# Patient Record
Sex: Female | Born: 1975 | Race: White | Hispanic: No | Marital: Married | Smoking: Former smoker
Health system: Southern US, Community
[De-identification: ages and names within clinical notes are randomized; demographics above are authoritative.]

## PROBLEM LIST (undated history)

## (undated) DIAGNOSIS — G43109 Migraine with aura, not intractable, without status migrainosus: Secondary | ICD-10-CM

## (undated) DIAGNOSIS — E669 Obesity, unspecified: Secondary | ICD-10-CM

## (undated) DIAGNOSIS — M1990 Unspecified osteoarthritis, unspecified site: Secondary | ICD-10-CM

## (undated) DIAGNOSIS — K219 Gastro-esophageal reflux disease without esophagitis: Secondary | ICD-10-CM

## (undated) DIAGNOSIS — D229 Melanocytic nevi, unspecified: Secondary | ICD-10-CM

## (undated) DIAGNOSIS — F329 Major depressive disorder, single episode, unspecified: Secondary | ICD-10-CM

## (undated) DIAGNOSIS — M170 Bilateral primary osteoarthritis of knee: Secondary | ICD-10-CM

## (undated) DIAGNOSIS — F32A Depression, unspecified: Secondary | ICD-10-CM

## (undated) DIAGNOSIS — R87619 Unspecified abnormal cytological findings in specimens from cervix uteri: Secondary | ICD-10-CM

## (undated) DIAGNOSIS — IMO0002 Reserved for concepts with insufficient information to code with codable children: Secondary | ICD-10-CM

## (undated) DIAGNOSIS — L988 Other specified disorders of the skin and subcutaneous tissue: Secondary | ICD-10-CM

## (undated) DIAGNOSIS — Z1589 Genetic susceptibility to other disease: Secondary | ICD-10-CM

## (undated) DIAGNOSIS — D369 Benign neoplasm, unspecified site: Secondary | ICD-10-CM

## (undated) HISTORY — DX: Bilateral primary osteoarthritis of knee: M17.0

## (undated) HISTORY — DX: Major depressive disorder, single episode, unspecified: F32.9

## (undated) HISTORY — DX: Reserved for concepts with insufficient information to code with codable children: IMO0002

## (undated) HISTORY — DX: Benign neoplasm, unspecified site: D36.9

## (undated) HISTORY — DX: Obesity, unspecified: E66.9

## (undated) HISTORY — DX: Depression, unspecified: F32.A

## (undated) HISTORY — DX: Other specified disorders of the skin and subcutaneous tissue: L98.8

## (undated) HISTORY — DX: Migraine with aura, not intractable, without status migrainosus: G43.109

## (undated) HISTORY — PX: OTHER SURGICAL HISTORY: SHX169

## (undated) HISTORY — PX: ORIF FINGER FRACTURE: SHX2122

## (undated) HISTORY — DX: Genetic susceptibility to other disease: Z15.89

## (undated) HISTORY — DX: Unspecified osteoarthritis, unspecified site: M19.90

## (undated) HISTORY — PX: TUBAL LIGATION: SHX77

## (undated) HISTORY — PX: COLONOSCOPY: SHX174

## (undated) HISTORY — DX: Unspecified abnormal cytological findings in specimens from cervix uteri: R87.619

---
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
## 2006-08-22 HISTORY — PX: CHOLECYSTECTOMY: SHX55

## 2009-05-12 DIAGNOSIS — D369 Benign neoplasm, unspecified site: Secondary | ICD-10-CM

## 2009-05-12 DIAGNOSIS — F53 Postpartum depression: Secondary | ICD-10-CM | POA: Insufficient documentation

## 2009-05-12 HISTORY — DX: Benign neoplasm, unspecified site: D36.9

## 2009-10-05 DIAGNOSIS — G5600 Carpal tunnel syndrome, unspecified upper limb: Secondary | ICD-10-CM | POA: Insufficient documentation

## 2009-10-05 DIAGNOSIS — D229 Melanocytic nevi, unspecified: Secondary | ICD-10-CM | POA: Insufficient documentation

## 2009-10-05 DIAGNOSIS — F329 Major depressive disorder, single episode, unspecified: Secondary | ICD-10-CM | POA: Insufficient documentation

## 2009-10-05 DIAGNOSIS — Z833 Family history of diabetes mellitus: Secondary | ICD-10-CM | POA: Insufficient documentation

## 2009-10-05 HISTORY — DX: Carpal tunnel syndrome, unspecified upper limb: G56.00

## 2010-08-22 DIAGNOSIS — G43109 Migraine with aura, not intractable, without status migrainosus: Secondary | ICD-10-CM

## 2010-08-22 HISTORY — DX: Migraine with aura, not intractable, without status migrainosus: G43.109

## 2010-11-11 DIAGNOSIS — G43909 Migraine, unspecified, not intractable, without status migrainosus: Secondary | ICD-10-CM | POA: Insufficient documentation

## 2010-12-23 DIAGNOSIS — R3915 Urgency of urination: Secondary | ICD-10-CM | POA: Insufficient documentation

## 2010-12-23 DIAGNOSIS — N8112 Cystocele, lateral: Secondary | ICD-10-CM | POA: Insufficient documentation

## 2010-12-23 DIAGNOSIS — R350 Frequency of micturition: Secondary | ICD-10-CM | POA: Insufficient documentation

## 2011-09-23 HISTORY — PX: OTHER SURGICAL HISTORY: SHX169

## 2013-12-26 ENCOUNTER — Ambulatory Visit (INDEPENDENT_AMBULATORY_CARE_PROVIDER_SITE_OTHER): Payer: PRIVATE HEALTH INSURANCE | Admitting: Family Medicine

## 2013-12-26 ENCOUNTER — Encounter: Payer: Self-pay | Admitting: Family Medicine

## 2013-12-26 VITALS — BP 102/70 | HR 80 | Temp 98.8°F | Ht 65.25 in | Wt 239.0 lb

## 2013-12-26 DIAGNOSIS — IMO0002 Reserved for concepts with insufficient information to code with codable children: Secondary | ICD-10-CM

## 2013-12-26 DIAGNOSIS — M25561 Pain in right knee: Secondary | ICD-10-CM

## 2013-12-26 DIAGNOSIS — M179 Osteoarthritis of knee, unspecified: Secondary | ICD-10-CM

## 2013-12-26 DIAGNOSIS — M25569 Pain in unspecified knee: Secondary | ICD-10-CM

## 2013-12-26 DIAGNOSIS — M1710 Unilateral primary osteoarthritis, unspecified knee: Secondary | ICD-10-CM

## 2013-12-26 NOTE — Progress Notes (Signed)
No chief complaint on file.   HPI:  Diane Scott is here to establish care.  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 OA (osteoarthritis) of knee 12/26/2013    Health Maintenance: -UTD on all vaccines, will schedule with gyn for pap ROS: See pertinent positives and negatives per HPI.  Past Medical History  Diagnosis Date  . Dysplasia of skin   . Depression   . Frequent headaches     Family History  Problem Relation Age of Onset  . Arthritis Mother   . Arthritis Father   . Hyperlipidemia Father   . Hypertension Father   . Diabetes Father   . Cancer Maternal Aunt     melanoma  . Cancer Paternal Uncle     melanoma  . Arthritis Maternal Grandmother   . Alcohol abuse Maternal Grandfather   . Arthritis Paternal Grandmother   . Cancer Paternal Grandmother     mealnoma  . Alcohol abuse Paternal Grandfather     History   Social History  . Marital Status: Married    Spouse Name: N/A    Number of Children: N/A  . Years of Education: N/A   Social History Main Topics  . Smoking status: Never Smoker   . Smokeless tobacco: None  . Alcohol Use: None  . Drug Use: None  . Sexual Activity: None   Other Topics Concern  . None   Social History Narrative   Work or School: NP heaptology      Home Situation: lives with husband and 2 children      Spiritual Beliefs: none      Lifestyle: elliptical; working on diet             Current outpatient prescriptions:Ibuprofen (MOTRIN PO), Take by mouth., Disp: , Rfl:    EXAM:  Filed Vitals:   12/26/13 1633  BP: 102/70  Pulse: 80  Temp: 98.8 F (37.1 C)    Body mass index is 39.48 kg/(m^2).  GENERAL: vitals reviewed and listed above, alert, oriented, appears well hydrated and in no acute distress  HEENT: atraumatic, conjunttiva clear, no obvious abnormalities on inspection of external nose and ears  NECK: no obvious masses on inspection  LUNGS: clear to auscultation bilaterally, no wheezes, rales or rhonchi, good air movement  CV: HRRR, no peripheral edema  MS: moves all extremities without noticeable abnormality Normal gait, normal knee exam except patellar crepitus and medial and lat joint line tenderness.  PSYCH: pleasant and cooperative, no obvious depression or anxiety  ASSESSMENT AND PLAN:  Discussed the following assessment and plan:  Right knee pain - Plan: Ambulatory referral to Orthopedic Surgery  OA (osteoarthritis) of knee  -We reviewed the PMH, PSH, FH, SH, Meds and Allergies. -We provided refills for any medications we will prescribe as needed. -We addressed current concerns per orders and patient instructions. -We have asked for records for pertinent exams, studies, vaccines and notes from previous providers. -We have advised patient to follow up per instructions below.   -Patient advised to  return or notify a doctor immediately if symptoms worsen or persist or new concerns arise.  Patient Instructions  -yearly skin check with dermatologist  -We placed a referral for you as discussed to the orthopedic doctor. It usually takes about 1-2 weeks to process and schedule this referral. If you have not heard from [place] regarding this appointment in 2 weeks please contact our office.  We recommend the following healthy lifestyle measures: - eat a healthy diet consisting of lots of vegetables, fruits, beans, nuts, seeds, healthy meats such as white chicken and fish and whole grains.  - avoid fried foods, fast food, processed  foods, sodas, red meet and other fattening foods.  - get a least 150 minutes of aerobic exercise per week.    Follow up in: as needed and yearly      Lucretia Kern

## 2013-12-26 NOTE — Progress Notes (Signed)
Pre visit review using our clinic review tool, if applicable. No additional management support is needed unless otherwise documented below in the visit note. 

## 2013-12-26 NOTE — Patient Instructions (Addendum)
-  yearly skin check with dermatologist  -We placed a referral for you as discussed to the orthopedic doctor. It usually takes about 1-2 weeks to process and schedule this referral. If you have not heard from [place] regarding this appointment in 2 weeks please contact our office.  We recommend the following healthy lifestyle measures: - eat a healthy diet consisting of lots of vegetables, fruits, beans, nuts, seeds, healthy meats such as white chicken and fish and whole grains.  - avoid fried foods, fast food, processed foods, sodas, red meet and other fattening foods.  - get a least 150 minutes of aerobic exercise per week.    Follow up in: as needed and yearly

## 2014-10-23 ENCOUNTER — Encounter: Payer: Self-pay | Admitting: Certified Nurse Midwife

## 2014-10-23 ENCOUNTER — Ambulatory Visit (INDEPENDENT_AMBULATORY_CARE_PROVIDER_SITE_OTHER): Payer: No Typology Code available for payment source | Admitting: Certified Nurse Midwife

## 2014-10-23 VITALS — BP 100/64 | HR 72 | Resp 16 | Ht 65.0 in | Wt 244.0 lb

## 2014-10-23 DIAGNOSIS — B3731 Acute candidiasis of vulva and vagina: Secondary | ICD-10-CM

## 2014-10-23 DIAGNOSIS — N938 Other specified abnormal uterine and vaginal bleeding: Secondary | ICD-10-CM | POA: Diagnosis not present

## 2014-10-23 DIAGNOSIS — Z124 Encounter for screening for malignant neoplasm of cervix: Secondary | ICD-10-CM | POA: Diagnosis not present

## 2014-10-23 DIAGNOSIS — B373 Candidiasis of vulva and vagina: Secondary | ICD-10-CM | POA: Diagnosis not present

## 2014-10-23 DIAGNOSIS — Z87898 Personal history of other specified conditions: Secondary | ICD-10-CM | POA: Diagnosis not present

## 2014-10-23 DIAGNOSIS — N762 Acute vulvitis: Secondary | ICD-10-CM | POA: Diagnosis not present

## 2014-10-23 DIAGNOSIS — Z01419 Encounter for gynecological examination (general) (routine) without abnormal findings: Secondary | ICD-10-CM | POA: Diagnosis not present

## 2014-10-23 MED ORDER — FLUCONAZOLE 150 MG PO TABS: ORAL_TABLET | ORAL | Status: DC

## 2014-10-23 MED ORDER — NYSTATIN-TRIAMCINOLONE 100000-0.1 UNIT/GM-% EX CREA: 1.0000 "application " | TOPICAL_CREAM | Freq: Two times a day (BID) | CUTANEOUS | Status: DC

## 2014-10-23 NOTE — Progress Notes (Signed)
39 y.o. Married  Caucasian Fe g2 p2002 here to establish gyn and  for annual exam. Contraception BTL 2010. Has chronic history of DUB with menorrhagia, amenorrhea episodes and occasional regular periods. HTA done in 2013 with amenorrhea x 2 months then return to same pattern of cycles, with prolonged bleeding. 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 No aex since ablation.  Mother recent history of stroke (40). Sees Dr.Kim for aex/labs. No other health issues today other than DUB which has been chronic. Patient brought previous PUS done 07/12/11 which was normal which has increased endometrium, this was prior to ablation. Last period lasted 18 days, patient has used OCP, Nuvaring and POP with out success, declines IUD or Nexplanon information. Just would like to consider Hysterectomy option. Also complaining of vaginal itching external and internal for 2-3 days. No new personal products or other concerns. Please check.  Patient's last menstrual period was 09/26/2014.          Sexually active: Yes.    The current method of family planning is tubal ligation.    Exercising: Yes.    walking/21 day fix Smoker:  no  Health Maintenance: Pap:  11/12 neg MMG:  none Colonoscopy:  2008 polyps, per patient due now BMD:   none TDaP:  2013 Labs: Hgb-13.9 Self breast exam: done monthly   reports that she has quit smoking. She does not have any smokeless tobacco history on file. She reports that she does not drink alcohol or use illicit drugs.  Past Medical History  Diagnosis Date  . Dysplasia of skin   . Depression   . Frequent headaches   . Migraines     numbness on side of face with them  . Cystocele   . Arthritis     Past Surgical History  Procedure Laterality Date  . Cholecystectomy  2008  . Tubal ligation    . Uterine ablation    . Orif finger fracture      Current Outpatient Prescriptions  Medication Sig Dispense Refill  . Ibuprofen (MOTRIN PO) Take by mouth.     No current  facility-administered medications for this visit.    Family History  Problem Relation Age of Onset  . Arthritis Mother   . Hypertension Mother   . Hyperlipidemia Mother   . Stroke Mother   . Arthritis Father   . Hyperlipidemia Father   . Hypertension Father   . Diabetes Father   . Cancer Maternal Aunt     melanoma  . Cancer Paternal Uncle     melanoma  . Arthritis Maternal Grandmother   . Alcohol abuse Maternal Grandfather   . Squamous cell carcinoma Maternal Grandfather   . Arthritis Paternal Grandmother   . Cancer Paternal Grandmother     melanoma  . Alcohol abuse Paternal Grandfather   . Squamous cell carcinoma Maternal Aunt   . Colon cancer Paternal Uncle   . Liver cancer Paternal Uncle     ROS:  Pertinent items are noted in HPI.  Otherwise, a comprehensive ROS was negative.  Exam:   BP 100/64 mmHg  Pulse 72  Resp 16  Ht 5' 4.25" (1.632 m)  Wt 244 lb (110.678 kg)  BMI 41.55 kg/m2  LMP 09/26/2014 Height: 5' 4.25" (163.2 cm) Ht Readings from Last 3 Encounters:  10/23/14 5' 4.25" (1.632 m)  12/26/13 5' 5.25" (1.657 m)    General appearance: alert, cooperative and appears stated age Head: Normocephalic, without obvious abnormality, atraumatic Neck: no adenopathy, supple, symmetrical,  trachea midline and thyroid normal to inspection and palpation Lungs: clear to auscultation bilaterally Breasts: normal appearance, no masses or tenderness, No nipple retraction or dimpling, No nipple discharge or bleeding, No axillary or supraclavicular adenopathy Heart: regular rate and rhythm Abdomen: soft, non-tender; no masses,  no organomegaly Extremities: extremities normal, atraumatic, no cyanosis or edema Skin: Skin color, texture, turgor normal. No rashes or lesions Lymph nodes: Cervical, supraclavicular, and axillary nodes normal. No abnormal inguinal nodes palpated Neurologic: Grossly normal   Pelvic: External genitalia:  no lesions, normal female, increased pink with  slight scaling and exudate wet prep taken              Urethra:  normal appearing urethra with no masses, tenderness or lesions              Bartholin's and Skene's: normal                 Vagina: normal appearing vagina with normal color and white thick non odorous discharge, no lesions, grade 1-2 cystocele noted ph 4.0 wet prep taken              Cervix: normal, non tender, no blood noted, no lesions              Pap taken: Yes.   Bimanual Exam:  Uterus:  normal size, contour, position, consistency, mobility, non-tender and retroverted              Adnexa: normal adnexa and no mass, fullness, tenderness               Rectovaginal: Confirms               Anus:  normal sphincter tone, no lesions  Wet prep positive for yeast  Chaperone present: Yes  A:  Well Woman with normal exam  Contraception BTL  History of DUB chronic with menorrhagia, previous HTA 2012 with minimal change, prior PUS negative for abnormality, menses cycle every 16-18 days with small to heavy bleeding, Hgb. 13.9, Hormonal control not well tolerated, not comfortable with IUD, interested in hysterectomy option. Discussed fibroid or polyp and increase in weight occurrence which can cause bleeding profile. Recommend PUS possible SHG and endo biopsy to evaluate. Patient agreeable for evaluation. Discussed with patient she will be called with insurance information and then scheduled.  Cystocele not symptomatic  Yeast vaginitis/vulvitis  History of nipple discharge,none on exam  History of Migraine with aura  History of skin dysplasia with mole removal on left ankle  Strong family history Paternal/maternal of melanoma    P:   Reviewed health and wellness pertinent to exam  Discussed fibroid or polyp and increase in weight occurrence which can cause bleeding profile. Recommend PUS possible SHG and endo biopsy to evaluate. Patient agreeable for evaluation. Discussed with patient she will be called with insurance information and  then scheduled.   Aware of cystocele and has had previous pelvic floor therapy with good response, and continues with exercises to control. No incontinence.  Reviewed findings of yeast and etiology. Discussed avoiding moist clothing for long periods of time.   Rx Diflucan see order  Rx Mycolog see order with instructions  Patient needs to notify if sees discharge again, she relates she usually has to milk nipple to see. Encouraged not to milk nipple. Declines mammogram at present.   Lab Prolactin   Patient aware of warning signs with Migraine aura.  Stressed sun protection and sunscreen and yearly skin checks with dermatology.  Pap smear taken today with HPVHR   counseled on breast self exam, mammography screening, adequate intake of calcium and vitamin D, diet and exercise, Kegel's exercises  return annually or prn  An After Visit Summary was printed and given to the patient.

## 2014-10-23 NOTE — Patient Instructions (Signed)
EXERCISE AND DIET:  We recommended that you start or continue a regular exercise program for good health. Regular exercise means any activity that makes your heart beat faster and makes you sweat.  We recommend exercising at least 30 minutes per day at least 3 days a week, preferably 4 or 5.  We also recommend a diet low in fat and sugar.  Inactivity, poor dietary choices and obesity can cause diabetes, heart attack, stroke, and kidney damage, among others.    ALCOHOL AND SMOKING:  Women should limit their alcohol intake to no more than 7 drinks/beers/glasses of wine (combined, not each!) per week. Moderation of alcohol intake to this level decreases your risk of breast cancer and liver damage. And of course, no recreational drugs are part of a healthy lifestyle.  And absolutely no smoking or even second hand smoke. Most people know smoking can cause heart and lung diseases, but did you know it also contributes to weakening of your bones? Aging of your skin?  Yellowing of your teeth and nails?  CALCIUM AND VITAMIN D:  Adequate intake of calcium and Vitamin D are recommended.  The recommendations for exact amounts of these supplements seem to change often, but generally speaking 600 mg of calcium (either carbonate or citrate) and 800 units of Vitamin D per day seems prudent. Certain women may benefit from higher intake of Vitamin D.  If you are among these women, your doctor will have told you during your visit.    PAP SMEARS:  Pap smears, to check for cervical cancer or precancers,  have traditionally been done yearly, although recent scientific advances have shown that most women can have pap smears less often.  However, every woman still should have a physical exam from her gynecologist every year. It will include a breast check, inspection of the vulva and vagina to check for abnormal growths or skin changes, a visual exam of the cervix, and then an exam to evaluate the size and shape of the uterus and  ovaries.  And after 40 years of age, a rectal exam is indicated to check for rectal cancers. We will also provide age appropriate advice regarding health maintenance, like when you should have certain vaccines, screening for sexually transmitted diseases, bone density testing, colonoscopy, mammograms, etc.   MAMMOGRAMS:  All women over 40 years old should have a yearly mammogram. Many facilities now offer a "3D" mammogram, which may cost around $50 extra out of pocket. If possible,  we recommend you accept the option to have the 3D mammogram performed.  It both reduces the number of women who will be called back for extra views which then turn out to be normal, and it is better than the routine mammogram at detecting truly abnormal areas.    COLONOSCOPY:  Colonoscopy to screen for colon cancer is recommended for all women at age 50.  We know, you hate the idea of the prep.  We agree, BUT, having colon cancer and not knowing it is worse!!  Colon cancer so often starts as a polyp that can be seen and removed at colonscopy, which can quite literally save your life!  And if your first colonoscopy is normal and you have no family history of colon cancer, most women don't have to have it again for 10 years.  Once every ten years, you can do something that may end up saving your life, right?  We will be happy to help you get it scheduled when you are ready.    Be sure to check your insurance coverage so you understand how much it will cost.  It may be covered as a preventative service at no cost, but you should check your particular policy.     It was great to meet you today! DebbiMonilial Vaginitis Vaginitis in a soreness, swelling and redness (inflammation) of the vagina and vulva. Monilial vaginitis is not a sexually transmitted infection. CAUSES  Yeast vaginitis is caused by yeast (candida) that is normally found in your vagina. With a yeast infection, the candida has overgrown in number to a point that upsets  the chemical balance. SYMPTOMS   White, thick vaginal discharge.  Swelling, itching, redness and irritation of the vagina and possibly the lips of the vagina (vulva).  Burning or painful urination.  Painful intercourse. DIAGNOSIS  Things that may contribute to monilial vaginitis are:  Postmenopausal and virginal states.  Pregnancy.  Infections.  Being tired, sick or stressed, especially if you had monilial vaginitis in the past.  Diabetes. Good control will help lower the chance.  Birth control pills.  Tight fitting garments.  Using bubble bath, feminine sprays, douches or deodorant tampons.  Taking certain medications that kill germs (antibiotics).  Sporadic recurrence can occur if you become ill. TREATMENT  Your caregiver will give you medication.  There are several kinds of anti monilial vaginal creams and suppositories specific for monilial vaginitis. For recurrent yeast infections, use a suppository or cream in the vagina 2 times a week, or as directed.  Anti-monilial or steroid cream for the itching or irritation of the vulva may also be used. Get your caregiver's permission.  Painting the vagina with methylene blue solution may help if the monilial cream does not work.  Eating yogurt may help prevent monilial vaginitis. HOME CARE INSTRUCTIONS   Finish all medication as prescribed.  Do not have sex until treatment is completed or after your caregiver tells you it is okay.  Take warm sitz baths.  Do not douche.  Do not use tampons, especially scented ones.  Wear cotton underwear.  Avoid tight pants and panty hose.  Tell your sexual partner that you have a yeast infection. They should go to their caregiver if they have symptoms such as mild rash or itching.  Your sexual partner should be treated as well if your infection is difficult to eliminate.  Practice safer sex. Use condoms.  Some vaginal medications cause latex condoms to fail. Vaginal  medications that harm condoms are:  Cleocin cream.  Butoconazole (Femstat).  Terconazole (Terazol) vaginal suppository.  Miconazole (Monistat) (may be purchased over the counter). SEEK MEDICAL CARE IF:   You have a temperature by mouth above 102 F (38.9 C).  The infection is getting worse after 2 days of treatment.  The infection is not getting better after 3 days of treatment.  You develop blisters in or around your vagina.  You develop vaginal bleeding, and it is not your menstrual period.  You have pain when you urinate.  You develop intestinal problems.  You have pain with sexual intercourse. Document Released: 05/18/2005 Document Revised: 10/31/2011 Document Reviewed: 01/30/2009 [facility] Patient Information 2015 [place], [place]. This information is not intended to replace advice given to you by your health care provider. Make sure you discuss any questions you have with your health care provider.

## 2014-10-24 LAB — PROLACTIN: PROLACTIN: 9.4 ng/mL

## 2014-10-27 LAB — IPS PAP TEST WITH HPV

## 2014-10-28 NOTE — Progress Notes (Signed)
Agree with [facility] and possible endometrial biopsy.  Orders placed.  Reviewed personally.  Felipa Emory, MD.

## 2014-10-30 ENCOUNTER — Telehealth: Payer: Self-pay | Admitting: Obstetrics & Gynecology

## 2014-10-30 NOTE — Telephone Encounter (Signed)
Call to patient. Advised of benefit quote received for SHGM/EMB.  Patient agreeable. Scheduled procedures. Advised patient of 72 hour cancellation policy and $921 cancellation fee. Patient agreeable.

## 2014-11-13 ENCOUNTER — Ambulatory Visit (INDEPENDENT_AMBULATORY_CARE_PROVIDER_SITE_OTHER): Payer: No Typology Code available for payment source | Admitting: Obstetrics & Gynecology

## 2014-11-13 ENCOUNTER — Other Ambulatory Visit: Payer: Self-pay | Admitting: Obstetrics & Gynecology

## 2014-11-13 ENCOUNTER — Ambulatory Visit (INDEPENDENT_AMBULATORY_CARE_PROVIDER_SITE_OTHER): Payer: No Typology Code available for payment source

## 2014-11-13 VITALS — BP 118/82 | Wt 247.0 lb

## 2014-11-13 DIAGNOSIS — N938 Other specified abnormal uterine and vaginal bleeding: Secondary | ICD-10-CM | POA: Diagnosis not present

## 2014-11-13 DIAGNOSIS — N921 Excessive and frequent menstruation with irregular cycle: Secondary | ICD-10-CM | POA: Diagnosis not present

## 2014-11-13 DIAGNOSIS — N393 Stress incontinence (female) (male): Secondary | ICD-10-CM | POA: Diagnosis not present

## 2014-11-13 NOTE — Progress Notes (Signed)
39 y.o. Diane Scott here for a pelvic ultrasound due to long hx of DUB, failed HTA performed 2014 in [place], increased dysmenorrhea since endometrial ablation was performed.  Pt is just sick and tired of bleeding and really wants to discuss definitive treatment.  Has been on OCPs in the past including combination OCPs, nuvaring, and POP without success.  She isn't really interested in Mirena IUD or Nexplanon either.  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  Has supervising physicians who are in her office once every other week and review charts.  Pt does have some urinary incontinence as well as prolapse from deliveries.  She would like this repaired at some time in the future but does not want anything else right now except to fix the bleeding.  Also, as she is in the process of growing practice, she does not feel she can be out for extended recovery.  D/W pt the importance of being able to plan for whatever might occur with surgery.  She is aware there are risks but considering "typical surgery and typical recovery", she is interested in whatever can get her back to work the fastest.   Patient's last menstrual period was 09/26/2014.  Sexually active:  yes  Contraception: bilateral tubal ligation   Technique:  Both transabdominal and transvaginal ultrasound examinations of the pelvis were performed. Transabdominal technique was performed for global imaging of the pelvis including uterus, ovaries, adnexal regions, and pelvic cul-de-sac.  It was necessary to proceed with endovaginal exam following the abdominal ultrasound transabdominal exam to visualize the endometrium and adnexa.  Color and duplex Doppler ultrasound was utilized to evaluate blood flow to the ovaries.  FINDINGS: UTERUS: 7.8 x 6.3 x 4.5cm EMS: 7.33mm ADNEXA:   Left ovary 3.1 x 2.2 x 2.1cm   Right ovary 2.5 x 1.7 x 2.0cm CUL DE SAC: no free fluid  SHSG:  After obtaining appropriate verbal consent from patient, the  cervix was visualized using a speculum, and prepped with betadine.  A tenaculum  was applied to the cervix.  Dilation of the cervix was not necessary. The catheter was passed into the uterus and sterile saline introduced, with the following findings: no intracavity lesions.  Cavity opened easily.  Endometrial biopsy recommended. Verbal and written consent obtained.  Speculum placed.  Cervix visualized and cleansed with betadine prep.  A single toothed tenaculum was applied to the anterior lip of the cervix.  Endometrial pipelle was advanced through the cervix into the endometrial cavity without difficulty.  Pipelle passed to 8cm.  Suction applied and pipelle removed with good tissue sample obtained.  Tenculum removed.  No bleeding noted.  Patient tolerated procedure well.  All instruments removed.  Assessment:  DUB, long hx, with failed HTA, OCPs, Nuva ring, POPs Desirous of definitive treatment SUI Cystocele No hx of anemia  Plan:  Pt will speak to MDs at her office for planning purposes.  Aware my OR days are Mondays.  Pt wants to proceed with hysterectomy only.  Robotic approach will be considered.  Bilateral salpingectomy with keeping ovaries discussed.  Pt comfortable with all of this.  Finally, did encourage pt to consider other treatment for bladder issues.  Declines this at this time.  Biopsy pending.  Results will be called to pt.  ~30 minutes spent with patient >50% of time was in face to face discussion of above.

## 2014-11-20 ENCOUNTER — Encounter: Payer: Self-pay | Admitting: Obstetrics & Gynecology

## 2014-11-20 DIAGNOSIS — N393 Stress incontinence (female) (male): Secondary | ICD-10-CM | POA: Insufficient documentation

## 2014-11-20 DIAGNOSIS — N938 Other specified abnormal uterine and vaginal bleeding: Secondary | ICD-10-CM | POA: Insufficient documentation

## 2014-11-20 DIAGNOSIS — N921 Excessive and frequent menstruation with irregular cycle: Secondary | ICD-10-CM | POA: Insufficient documentation

## 2014-11-24 ENCOUNTER — Telehealth: Payer: Self-pay | Admitting: Emergency Medicine

## 2014-11-24 NOTE — Telephone Encounter (Signed)
Message left to return call to Sheika Coutts at [phone].    

## 2014-11-24 NOTE — Telephone Encounter (Signed)
-----   Message from Megan Salon, MD sent at 11/20/2014 12:39 PM EDT ----- Please inform endometrial biopsy was negative.  Has pt decided about possible surgical dates?

## 2014-11-24 NOTE — Telephone Encounter (Signed)
Call to patient and message from Dr. Sabra Heck given. Patient verbalized understanding.  Patient not sure about dates at this point, states she is meeting with her physicians on Thursday and will call back after to speak with Lamont Snowball, RN.   Routing to provider for final review. Patient agreeable to disposition. Will close encounter

## 2014-11-24 NOTE — Telephone Encounter (Signed)
Returning call.

## 2014-11-25 ENCOUNTER — Telehealth: Payer: Self-pay | Admitting: Obstetrics & Gynecology

## 2014-11-25 NOTE — Telephone Encounter (Signed)
Pt is calling to schedule surgery with Dr Sabra Heck.

## 2014-11-25 NOTE — Telephone Encounter (Signed)
Spoke with patient. Advised of benefit quote received for the surgeon portion of her surgery. Advised that payment is due in full at least 3 weeks prior to the scheduled surgery date. Patient agreeable. Advised patient that she will receive separate communication from the hospital regarding facility charges/fees/payment.  Advised that she will be hearing from [facility] regarding scheduling.  Patient considering being scheduled either late May or early June.  I provided patient with contact information for the [facility] and CPT codes so that she can receive an estimate of facility charges.

## 2014-11-26 NOTE — Telephone Encounter (Signed)
Spoke with patient. She is asking for late May or early June date. Specifically requests 01/12/15, if available due to coverage for work.  Advised patient Lamont Snowball, RN would contact pt to discuss date and schedule per her request. Patient agreeable.

## 2014-11-26 NOTE — Telephone Encounter (Signed)
Patient calling requesting to speak with Gay Filler to schedule surgery.

## 2014-11-27 NOTE — Telephone Encounter (Signed)
Return call to patient. Discussed available date options and scheduling policies. Patient prefers 01-12-15 and 2nd choice is 02-16-15. Advised  Will check scheduling options and let her know.

## 2014-11-28 NOTE — Telephone Encounter (Signed)
Call to patient. Advised surgery scheduled for 01-12-15 at 86 at [facility]. Surgical instruction sheet reviewed and printed copy mailed to patient. See scanned copy. Patient will fax FMLA forms to office. Would like to return to work on a part time basis 3 weeks post op and full time on 02-02-15.  Routing to provider for final review. Patient agreeable to disposition. Will close encounter

## 2014-12-02 ENCOUNTER — Telehealth: Payer: Self-pay | Admitting: *Deleted

## 2014-12-02 NOTE — Telephone Encounter (Signed)
Call to patient to schedule surgery consult and post op appointments. Patient agreeable to date and times.  Routing to provider for final review. Patient agreeable to disposition. Will close encounter.

## 2014-12-17 DIAGNOSIS — Z0289 Encounter for other administrative examinations: Secondary | ICD-10-CM

## 2015-01-02 ENCOUNTER — Ambulatory Visit (INDEPENDENT_AMBULATORY_CARE_PROVIDER_SITE_OTHER): Payer: No Typology Code available for payment source | Admitting: Obstetrics & Gynecology

## 2015-01-02 VITALS — BP 122/72 | HR 60 | Resp 25 | Wt 251.0 lb

## 2015-01-02 DIAGNOSIS — N946 Dysmenorrhea, unspecified: Secondary | ICD-10-CM

## 2015-01-02 DIAGNOSIS — N921 Excessive and frequent menstruation with irregular cycle: Secondary | ICD-10-CM

## 2015-01-02 DIAGNOSIS — N938 Other specified abnormal uterine and vaginal bleeding: Secondary | ICD-10-CM

## 2015-01-02 MED ORDER — OXYCODONE-ACETAMINOPHEN 5-325 MG PO TABS: 2.0000 | ORAL_TABLET | ORAL | Status: DC | PRN

## 2015-01-02 MED ORDER — IBUPROFEN 800 MG PO TABS: 800.0000 mg | ORAL_TABLET | Freq: Three times a day (TID) | ORAL | Status: DC | PRN

## 2015-01-02 NOTE — Progress Notes (Signed)
39 y.o. [id number] MarriedCaucasian female here for discussion of upcoming procedure.  Robotic Assisted Hystrectomy planned due to DUB and failed endometrial ablation done in [place].  Since the procedure, her pain has worsened and she is ready for definitive treatment.  She has been counseled about alternative therapies and declines.  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  She does know this could worsen and is ok with that.  He big issue is to stop the bleeding and pain.  Pre-op evaluation thus far has included PUS done 11/13/14 showing uterus 8 x 6.3 x 4.5cm.  No fibroids noted.  Normal ovaries noted.  Endometrial biopsy was negative for abnormal cells.  Procedure discussed with patient.  Hospital stay, recovery and pain management all discussed.  Risks discussed including but not limited to bleeding, 1% risk of receiving a  transfusion, infection, 3-4% risk of bowel/bladder/ureteral/vascular injury discussed as well as possible need for additional surgery if injury does occur discussed.  DVT/PE and rare risk of death discussed.  My actual complications with prior surgeries discussed.  Vaginal cuff dehiscence discussed.  Hernia formation discussed.  Positioning and incision locations discussed.  Patient aware if pathology abnormal she may need additional treatment.  All questions answered.    Ob Hx:   Patient's last menstrual period was 12/24/2014.          Sexually active: Yes.   Birth control: bilateral tubal ligation Last pap: 10/23/14 WNL/negative HR HPV Last MMG: never Tobacco: quit 14 years ago  Past Surgical History  Procedure Laterality Date  . Cholecystectomy  2008  . Tubal ligation    . Uterine ablation  2/13  . Orif finger fracture    . Endometrial biopsy      neg per patient    Past Medical History  Diagnosis Date  . Dysplasia of skin   . Depression   . Frequent headaches   . Migraines     numbness on side of  face with them  . Cystocele   . Arthritis   . Migraine headache with aura 2012    Allergies: Review of patient's allergies indicates no known allergies.  Current Outpatient Prescriptions  Medication Sig Dispense Refill  . Famotidine (PEPCID PO) Take by mouth as needed.    . Multiple Vitamins-Minerals (MULTIVITAMIN PO) Take 1 tablet by mouth daily.    [name] ibuprofen (ADVIL,MOTRIN) 200 MG tablet Take 400 mg by mouth every 6 (six) hours as needed for moderate pain.     No current facility-administered medications for this visit.    ROS: A comprehensive review of systems was negative.  Exam:    BP 122/72 mmHg  Pulse 60  Resp 25  Wt 251 lb (113.853 kg)  LMP 12/24/2014  General appearance: alert and cooperative Head: Normocephalic, without obvious abnormality, atraumatic Neck: no adenopathy, supple, symmetrical, trachea midline and thyroid not enlarged, symmetric, no tenderness/mass/nodules Lungs: clear to auscultation bilaterally Heart: regular rate and rhythm, S1, S2 normal, no murmur, click, rub or gallop Abdomen: soft, non-tender; bowel sounds normal; no masses,  no organomegaly Extremities: extremities normal, atraumatic, no cyanosis or edema Skin: Skin color, texture, turgor normal. No rashes or lesions Lymph nodes: Cervical, supraclavicular, and axillary nodes normal. no inguinal nodes palpated Neurologic: Grossly normal  Pelvic: External genitalia:  no lesions              Urethra: normal appearing urethra with no masses, tenderness or lesions  Bartholins and Skenes: normal                 Vagina: normal appearing vagina with normal color and discharge, no lesions, second degree cystocele noted              Cervix: normal appearance              Pap taken: No.        Bimanual Exam:  Uterus:  uterus is normal size, shape, consistency and nontender                                      Adnexa:    normal adnexa in size, nontender and no masses                                       Rectovaginal: Deferred                                      Anus:  Exam deffered  A: DUB due to failed endometrial ablation  Cystocele with mild SUI Obesity   H/O anemia  P:  Robotic TLH/bilateral salpingectomy/possible BSO/cystoscopy planned.  Despite additional prolapse present, pt has clearly declined any additional surgery at this time and is well aware she may have increasing incontinence and is willing to accept that at this time. Rx for Motrin and Percocet given. Medications/Vitamins reviewed.  Pt knows needs to stop ASA products. Hysterectomy brochure given for pre and post op instructions.  ~25 minutes spent with patient >50% of time was in face to face discussion of above.

## 2015-01-07 ENCOUNTER — Encounter: Payer: Self-pay | Admitting: Obstetrics & Gynecology

## 2015-01-07 DIAGNOSIS — N946 Dysmenorrhea, unspecified: Secondary | ICD-10-CM | POA: Insufficient documentation

## 2015-01-08 ENCOUNTER — Encounter (HOSPITAL_COMMUNITY): Payer: Self-pay

## 2015-01-08 ENCOUNTER — Encounter (HOSPITAL_COMMUNITY)
Admission: RE | Admit: 2015-01-08 | Discharge: 2015-01-08 | Disposition: A | Discharge status: Home / Self Care | Payer: PRIVATE HEALTH INSURANCE | Source: Ambulatory Visit | Attending: Obstetrics & Gynecology | Admitting: Obstetrics & Gynecology

## 2015-01-08 DIAGNOSIS — Z01812 Encounter for preprocedural laboratory examination: Secondary | ICD-10-CM | POA: Insufficient documentation

## 2015-01-08 HISTORY — DX: Gastro-esophageal reflux disease without esophagitis: K21.9

## 2015-01-08 LAB — CBC
HCT: 38.5 % (ref 36.0–46.0)
HEMOGLOBIN: 13 g/dL (ref 12.0–15.0)
MCH: 29.4 pg (ref 26.0–34.0)
MCHC: 33.8 g/dL (ref 30.0–36.0)
MCV: 87.1 fL (ref 78.0–100.0)
Platelets: 238 10*3/uL (ref 150–400)
RBC: 4.42 MIL/uL (ref 3.87–5.11)
RDW: 12.9 % (ref 11.5–15.5)
WBC: 9.1 10*3/uL (ref 4.0–10.5)

## 2015-01-08 NOTE — Patient Instructions (Signed)
Your procedure is scheduled on: Jan 12, 2015  Enter through the Main Entrance of [facility] at: 10:00am   Pick up the phone at the desk and dial [phone].  Call this number if you have problems the morning of surgery: [phone].  Remember: Do NOT eat food:  Midnight on Sunday  Do NOT drink clear liquids after:  Midnight on Sunday  Take these medicines the morning of surgery with a SIP OF WATER: pepcid  Do NOT wear jewelry (body piercing), metal hair clips/bobby pins, make-up, or nail polish. Do NOT wear lotions, powders, or perfumes.  You may wear deoderant. Do NOT shave for 48 hours prior to surgery. Do NOT bring valuables to the hospital. Contacts, dentures, or bridgework may not be worn into surgery. Leave suitcase in car.  After surgery it may be brought to your room.  For patients admitted to the hospital, checkout time is 11:00 AM the day of discharge.

## 2015-01-11 MED ORDER — DEXTROSE 5 % IV SOLN
2.0000 g | INTRAVENOUS | Status: AC
  Administered 2015-01-12: 2 g via INTRAVENOUS
  Filled 2015-01-11: qty 2

## 2015-01-11 NOTE — Anesthesia Preprocedure Evaluation (Signed)
Anesthesia Evaluation  Patient identified by MRN, date of birth, ID band Patient awake    Reviewed: Allergy & Precautions, NPO status , Patient's Chart, lab work & pertinent test results  History of Anesthesia Complications Negative for: history of anesthetic complications  Airway Mallampati: II  TM Distance: >3 FB Neck ROM: Full    Dental no notable dental hx. (+) Dental Advisory Given   Pulmonary former smoker,  breath sounds clear to auscultation  Pulmonary exam normal       Cardiovascular negative cardio ROS Normal cardiovascular examRhythm:Regular Rate:Normal     Neuro/Psych  Headaches (with facial numbness), PSYCHIATRIC DISORDERS Anxiety Depression    GI/Hepatic Neg liver ROS, GERD-  Medicated and Controlled,  Endo/Other  Morbid obesity  Renal/GU negative Renal ROS  negative genitourinary   Musculoskeletal  (+) Arthritis -, Osteoarthritis,    Abdominal   Peds negative pediatric ROS (+)  Hematology negative hematology ROS (+)   Anesthesia Other Findings   Reproductive/Obstetrics negative OB ROS                             Anesthesia Physical Anesthesia Plan  ASA: III  Anesthesia Plan: General   Post-op Pain Management:    Induction: Intravenous  Airway Management Planned: Oral ETT  Additional Equipment:   Intra-op Plan:   Post-operative Plan: Extubation in OR  Informed Consent: I have reviewed the patients History and Physical, chart, labs and discussed the procedure including the risks, benefits and alternatives for the proposed anesthesia with the patient or authorized representative who has indicated his/her understanding and acceptance.   Dental advisory given  Plan Discussed with: CRNA  Anesthesia Plan Comments:         Anesthesia Quick Evaluation

## 2015-01-12 ENCOUNTER — Ambulatory Visit (HOSPITAL_COMMUNITY): Payer: No Typology Code available for payment source | Admitting: Anesthesiology

## 2015-01-12 ENCOUNTER — Ambulatory Visit (HOSPITAL_COMMUNITY)
Admission: RE | Admit: 2015-01-12 | Discharge: 2015-01-13 | Disposition: A | Discharge status: Home / Self Care | Payer: No Typology Code available for payment source | Source: Ambulatory Visit | Attending: Obstetrics & Gynecology | Admitting: Obstetrics & Gynecology

## 2015-01-12 ENCOUNTER — Encounter (HOSPITAL_COMMUNITY): Payer: Self-pay | Admitting: Emergency Medicine

## 2015-01-12 ENCOUNTER — Encounter (HOSPITAL_COMMUNITY)
Admission: RE | Disposition: A | Discharge status: Home / Self Care | Payer: Self-pay | Source: Ambulatory Visit | Attending: Obstetrics & Gynecology

## 2015-01-12 DIAGNOSIS — F329 Major depressive disorder, single episode, unspecified: Secondary | ICD-10-CM | POA: Insufficient documentation

## 2015-01-12 DIAGNOSIS — G43909 Migraine, unspecified, not intractable, without status migrainosus: Secondary | ICD-10-CM | POA: Insufficient documentation

## 2015-01-12 DIAGNOSIS — K219 Gastro-esophageal reflux disease without esophagitis: Secondary | ICD-10-CM | POA: Diagnosis not present

## 2015-01-12 DIAGNOSIS — N800 Endometriosis of uterus: Secondary | ICD-10-CM | POA: Insufficient documentation

## 2015-01-12 DIAGNOSIS — Z87891 Personal history of nicotine dependence: Secondary | ICD-10-CM | POA: Diagnosis not present

## 2015-01-12 DIAGNOSIS — Z9049 Acquired absence of other specified parts of digestive tract: Secondary | ICD-10-CM | POA: Insufficient documentation

## 2015-01-12 DIAGNOSIS — N888 Other specified noninflammatory disorders of cervix uteri: Secondary | ICD-10-CM | POA: Insufficient documentation

## 2015-01-12 DIAGNOSIS — N938 Other specified abnormal uterine and vaginal bleeding: Secondary | ICD-10-CM | POA: Diagnosis not present

## 2015-01-12 DIAGNOSIS — N920 Excessive and frequent menstruation with regular cycle: Secondary | ICD-10-CM | POA: Diagnosis present

## 2015-01-12 DIAGNOSIS — Z9851 Tubal ligation status: Secondary | ICD-10-CM | POA: Diagnosis not present

## 2015-01-12 DIAGNOSIS — N946 Dysmenorrhea, unspecified: Secondary | ICD-10-CM | POA: Diagnosis not present

## 2015-01-12 HISTORY — PX: BILATERAL SALPINGECTOMY: SHX5743

## 2015-01-12 HISTORY — PX: CYSTOSCOPY: SHX5120

## 2015-01-12 HISTORY — PX: ROBOTIC ASSISTED TOTAL HYSTERECTOMY: SHX6085

## 2015-01-12 LAB — PREGNANCY, URINE: PREG TEST UR: NEGATIVE

## 2015-01-12 SURGERY — ROBOTIC ASSISTED TOTAL HYSTERECTOMY
Anesthesia: General | Site: Urethra

## 2015-01-12 MED ORDER — MENTHOL 3 MG MT LOZG: 1.0000 | LOZENGE | OROMUCOSAL | Status: DC | PRN

## 2015-01-12 MED ORDER — KETOROLAC TROMETHAMINE 30 MG/ML IJ SOLN
INTRAMUSCULAR | Status: DC | PRN
  Administered 2015-01-12: 30 mg via INTRAVENOUS

## 2015-01-12 MED ORDER — DEXAMETHASONE SODIUM PHOSPHATE 10 MG/ML IJ SOLN
INTRAMUSCULAR | Status: DC | PRN
  Administered 2015-01-12: 10 mg via INTRAVENOUS

## 2015-01-12 MED ORDER — STERILE WATER FOR IRRIGATION IR SOLN
Status: DC | PRN
  Administered 2015-01-12: 1000 mL via INTRAVESICAL

## 2015-01-12 MED ORDER — OXYCODONE-ACETAMINOPHEN 5-325 MG PO TABS
1.0000 | ORAL_TABLET | ORAL | Status: DC | PRN
  Administered 2015-01-13 (×2): 1 via ORAL
  Filled 2015-01-12 (×2): qty 1

## 2015-01-12 MED ORDER — ROPIVACAINE HCL 5 MG/ML IJ SOLN
INTRAMUSCULAR | Status: AC
  Filled 2015-01-12: qty 30

## 2015-01-12 MED ORDER — LIDOCAINE HCL (CARDIAC) 20 MG/ML IV SOLN
INTRAVENOUS | Status: AC
  Filled 2015-01-12: qty 5

## 2015-01-12 MED ORDER — SIMETHICONE 80 MG PO CHEW: 80.0000 mg | CHEWABLE_TABLET | Freq: Four times a day (QID) | ORAL | Status: DC | PRN

## 2015-01-12 MED ORDER — SCOPOLAMINE 1 MG/3DAYS TD PT72
1.0000 | MEDICATED_PATCH | Freq: Once | TRANSDERMAL | Status: DC
  Administered 2015-01-12: 1.5 mg via TRANSDERMAL

## 2015-01-12 MED ORDER — KETOROLAC TROMETHAMINE 0.5 % OP SOLN
1.0000 [drp] | Freq: Four times a day (QID) | OPHTHALMIC | Status: DC
  Administered 2015-01-12 – 2015-01-13 (×3): 1 [drp] via OPHTHALMIC
  Filled 2015-01-12: qty 3

## 2015-01-12 MED ORDER — MORPHINE SULFATE 4 MG/ML IJ SOLN: 2.0000 mg | INTRAMUSCULAR | Status: DC | PRN

## 2015-01-12 MED ORDER — ALUM & MAG HYDROXIDE-SIMETH 200-200-20 MG/5ML PO SUSP: 30.0000 mL | ORAL | Status: DC | PRN

## 2015-01-12 MED ORDER — MIDAZOLAM HCL 2 MG/2ML IJ SOLN
INTRAMUSCULAR | Status: AC
  Filled 2015-01-12: qty 2

## 2015-01-12 MED ORDER — FENTANYL CITRATE (PF) 100 MCG/2ML IJ SOLN
25.0000 ug | INTRAMUSCULAR | Status: DC | PRN
  Administered 2015-01-12 (×2): 50 ug via INTRAVENOUS

## 2015-01-12 MED ORDER — FENTANYL CITRATE (PF) 250 MCG/5ML IJ SOLN
INTRAMUSCULAR | Status: AC
  Filled 2015-01-12: qty 5

## 2015-01-12 MED ORDER — LACTATED RINGERS IR SOLN
Status: DC | PRN
  Administered 2015-01-12: 3000 mL

## 2015-01-12 MED ORDER — KETOROLAC TROMETHAMINE 30 MG/ML IJ SOLN
30.0000 mg | Freq: Four times a day (QID) | INTRAMUSCULAR | Status: DC
  Administered 2015-01-12 – 2015-01-13 (×3): 30 mg via INTRAVENOUS
  Filled 2015-01-12 (×3): qty 1

## 2015-01-12 MED ORDER — DEXAMETHASONE SODIUM PHOSPHATE 10 MG/ML IJ SOLN
INTRAMUSCULAR | Status: AC
Start: 2015-01-12 — End: 2015-01-12
  Filled 2015-01-12: qty 1

## 2015-01-12 MED ORDER — NEOSTIGMINE METHYLSULFATE 10 MG/10ML IV SOLN
INTRAVENOUS | Status: DC | PRN
  Administered 2015-01-12: 4 mg via INTRAVENOUS

## 2015-01-12 MED ORDER — LACTATED RINGERS IV SOLN
INTRAVENOUS | Status: DC
  Administered 2015-01-12 (×2): via INTRAVENOUS

## 2015-01-12 MED ORDER — ERYTHROMYCIN 5 MG/GM OP OINT
TOPICAL_OINTMENT | Freq: Four times a day (QID) | OPHTHALMIC | Status: DC
  Administered 2015-01-12 – 2015-01-13 (×3): via OPHTHALMIC
  Filled 2015-01-12: qty 3.5

## 2015-01-12 MED ORDER — SODIUM CHLORIDE 0.9 % IV SOLN
INTRAVENOUS | Status: DC | PRN
  Administered 2015-01-12: 60 mL

## 2015-01-12 MED ORDER — DEXTROSE-NACL 5-0.45 % IV SOLN
INTRAVENOUS | Status: DC
  Administered 2015-01-12: 20:00:00 via INTRAVENOUS

## 2015-01-12 MED ORDER — ONDANSETRON HCL 4 MG/2ML IJ SOLN: 4.0000 mg | Freq: Once | INTRAMUSCULAR | Status: DC | PRN

## 2015-01-12 MED ORDER — PROPOFOL 10 MG/ML IV BOLUS
INTRAVENOUS | Status: AC
  Filled 2015-01-12: qty 20

## 2015-01-12 MED ORDER — DEXAMETHASONE SODIUM PHOSPHATE 10 MG/ML IJ SOLN: INTRAMUSCULAR | Status: DC | PRN

## 2015-01-12 MED ORDER — LIDOCAINE HCL (CARDIAC) 20 MG/ML IV SOLN
INTRAVENOUS | Status: DC | PRN
  Administered 2015-01-12 (×2): 50 mg via INTRAVENOUS

## 2015-01-12 MED ORDER — FENTANYL CITRATE (PF) 100 MCG/2ML IJ SOLN
INTRAMUSCULAR | Status: DC | PRN
  Administered 2015-01-12 (×6): 50 ug via INTRAVENOUS

## 2015-01-12 MED ORDER — KETOROLAC TROMETHAMINE 30 MG/ML IJ SOLN: 30.0000 mg | Freq: Four times a day (QID) | INTRAMUSCULAR | Status: DC

## 2015-01-12 MED ORDER — GLYCOPYRROLATE 0.2 MG/ML IJ SOLN
INTRAMUSCULAR | Status: AC
  Filled 2015-01-12: qty 4

## 2015-01-12 MED ORDER — FENTANYL CITRATE (PF) 100 MCG/2ML IJ SOLN
INTRAMUSCULAR | Status: AC
  Filled 2015-01-12: qty 2

## 2015-01-12 MED ORDER — ROCURONIUM BROMIDE 100 MG/10ML IV SOLN
INTRAVENOUS | Status: AC
  Filled 2015-01-12: qty 1

## 2015-01-12 MED ORDER — GLYCOPYRROLATE 0.2 MG/ML IJ SOLN
INTRAMUSCULAR | Status: DC | PRN
  Administered 2015-01-12: .7 mg via INTRAVENOUS

## 2015-01-12 MED ORDER — PROPOFOL 10 MG/ML IV BOLUS
INTRAVENOUS | Status: DC | PRN
  Administered 2015-01-12: 200 mg via INTRAVENOUS

## 2015-01-12 MED ORDER — ONDANSETRON HCL 4 MG/2ML IJ SOLN
INTRAMUSCULAR | Status: DC | PRN
  Administered 2015-01-12: 4 mg via INTRAVENOUS

## 2015-01-12 MED ORDER — NEOSTIGMINE METHYLSULFATE 10 MG/10ML IV SOLN
INTRAVENOUS | Status: AC
  Filled 2015-01-12: qty 1

## 2015-01-12 MED ORDER — SODIUM CHLORIDE 0.9 % IJ SOLN
INTRAMUSCULAR | Status: AC
  Filled 2015-01-12: qty 50

## 2015-01-12 MED ORDER — ONDANSETRON HCL 4 MG/2ML IJ SOLN
INTRAMUSCULAR | Status: AC
  Filled 2015-01-12: qty 2

## 2015-01-12 MED ORDER — MIDAZOLAM HCL 2 MG/2ML IJ SOLN
INTRAMUSCULAR | Status: DC | PRN
  Administered 2015-01-12: 2 mg via INTRAVENOUS

## 2015-01-12 MED ORDER — ROCURONIUM BROMIDE 100 MG/10ML IV SOLN
INTRAVENOUS | Status: DC | PRN
  Administered 2015-01-12: 20 mg via INTRAVENOUS
  Administered 2015-01-12 (×2): 10 mg via INTRAVENOUS
  Administered 2015-01-12: 35 mg via INTRAVENOUS
  Administered 2015-01-12: 10 mg via INTRAVENOUS

## 2015-01-12 MED ORDER — ACETAMINOPHEN 325 MG PO TABS: 650.0000 mg | ORAL_TABLET | ORAL | Status: DC | PRN

## 2015-01-12 MED ORDER — SCOPOLAMINE 1 MG/3DAYS TD PT72
MEDICATED_PATCH | TRANSDERMAL | Status: AC
  Administered 2015-01-12: 1.5 mg via TRANSDERMAL
  Filled 2015-01-12: qty 1

## 2015-01-12 MED ORDER — PANTOPRAZOLE SODIUM 40 MG IV SOLR
40.0000 mg | Freq: Every day | INTRAVENOUS | Status: DC
  Administered 2015-01-12: 40 mg via INTRAVENOUS
  Filled 2015-01-12: qty 40

## 2015-01-12 SURGICAL SUPPLY — 51 items
BARRIER ADHS 3X4 INTERCEED (GAUZE/BANDAGES/DRESSINGS) ×5 IMPLANT
BENZOIN TINCTURE PRP APPL 2/3 (GAUZE/BANDAGES/DRESSINGS) IMPLANT
CLOSURE WOUND 1/4X4 (GAUZE/BANDAGES/DRESSINGS) ×1
CLOTH BEACON ORANGE TIMEOUT ST (SAFETY) ×5 IMPLANT
CONT PATH 16OZ SNAP LID 3702 (MISCELLANEOUS) ×5 IMPLANT
COVER BACK TABLE 60X90IN (DRAPES) ×10 IMPLANT
COVER TIP SHEARS 8 DVNC (MISCELLANEOUS) ×3 IMPLANT
COVER TIP SHEARS 8MM DA VINCI (MISCELLANEOUS) ×2
DECANTER SPIKE VIAL GLASS SM (MISCELLANEOUS) ×5 IMPLANT
DRAPE WARM FLUID 44X44 (DRAPE) ×5 IMPLANT
DURAPREP 26ML APPLICATOR (WOUND CARE) ×5 IMPLANT
ELECT REM PT RETURN 9FT ADLT (ELECTROSURGICAL) ×5
ELECTRODE REM PT RTRN 9FT ADLT (ELECTROSURGICAL) ×3 IMPLANT
GAUZE VASELINE 3X9 (GAUZE/BANDAGES/DRESSINGS) IMPLANT
GLOVE BIOGEL PI IND STRL 7.0 (GLOVE) ×12 IMPLANT
GLOVE BIOGEL PI INDICATOR 7.0 (GLOVE) ×8
GLOVE ECLIPSE 6.5 STRL STRAW (GLOVE) ×20 IMPLANT
KIT ACCESSORY DA VINCI DISP (KITS) ×2
KIT ACCESSORY DVNC DISP (KITS) ×3 IMPLANT
LEGGING LITHOTOMY PAIR STRL (DRAPES) ×5 IMPLANT
LIQUID BAND (GAUZE/BANDAGES/DRESSINGS) ×5 IMPLANT
NEEDLE INSUFFLATION 120MM (ENDOMECHANICALS) ×5 IMPLANT
OCCLUDER COLPOPNEUMO (BALLOONS) ×5 IMPLANT
PACK ROBOT WH (CUSTOM PROCEDURE TRAY) ×5 IMPLANT
PACK ROBOTIC GOWN (GOWN DISPOSABLE) ×5 IMPLANT
PAD POSITIONER PINK NONSTERILE (MISCELLANEOUS) ×5 IMPLANT
PAD PREP 24X48 CUFFED NSTRL (MISCELLANEOUS) ×10 IMPLANT
SET BI-LUMEN FLTR TB AIRSEAL (TUBING) ×5 IMPLANT
SET CYSTO W/LG BORE CLAMP LF (SET/KITS/TRAYS/PACK) ×5 IMPLANT
SET IRRIG TUBING LAPAROSCOPIC (IRRIGATION / IRRIGATOR) ×5 IMPLANT
STRIP CLOSURE SKIN 1/4X4 (GAUZE/BANDAGES/DRESSINGS) ×4 IMPLANT
SUT VIC AB 0 CT1 27 (SUTURE) ×10
SUT VIC AB 0 CT1 27XBRD ANBCTR (SUTURE) ×15 IMPLANT
SUT VICRYL 0 UR6 27IN ABS (SUTURE) ×5 IMPLANT
SUT VICRYL RAPIDE 4/0 PS 2 (SUTURE) ×10 IMPLANT
SUT VLOC 180 0 9IN  GS21 (SUTURE) ×2
SUT VLOC 180 0 9IN GS21 (SUTURE) ×3 IMPLANT
SYR 50ML LL SCALE MARK (SYRINGE) ×5 IMPLANT
TIP RUMI ORANGE 6.7MMX12CM (TIP) IMPLANT
TIP UTERINE 5.1X6CM LAV DISP (MISCELLANEOUS) ×5 IMPLANT
TIP UTERINE 6.7X10CM GRN DISP (MISCELLANEOUS) IMPLANT
TIP UTERINE 6.7X6CM WHT DISP (MISCELLANEOUS) ×5 IMPLANT
TIP UTERINE 6.7X8CM BLUE DISP (MISCELLANEOUS) ×5 IMPLANT
TOWEL OR 17X24 6PK STRL BLUE (TOWEL DISPOSABLE) ×10 IMPLANT
TRAY FOLEY BAG SILVER LF 16FR (SET/KITS/TRAYS/PACK) ×5 IMPLANT
TROCAR DILATING TIP 12MM 150MM (ENDOMECHANICALS) ×5 IMPLANT
TROCAR DISP BLADELESS 8 DVNC (TROCAR) ×3 IMPLANT
TROCAR DISP BLADELESS 8MM (TROCAR) ×2
TROCAR XCEL NON-BLD 5MMX100MML (ENDOMECHANICALS) ×5 IMPLANT
WARMER LAPAROSCOPE (MISCELLANEOUS) ×5 IMPLANT
WATER STERILE IRR 1000ML POUR (IV SOLUTION) ×15 IMPLANT

## 2015-01-12 NOTE — Addendum Note (Signed)
Addendum  created 01/12/15 1927 by Josephine Igo, MD   Modules edited: Notes Section   Notes Section:  File: [id number]

## 2015-01-12 NOTE — H&P (Signed)
Diane Scott is an 39 y.o. female G2P2 MWF here for robotic hysterectomy/bilateral salpingectomy/possible BSO/cystoscopy due to DUB and failed endometrial ablation.  Pt also has post-endometrial ablation type pain that continues to worsen.  She has decided to proceed with definitive management.  Alternative treatments have been discussed.  Risks and benefits have been discussed.  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  Pre op ultrasound showed 8 x 6.3 x 4.5cm uterus.  No fibroids noted.  Risks and benefits have been discussed.  Pt is here with spouse and ready to proceed.  Pertinent Gynecological History: Menses: flow is moderate Bleeding: dysfunctional uterine bleeding Contraception: tubal ligation DES exposure: denies Blood transfusions: none Sexually transmitted diseases: no past history Previous GYN Procedures: none  Last mammogram: never Date: n/a Last pap: normal Date: 10/23/14 OB History: G2, P2   Menstrual History: Patient's last menstrual period was 12/24/2014.    Past Medical History  Diagnosis Date  . Dysplasia of skin   . Depression   . Frequent headaches   . Migraines     numbness on side of face with them  . Cystocele   . Arthritis   . Migraine headache with aura 2012  . GERD (gastroesophageal reflux disease)     Past Surgical History  Procedure Laterality Date  . Cholecystectomy  2008  . Tubal ligation    . Uterine ablation  2/13  . Orif finger fracture    . Endometrial biopsy      neg per patient  . Colonoscopy      Family History  Problem Relation Age of Onset  . Arthritis Mother   . Hypertension Mother   . Hyperlipidemia Mother   . Stroke Mother   . Arthritis Father   . Hyperlipidemia Father   . Hypertension Father   . Diabetes Father   . Cancer Maternal Aunt     melanoma  . Cancer Paternal Uncle     melanoma  . Arthritis Maternal Grandmother   . Alcohol abuse Maternal  Grandfather   . Squamous cell carcinoma Maternal Grandfather   . Arthritis Paternal Grandmother   . Cancer Paternal Grandmother     melanoma  . Alcohol abuse Paternal Grandfather   . Squamous cell carcinoma Maternal Aunt   . Colon cancer Paternal Uncle   . Liver cancer Paternal Uncle     Social History:  reports that she quit smoking about 14 years ago. She has never used smokeless tobacco. She reports that she does not drink alcohol or use illicit drugs.  Allergies: No Known Allergies  Prescriptions prior to admission  Medication Sig Dispense Refill Last Dose  . Famotidine (PEPCID PO) Take by mouth as needed.   Past Week at Unknown time  . Multiple Vitamins-Minerals (MULTIVITAMIN PO) Take 1 tablet by mouth daily.   Taking  . ibuprofen (ADVIL,MOTRIN) 800 MG tablet Take 1 tablet (800 mg total) by mouth every 8 (eight) hours as needed. 30 tablet 0   . oxyCODONE-acetaminophen (PERCOCET) 5-325 MG per tablet Take 2 tablets by mouth every 4 (four) hours as needed. use only as much as needed to relieve pain 30 tablet 0     Review of Systems  All other systems reviewed and are negative.   Blood pressure 114/85, pulse 88, temperature 98.4 F (36.9 C), temperature source Oral, resp. rate 16, last menstrual period 12/24/2014, SpO2 98 %. Physical Exam  Constitutional: She is oriented to person,  place, and time. She appears well-developed and well-nourished.  Cardiovascular: Normal rate and regular rhythm.   Respiratory: Effort normal and breath sounds normal.  Neurological: She is alert and oriented to person, place, and time.  Skin: Skin is warm and dry.  Psychiatric: She has a normal mood and affect.    Results for orders placed or performed during the hospital encounter of 01/12/15 (from the past 24 hour(s))  Pregnancy, urine     Status: None   Collection Time: 01/12/15  8:50 AM  Result Value Ref Range   Preg Test, Ur NEGATIVE NEGATIVE    No results found.  Assessment/Plan: 15  G2P2 MWF with DUB, pelvic pain, failed endometrial ablation here for definitive treatment.  Robotic TLH/bilateral salpingectomy/possible BSO/cystoscopy planned.  All questions answered.  She is here and ready to proceed.  [name] 01/12/2015, 9:59 AM

## 2015-01-12 NOTE — Anesthesia Postprocedure Evaluation (Signed)
  Anesthesia Post-op Note  Patient: Diane Scott  Procedure(s) Performed: Procedure(s): ROBOTIC ASSISTED TOTAL HYSTERECTOMY (N/A) BILATERAL SALPINGECTOMY (Bilateral) CYSTOSCOPY (N/A)  Patient Location: PACU and Women's Unit  Anesthesia Type:General  Level of Consciousness: awake, alert  and oriented  Airway and Oxygen Therapy: Patient Spontanous Breathing  Post-op Pain: none  Post-op Assessment: Post-op Vital signs reviewed, Patient's Cardiovascular Status Stable, Respiratory Function Stable, Patent Airway, No signs of Nausea or vomiting and Pain level controlled  Post-op Vital Signs: Reviewed and stable  Last Vitals:  Filed Vitals:   01/12/15 1500  BP: 121/48  Pulse: 76  Temp: 36.9 C  Resp: 18    Complications: possible eye injury. Patient complaining of foreign body sensation in OS, suspect corneal abrasion. Unable to visualize any abrasion. Dr. Sabra Heck has already prescribed corneal abrasion meds. Will treat as corneal abrasion.

## 2015-01-12 NOTE — Op Note (Signed)
01/12/2015  1:21 PM  PATIENT:  Diane Scott  39 y.o. female  PRE-OPERATIVE DIAGNOSIS:  DUB, menorrhagia, Failed HTA.  POST-OPERATIVE DIAGNOSIS:  DUB, menorrhagia, failed HTA  PROCEDURE:  Procedure(s): ROBOTIC ASSISTED TOTAL HYSTERECTOMY BILATERAL SALPINGECTOMY CYSTOSCOPY  SURGEON:  Priyah Schmuck [name]  ASSISTANTS: Josefa Half, MD   ANESTHESIA:   general  ESTIMATED BLOOD LOSS: 50cc  BLOOD ADMINISTERED:none   FLUIDS: 1000cc LR  UOP: 250cc clear  SPECIMEN:  Uterus, cervix, bilateral fallopian tubes  DISPOSITION OF SPECIMEN:  PATHOLOGY  FINDINGS: normal pelvis, boggy uterus, normal ovaries, s/p BTL  DESCRIPTION OF OPERATION: DESCRIPTION OF OPERATION: Patient is taken to the operating room. She is placed in the supine position. She is a running IV in place. Informed consent was present on the chart. SCDs on her lower extremities and functioning properly. General endotracheal anesthesia was administered by the anesthesia staff without difficulty.  Once adequate anesthesia was confirmed the legs are placed in the low lithotomy position in [name].  Her arms were tucked by the side.   Chlor prep was then used to prep the abdomen and Betadine was used to prep the inner thighs, perineum and vagina. Once 3 minutes had past the patient was draped in a normal standard fashion. The legs were lifted to the high lithotomy position. The cervix was visualized by placing a heavy weighted speculum in the posterior aspect of the vagina and using a curved Deaver retractor to the retract anteriorly. The anterior lip of the cervix was grasped with single-tooth tenaculum. The cervix sounded to 8cm. Pratt dilators were used to dilate the cervix up to a #21. A RUMI uterine manipulator was obtained. A #8 disposable tip was placed on the RUMI manipulator as well as a medium KOH ring. This was passed through the cervix and the bulb of the disposable tip was inflated with 10 cc of normal saline. There  was a good fit of the KOH ring around the cervix. The tenaculum was removed. There is also good manipulation of the uterus. The speculum and retractor were removed as well. A Foley catheter was placed to straight drain. The concentrated urine was noted. Legs were lowered to the low lithotomy position and attention was turned the abdomen.  Ropivacaine mixture (0.5% mixed one-to-one with normal saline) was used anesthetize the skin beneath the umbilicus.  37mm skin incision was made.  A Veress needle was obtained.   The abdomen was elevated and the needle was passed directly into the abdomen. The peritoneum was felt as a pop as it was passed with the needle. A syringe of normal saline was attached the needle and aspiration was performed. No blood or fluid was noted. Fluid was injected without difficulty and a second aspiration was performed. No fluid or blood or saline was noted. Fluid dripped easily into the needle. Low flow CO2 gas was attached the needle and the pneumoperitoneum was achieved without difficulty. Once 3 liters of gas was in the abdomen the Veress needle was removed and a 12 millimeter port bladed trocar were passed directly to the abdomen. The laparoscope was then used to confirm intraperitoneal placement.  The upper abdomen could be surveyed without difficulty. Locations for the 1 and #2 arm ports could also be visualized. The skin was transilluminated and the skin was anesthetized with ropivacaine mixture. 25mm skin incisions were made about 10 cm lateral to the umbilicus on each side. Then 76mm nondisposable trocar ports were passed directly into the abdomen. Also on the right  lower quadrant a 5 mm skin incision was made after anesthetize the skin with the ropivacaine mixture. A 5 mm non-bladed trocar port were also passed directly into the abdomen. All trochars were removed.  The table was placed on the floor and the patient was placed in steep Trendelenburg.  The robot was docked in a normal  standard fashion to the left of the table. In the #1 arm was placed endoscopic scissors with monopolar cautery attached and then the #2 arm was placed PK [place] with bipolar cautery attached. The systems are right side of the table for the right lower quadrant incision was located.  Ureters were seen with normal peristalsis bilaterally.  Right was more difficulty to initially see than the left.  Attention was turned to the right side. With uterus on stretch the right tube was excised off the ovary and mesosalpinx was dissected to free the tube. Then the right utero-ovarian pedicle was serially clamped cauterized and incised. Right round ligament was serially clamped cauterized and incised. The anterior and posterior peritoneum of the inferior leaf of the broad ligament were opened. The scar from prior cesarean section was dissected sharply until the bladder flap could be created. The bladder was taken down below the level of the KOH ring. The right uterine artery skeletonized and then just superior to the KOH ring this vessel was serially clamped, cauterized, and incised.  Attention was turned the left side.  The tube was excised off the ovary using sharp dissection a bipolar cautery.  The mesosalpinx was incised freeing the tube. Then the left uterine ovarian pedicle was serially clamped cauterized and incised. Next the left round ligament was serially clamped cauterized and incised. The anterior posterior peritoneum of the inferiorly for the broad ligament were opened. The anterior peritoneum was carried across to the dissection on the right side. The remainder of the bladder flap was created using sharp dissection. The bladder was well below the level of the KOH ring. The left uterine artery skeletonized. Then the left uterine artery, above the level of the KOH ring, was serially clamped cauterized and incised. The uterus was devascularized at this point.  The colpotomy was performed a starting in the  midline and using monopolar cautery with an open edge of the scissors. This was carried around in a circumferential fashion until the vaginal mucosa was completely incised in the specimen was freed.  The specimen was then delivered to the vagina.  A vaginal occlusive device was used to maintain the pneumoperitoneum  Instruments were changed with a needle cut suture driver placed in arm 1 and a Cobra grasper placed #2. A V. lock suture was passed through the middle port. Starting in the right angle, the cuff was closed incorporating the anterior and posterior vaginal mucosa in each stitch. This was carried across all the way to the left corner and a running fashion. Two stitches were brought back towards the midline and the suture was cut flush with the vagina. The needle was brought out the pelvis. The pelvis was irrigated. All pedicles were inspected. No bleeding was noted. In Interceed was placed across vaginal cuff.  At this point the procedure was completed. The instruments were removed. The robot was undocked. The patient was taken out of Trendelenburg positioning. The ports were removed under direct vision shove laparoscope and the pneumoperitoneum was relieved. Several deep breaths were given to the patient's trying to remove any gas the abdomen and finally the midline port was removed.  The  midline port was closed at the fascial level with figure-of-eight suture of #0 Vicryl. The skin was then closed with subcuticular stitches of 3-0 Vicryl. The skin was cleansed Dermabond was applied. Attention was then turned the vagina and the cuff was inspected. No bleeding was noted. The anterior posterior vaginal mucosa was incorporated in each stitch. The Foley catheter was removed and cystoscopy was performed.  Bladder was without any evidence of sutures or injury.  Normal urine jets were seen from each ureteral orifice.  Fluid was drained and cystoscopy was ended.  Foley catheter was replaced.  Sponge, lap,  needle, instrument counts were correct x2. Patient tolerated the procedure very well. She was awakened from anesthesia, extubated and taken to recovery in stable condition.   COUNTS:  YES  PLAN OF CARE: Transfer to PACU

## 2015-01-12 NOTE — Anesthesia Postprocedure Evaluation (Signed)
  Anesthesia Post-op Note  Patient: Diane Scott  Procedure(s) Performed: Procedure(s): ROBOTIC ASSISTED TOTAL HYSTERECTOMY (N/A) BILATERAL SALPINGECTOMY (Bilateral) CYSTOSCOPY (N/A)  Patient Location: PACU  Anesthesia Type:General  Level of Consciousness: awake, alert  and oriented  Airway and Oxygen Therapy: Patient Spontanous Breathing  Post-op Pain: mild  Post-op Assessment: Post-op Vital signs reviewed, Patient's Cardiovascular Status Stable, Respiratory Function Stable, Patent Airway, No signs of Nausea or vomiting and Pain level controlled  Post-op Vital Signs: Reviewed and stable  Last Vitals:  Filed Vitals:   01/12/15 1345  BP: 120/61  Pulse: 78  Temp:   Resp: 21    Complications: No apparent anesthesia complications

## 2015-01-12 NOTE — Progress Notes (Signed)
Day of Surgery Procedure(s) (LRB): ROBOTIC ASSISTED TOTAL HYSTERECTOMY (N/A) BILATERAL SALPINGECTOMY (Bilateral) CYSTOSCOPY (N/A)  Subjective: Patient reports no complaints except left eye irritation and pain.  Has been present since recovery room.  RN on floor has given saline drops which haven't really helped.  Pt initially felt like she had an eyelash in her eye.    Objective: I have reviewed patient's vital signs, intake and output, medications and labs.  General: alert and cooperative Resp: clear to auscultation bilaterally Cardio: regular rate and rhythm, S1, S2 normal, no murmur, click, rub or gallop GI: soft, non-tender; bowel sounds normal; no masses,  no organomegaly and incision: clean, dry and intact Extremities: extremities normal, atraumatic, no cyanosis or edema and and SCDs on and functioning Vaginal Bleeding: none  Assessment: s/p Procedure(s): ROBOTIC ASSISTED TOTAL HYSTERECTOMY (N/A) BILATERAL SALPINGECTOMY (Bilateral) CYSTOSCOPY (N/A): stable and progressing well  Possible corneal abrasion  Plan: Advance diet Encourage ambulation consider D/C foley later this evening after pt ambulates  Start erythromycin ointment and ketorolac opthalmic solution for pain control     Diane Scott Diane Scott 01/12/2015, 6:11 PM

## 2015-01-12 NOTE — Transfer of Care (Signed)
Immediate Anesthesia Transfer of Care Note  Patient: Diane Scott  Procedure(s) Performed: Procedure(s): ROBOTIC ASSISTED TOTAL HYSTERECTOMY (N/A) BILATERAL SALPINGECTOMY (Bilateral) CYSTOSCOPY (N/A)  Patient Location: PACU  Anesthesia Type:General  Level of Consciousness: awake and oriented  Airway & Oxygen Therapy: Patient Spontanous Breathing and Patient connected to nasal cannula oxygen  Post-op Assessment: Report given to RN and Post -op Vital signs reviewed and stable  Post vital signs: Reviewed and stable  Last Vitals:  Filed Vitals:   01/12/15 0848  BP: 114/85  Pulse: 88  Temp: 36.9 C  Resp: 16    Complications: No apparent anesthesia complications

## 2015-01-13 ENCOUNTER — Encounter (HOSPITAL_COMMUNITY): Payer: Self-pay | Admitting: Obstetrics & Gynecology

## 2015-01-13 DIAGNOSIS — N800 Endometriosis of uterus: Secondary | ICD-10-CM | POA: Diagnosis not present

## 2015-01-13 LAB — BASIC METABOLIC PANEL
ANION GAP: 4 — AB (ref 5–15)
BUN: 9 mg/dL (ref 6–20)
CO2: 28 mmol/L (ref 22–32)
Calcium: 8.4 mg/dL — ABNORMAL LOW (ref 8.9–10.3)
Chloride: 107 mmol/L (ref 101–111)
Creatinine, Ser: 0.69 mg/dL (ref 0.44–1.00)
GFR calc non Af Amer: >60 mL/min (ref >60)
Glucose, Bld: 149 mg/dL — ABNORMAL HIGH (ref 65–99)
Potassium: 4.2 mmol/L (ref 3.5–5.1)
Sodium: 139 mmol/L (ref 135–145)

## 2015-01-13 LAB — CBC
HCT: 35.4 % — ABNORMAL LOW (ref 36.0–46.0)
Hemoglobin: 11.8 g/dL — ABNORMAL LOW (ref 12.0–15.0)
MCH: 29.2 pg (ref 26.0–34.0)
MCHC: 33.3 g/dL (ref 30.0–36.0)
MCV: 87.6 fL (ref 78.0–100.0)
Platelets: 242 10*3/uL (ref 150–400)
RBC: 4.04 MIL/uL (ref 3.87–5.11)
RDW: 13.2 % (ref 11.5–15.5)
WBC: 19 10*3/uL — ABNORMAL HIGH (ref 4.0–10.5)

## 2015-01-13 MED ORDER — PANTOPRAZOLE SODIUM 40 MG PO TBEC: 40.0000 mg | DELAYED_RELEASE_TABLET | Freq: Every day | ORAL | Status: DC

## 2015-01-13 MED ORDER — ERYTHROMYCIN 5 MG/GM OP OINT: TOPICAL_OINTMENT | Freq: Four times a day (QID) | OPHTHALMIC | Status: DC

## 2015-01-13 MED ORDER — KETOROLAC TROMETHAMINE 0.5 % OP SOLN: 1.0000 [drp] | Freq: Four times a day (QID) | OPHTHALMIC | Status: DC

## 2015-01-13 NOTE — Discharge Instructions (Signed)
Post Op Hysterectomy Instructions Please read the instructions below. Refer to these instructions for the next few weeks. These instructions provide you with general information on caring for yourself after surgery. Your caregiver may also give you specific instructions. While your treatment has been planned according to the most current medical practices available, unavoidable problems sometimes happen. If you have any problems or questions after you leave, please call your caregiver.  HOME CARE INSTRUCTIONS Healing will take time. You will have discomfort, tenderness, swelling and bruising at the operative site for a couple of weeks. This is normal and will get better as time goes on.  Only take over-the-counter or prescription medicines for pain, discomfort or fever as directed by your caregiver.  Do not take aspirin. It can cause bleeding.  Do not drive when taking pain medication.  Follow your caregiver's advice regarding diet, exercise, lifting, driving and general activities.  Resume your usual diet as directed and allowed.  Get plenty of rest and sleep.  Do not douche, use tampons, or have sexual intercourse until your caregiver gives you permission. .  Take your temperature if you feel hot or flushed.  You may shower today when you get home.  No tub bath for one week.   Do not drink alcohol until you are not taking any narcotic pain medications.  Try to have someone home with you for a week or two to help with the household activities.   Be careful over the next two to three weeks with any activities at home that involve lifting, pushing, or pulling.  Listen to your body--if something feels uncomfortable to do, then don't do it. Make sure you and your family understands everything about your operation and recovery.  Walking up stairs is fine. Do not sign any legal documents until you feel normal again.  Keep all your follow-up appointments as recommended by your caregiver.   PLEASE CALL  THE OFFICE IF: There is swelling, redness or increasing pain in the wound area.  Pus is coming from the wound.  You notice a bad smell from the wound or surgical dressing.  You have pain, redness and swelling from the intravenous site.  The wound is breaking open (the edges are not staying together).   You develop pain or bleeding when you urinate.  You develop abnormal vaginal discharge.  You have any type of abnormal reaction or develop an allergy to your medication.  You need stronger pain medication for your pain   SEEK IMMEDIATE MEDICAL CARE: You develop a temperature of 100.5 or higher.  You develop abdominal pain.  You develop chest pain.  You develop shortness of breath.  You pass out.  You develop pain, swelling or redness of your leg.  You develop heavy vaginal bleeding with or without blood clots.   MEDICATIONS: Restart your regular medications BUT wait one week before restarting all vitamins and mineral supplements Use Motrin 800mg every 8 hours for the next several days.  This will help you use less Percocet.  Use the Percocet 5/325 1-2 tabs every 4-6 hours as needed for pain. You may use an over the counter stool softener like Colace or Dulcolax to help with starting a bowel movement.  Start the day after you go home.  Warm liquids, fluids, and ambulation help too.  If you have not had a bowel movement in four days, you need to call the office.    too.  If you have not had a bowel movement in four days, you need to call the office.  Remove the scopolamine patch and dressing at umbilicus on Thursday.  Use the erythromycin and Ketoralac eye drops for five days.

## 2015-01-13 NOTE — Progress Notes (Signed)

## 2015-01-13 NOTE — Progress Notes (Signed)
1 Day Post-Op Procedure(s) (LRB): ROBOTIC ASSISTED TOTAL HYSTERECTOMY (N/A) BILATERAL SALPINGECTOMY (Bilateral) CYSTOSCOPY (N/A)  Subjective: Patient reports good pain control.  No nausea.  Ordered regular breakfast.  Foley catheter is out.  Walking without assistance.  Reports she feels really good.  Eye is much better since starting the ointment and drops last night.  Objective: I have reviewed patient's vital signs, intake and output and medications. Tmax:  99.3 yesterday, Tc: 98.2 P:  58-90 BP: 93-120/48-85 UOP: 3025cc BMP with glucose 149 Post op hb: 11.9  General: alert and cooperative Resp: clear to auscultation bilaterally Cardio: regular rate and rhythm, S1, S2 normal, no murmur, click, rub or gallop GI: soft, non-tender; bowel sounds normal; no masses,  no organomegaly and incision: clean, dry and intact Extremities: extremities normal, atraumatic, no cyanosis or edema Vaginal Bleeding: minimal  Assessment: s/p Procedure(s): ROBOTIC ASSISTED TOTAL HYSTERECTOMY (N/A) BILATERAL SALPINGECTOMY (Bilateral) CYSTOSCOPY (N/A): progressing well  Plan: Advance diet Encourage ambulation Advance to PO medication Discontinue IV fluids Voiding trials this am.  Hopeful discharge later.     Diane Scott 01/13/2015, 7:30 AM

## 2015-01-13 NOTE — Addendum Note (Signed)
Addendum  created 01/13/15 6226 by Ignacia Bayley, CRNA   Modules edited: Notes Section   Notes Section:  File: [id number]

## 2015-01-13 NOTE — Progress Notes (Signed)
Pt. Is  Discharged in the care of husband,with [employer]. States pain is better now. Discharge instructions with Rx were given to pt with good understanding. Questions were asked and answered. Abdominal  lapsites are clean and dry. Denies any excessive vaginal bleeding. No equipment needed for home use. Stable.

## 2015-01-13 NOTE — Anesthesia Postprocedure Evaluation (Signed)
  Anesthesia Post-op Note  Patient: Diane Scott  Procedure(s) Performed: Procedure(s): ROBOTIC ASSISTED TOTAL HYSTERECTOMY (N/A) BILATERAL SALPINGECTOMY (Bilateral) CYSTOSCOPY (N/A)  Patient Location: 309  Anesthesia Type:General  Level of Consciousness: awake  Airway and Oxygen Therapy: Patient Spontanous Breathing  Post-op Pain: mild  Post-op Assessment: Patient's Cardiovascular Status Stable and Respiratory Function Stable  Post-op Vital Signs: stable  Last Vitals:  Filed Vitals:   01/13/15 0535  BP: 93/53  Pulse: 58  Temp: 36.8 C  Resp: 18    Complications: possible eye injury. Patient reports that eye irritation very minimal and much improved.

## 2015-01-13 NOTE — Discharge Summary (Signed)
Physician Discharge Summary  Patient ID: Diane Scott MRN: [id number] DOB/AGE: 1976/08/06 39 y.o.  Admit date: 01/12/2015 Discharge date: 01/13/2015  Admission Diagnoses: DUB, dysmenorrhea, failed endometrial ablation, cystocele, SUI  Discharge Diagnoses:  Active Problems:   * No active hospital problems. *  Discharged Condition: good  Hospital Course: Patient admitted through same day surgery.  She was taken to OR where Robotic TLH/bilateral salpingectomy/cystoscopy were performed.  Surgical findings included enlarged uterus without any fibroids.  No adhesions were noted.  Surgery was uneventful.  EBL 50cc.  Foley catheter was left in place.  Patient transferred to PACU where she was stable and then to 3rd floor for the remainder of her hospitalization.  During her post-op recovery, her vitals and stable and she was AF.  In evening of POD#0, she complained of eye irritation and treatment for a corneal abrasion was initiated.  This helped significantly.  She was seen by anesthesia and no clear abrasion was noted but pt was continued on prescribed eye drops and ointment.  In evening of POD#1, she was transitioned to oral pain medications and regular diet.  She was able to ambulate and she had good pain control.  Patient seen both in the evening of POD#0 and AM of POD#1.  In the AM of POD#1, she was without complaint and her eye was much better.  Post op hb was 11.8, decreased from 13.0, pre-operatively.  At this point, patient was voiding, walking, having excellent pain control, had no nausea, and minimal vaginal bleeding.  She was ready for D/C.  Consults: None  Significant Diagnostic Studies: labs: post op hb 11.8  Treatments: surgery: Robotic TLH/bilateral salpingectomy/cystoscopy  Discharge Exam: Blood pressure 101/48, pulse 62, temperature 98.6 F (37 C), temperature source Oral, resp. rate 18, height 5\' 6"  (1.676 m), weight 250 lb (113.399 kg), last menstrual period 12/24/2014, SpO2 97  %. General appearance: alert and cooperative Resp: clear to auscultation bilaterally Cardio: regular rate and rhythm, S1, S2 normal, no murmur, click, rub or gallop GI: soft, non-tender; bowel sounds normal; no masses,  no organomegaly Incision/Wound: C/D/I  Disposition: Final discharge disposition not confirmed     Medication List    TAKE these medications        erythromycin ophthalmic ointment  Place into the left eye every 6 (six) hours. Stop after 5 days     ibuprofen 800 MG tablet  Commonly known as:  ADVIL,MOTRIN  Take 1 tablet (800 mg total) by mouth every 8 (eight) hours as needed.     ketorolac 0.5 % ophthalmic solution  Commonly known as:  ACULAR  Place 1 drop into the left eye every 6 (six) hours.     MULTIVITAMIN PO  Take 1 tablet by mouth daily.     oxyCODONE-acetaminophen 5-325 MG per tablet  Commonly known as:  PERCOCET  Take 2 tablets by mouth every 4 (four) hours as needed. use only as much as needed to relieve pain     PEPCID PO  Take by mouth as needed.           Follow-up Information    Follow up with Lyman Speller, MD On 01/20/2015.   Specialty:  Gynecology   Why:  aptp time 2:30pm   Contact information:   [address] [place] [id number] [phone]       Signed: Lyman Speller 01/13/2015, 11:00 AM

## 2015-01-14 ENCOUNTER — Observation Stay (HOSPITAL_COMMUNITY)
Admission: AD | Admit: 2015-01-14 | Discharge: 2015-01-16 | Disposition: A | Discharge status: Home / Self Care | Payer: PRIVATE HEALTH INSURANCE | Source: Ambulatory Visit | Attending: Obstetrics & Gynecology | Admitting: Obstetrics & Gynecology

## 2015-01-14 ENCOUNTER — Encounter (HOSPITAL_COMMUNITY): Payer: Self-pay | Admitting: *Deleted

## 2015-01-14 ENCOUNTER — Telehealth: Payer: Self-pay | Admitting: Obstetrics & Gynecology

## 2015-01-14 ENCOUNTER — Inpatient Hospital Stay (HOSPITAL_COMMUNITY): Payer: PRIVATE HEALTH INSURANCE

## 2015-01-14 DIAGNOSIS — Z90710 Acquired absence of both cervix and uterus: Secondary | ICD-10-CM | POA: Insufficient documentation

## 2015-01-14 DIAGNOSIS — G43909 Migraine, unspecified, not intractable, without status migrainosus: Secondary | ICD-10-CM | POA: Insufficient documentation

## 2015-01-14 DIAGNOSIS — M1990 Unspecified osteoarthritis, unspecified site: Secondary | ICD-10-CM | POA: Insufficient documentation

## 2015-01-14 DIAGNOSIS — Z872 Personal history of diseases of the skin and subcutaneous tissue: Secondary | ICD-10-CM | POA: Insufficient documentation

## 2015-01-14 DIAGNOSIS — Z79899 Other long term (current) drug therapy: Secondary | ICD-10-CM | POA: Diagnosis not present

## 2015-01-14 DIAGNOSIS — K219 Gastro-esophageal reflux disease without esophagitis: Secondary | ICD-10-CM | POA: Diagnosis not present

## 2015-01-14 DIAGNOSIS — R5082 Postprocedural fever: Principal | ICD-10-CM | POA: Insufficient documentation

## 2015-01-14 DIAGNOSIS — F329 Major depressive disorder, single episode, unspecified: Secondary | ICD-10-CM | POA: Diagnosis not present

## 2015-01-14 DIAGNOSIS — Z9851 Tubal ligation status: Secondary | ICD-10-CM | POA: Diagnosis not present

## 2015-01-14 LAB — CBC WITH DIFFERENTIAL/PLATELET
BASOS ABS: 0 10*3/uL (ref 0.0–0.1)
Basophils Relative: 0 % (ref 0–1)
EOS PCT: 2 % (ref 0–5)
Eosinophils Absolute: 0.2 10*3/uL (ref 0.0–0.7)
HCT: 34.4 % — ABNORMAL LOW (ref 36.0–46.0)
HEMOGLOBIN: 11.5 g/dL — AB (ref 12.0–15.0)
LYMPHS PCT: 15 % (ref 12–46)
Lymphs Abs: 2 10*3/uL (ref 0.7–4.0)
MCH: 29.2 pg (ref 26.0–34.0)
MCHC: 33.4 g/dL (ref 30.0–36.0)
MCV: 87.3 fL (ref 78.0–100.0)
Monocytes Absolute: 0.6 10*3/uL (ref 0.1–1.0)
Monocytes Relative: 4 % (ref 3–12)
NEUTROS ABS: 10.7 10*3/uL — AB (ref 1.7–7.7)
NEUTROS PCT: 79 % — AB (ref 43–77)
Platelets: 179 10*3/uL (ref 150–400)
RBC: 3.94 MIL/uL (ref 3.87–5.11)
RDW: 13.6 % (ref 11.5–15.5)
WBC: 13.5 10*3/uL — ABNORMAL HIGH (ref 4.0–10.5)

## 2015-01-14 LAB — URINALYSIS, ROUTINE W REFLEX MICROSCOPIC
BILIRUBIN URINE: NEGATIVE
Glucose, UA: NEGATIVE mg/dL
KETONES UR: NEGATIVE mg/dL
Nitrite: NEGATIVE
PH: 5.5 (ref 5.0–8.0)
Protein, ur: NEGATIVE mg/dL
Specific Gravity, Urine: 1.025 (ref 1.005–1.030)
UROBILINOGEN UA: 0.2 mg/dL (ref 0.0–1.0)

## 2015-01-14 LAB — URINE MICROSCOPIC-ADD ON

## 2015-01-14 NOTE — Telephone Encounter (Signed)
Pt called stating she just took her temp and it was 102.0.  She was feeling chills a little earlier and then felt much hotter and so took her temp.  Underwent robotic TLH a little over 48 hours ago.  Incisions look good to her.  Denies VB or vaginal discharge.  Pain appropriate.  I spoke to her earlier today regarding her pathology report and she told me then she was trying not to take any narcotics so she was having some pain earlier.  She did take a Percocet during the day and this helped.  She is emptying her bladder without pain and passing gas.  She report no bowel movement yet but does not feel distended.  Pt is a NP and very knowledgeable.  Due to fever so close to surgery, feel should evaluate in MAU.  Pt voices understanding.

## 2015-01-14 NOTE — MAU Note (Signed)
Pt reports s/p hysterectomy 2 days ago, fever 102.7 tonight and lower abd pain.

## 2015-01-14 NOTE — MAU Note (Signed)
Blood drawn by nurse.

## 2015-01-14 NOTE — MAU Provider Note (Signed)
History     CSN: [id number]  Arrival date and time: 01/14/15 2215   First Provider Initiated Contact with Patient 01/14/15 2320      No chief complaint on file.  HPI Comments: Diane Scott is a 39 y.o. [id number] who is S/P Robotic hysterectomy on 01/12/15. She presents today with a fever. She felt hot, and took her temp around 1700. She states that it was 102 at that time. She denies any vaginal bleeding, cough, nausea or vomiting. She states that her incisions all look well.   Fever  This is a new problem. The current episode started today (at 1700 ). The maximum temperature noted was 102 to 102.9 F. The temperature was taken using a tympanic thermometer. Pertinent negatives include no abdominal pain, coughing, diarrhea, nausea, urinary pain or vomiting. She has tried nothing (last took tylenol at 1900) for the symptoms.    Past Medical History  Diagnosis Date  . Dysplasia of skin   . Depression   . Frequent headaches   . Migraines     numbness on side of face with them  . Cystocele   . Arthritis   . Migraine headache with aura 2012  . GERD (gastroesophageal reflux disease)     Past Surgical History  Procedure Laterality Date  . Cholecystectomy  2008  . Tubal ligation    . Uterine ablation  2/13  . Orif finger fracture    . Endometrial biopsy      neg per patient  . Colonoscopy    . Robotic assisted total hysterectomy N/A 01/12/2015    Procedure: ROBOTIC ASSISTED TOTAL HYSTERECTOMY;  Surgeon: Megan Salon, MD;  Location: [facility];  Service: Gynecology;  Laterality: N/A;  . Bilateral salpingectomy Bilateral 01/12/2015    Procedure: BILATERAL SALPINGECTOMY;  Surgeon: Megan Salon, MD;  Location: [facility];  Service: Gynecology;  Laterality: Bilateral;  . Cystoscopy N/A 01/12/2015    Procedure: CYSTOSCOPY;  Surgeon: Megan Salon, MD;  Location: [facility];  Service: Gynecology;  Laterality: N/A;    Family History  Problem Relation Age of Onset  . Arthritis Mother   .  Hypertension Mother   . Hyperlipidemia Mother   . Stroke Mother   . Arthritis Father   . Hyperlipidemia Father   . Hypertension Father   . Diabetes Father   . Cancer Maternal Aunt     melanoma  . Cancer Paternal Uncle     melanoma  . Arthritis Maternal Grandmother   . Alcohol abuse Maternal Grandfather   . Squamous cell carcinoma Maternal Grandfather   . Arthritis Paternal Grandmother   . Cancer Paternal Grandmother     melanoma  . Alcohol abuse Paternal Grandfather   . Squamous cell carcinoma Maternal Aunt   . Colon cancer Paternal Uncle   . Liver cancer Paternal Uncle     History  Substance Use Topics  . Smoking status: Former Smoker    Quit date: 09/22/2000  . Smokeless tobacco: Never Used  . Alcohol Use: No    Allergies: No Known Allergies  Prescriptions prior to admission  Medication Sig Dispense Refill Last Dose  . acetaminophen (TYLENOL) 500 MG tablet Take 500 mg by mouth every 6 (six) hours as needed.   01/14/2015 at Unknown time  . erythromycin ophthalmic ointment Place into the left eye every 6 (six) hours. Stop after 5 days 3.5 g 0 01/14/2015 at Unknown time  . Famotidine (PEPCID PO) Take by mouth as needed.   Past Week  at Unknown time  . ibuprofen (ADVIL,MOTRIN) 800 MG tablet Take 1 tablet (800 mg total) by mouth every 8 (eight) hours as needed. 30 tablet 0 01/14/2015 at Unknown time  . ketorolac (ACULAR) 0.5 % ophthalmic solution Place 1 drop into the left eye every 6 (six) hours. 5 mL 0 01/14/2015 at Unknown time  . Multiple Vitamins-Minerals (MULTIVITAMIN PO) Take 1 tablet by mouth daily.   Past Week at Unknown time  . oxyCODONE-acetaminophen (PERCOCET) 5-325 MG per tablet Take 2 tablets by mouth every 4 (four) hours as needed. use only as much as needed to relieve pain 30 tablet 0 01/14/2015 at Unknown time    Review of Systems  Constitutional: Positive for fever.  Respiratory: Negative for cough.   Gastrointestinal: Negative for nausea, vomiting, abdominal  pain, diarrhea and constipation.  Genitourinary: Negative for dysuria, urgency and frequency.   Physical Exam   Blood pressure 124/79, pulse 109, temperature 101.5 F (38.6 C), temperature source Oral, resp. rate 18, height 5\' 6"  (1.676 m), weight 114.76 kg (253 lb), last menstrual period 12/24/2014, SpO2 97 %.  Physical Exam  Nursing note and vitals reviewed. Constitutional: She is oriented to person, place, and time. She appears well-developed and well-nourished. No distress.  Cardiovascular: Normal rate.   Respiratory: Effort normal.  GI: Soft. There is tenderness (mildly tenderness between umbilicus and symphysis ).  Neurological: She is alert and oriented to person, place, and time.  Skin: Skin is warm and dry.  Psychiatric: She has a normal mood and affect.    Results for orders placed or performed during the hospital encounter of 01/14/15 (from the past 24 hour(s))  Urinalysis, Routine w reflex microscopic (not at [facility])     Status: Abnormal   Collection Time: 01/14/15 10:25 PM  Result Value Ref Range   Color, Urine YELLOW YELLOW   APPearance CLEAR CLEAR   Specific Gravity, Urine 1.025 1.005 - 1.030   pH 5.5 5.0 - 8.0   Glucose, UA NEGATIVE NEGATIVE mg/dL   Hgb urine dipstick SMALL (A) NEGATIVE   Bilirubin Urine NEGATIVE NEGATIVE   Ketones, ur NEGATIVE NEGATIVE mg/dL   Protein, ur NEGATIVE NEGATIVE mg/dL   Urobilinogen, UA 0.2 0.0 - 1.0 mg/dL   Nitrite NEGATIVE NEGATIVE   Leukocytes, UA TRACE (A) NEGATIVE  Urine microscopic-add on     Status: Abnormal   Collection Time: 01/14/15 10:25 PM  Result Value Ref Range   Squamous Epithelial / LPF FEW (A) RARE   WBC, UA 0-2 <3 WBC/hpf   RBC / HPF 3-6 <3 RBC/hpf   Bacteria, UA FEW (A) RARE  CBC with Differential     Status: Abnormal   Collection Time: 01/14/15 11:10 PM  Result Value Ref Range   WBC 13.5 (H) 4.0 - 10.5 K/uL   RBC 3.94 3.87 - 5.11 MIL/uL   Hemoglobin 11.5 (L) 12.0 - 15.0 g/dL   HCT 34.4 (L) 36.0 - 46.0 %    MCV 87.3 78.0 - 100.0 fL   MCH 29.2 26.0 - 34.0 pg   MCHC 33.4 30.0 - 36.0 g/dL   RDW 13.6 11.5 - 15.5 %   Platelets 179 150 - 400 K/uL   Neutrophils Relative % 79 (H) 43 - 77 %   Neutro Abs 10.7 (H) 1.7 - 7.7 K/uL   Lymphocytes Relative 15 12 - 46 %   Lymphs Abs 2.0 0.7 - 4.0 K/uL   Monocytes Relative 4 3 - 12 %   Monocytes Absolute 0.6 0.1 - 1.0 K/uL  Eosinophils Relative 2 0 - 5 %   Eosinophils Absolute 0.2 0.0 - 0.7 K/uL   Basophils Relative 0 0 - 1 %   Basophils Absolute 0.0 0.0 - 0.1 K/uL   .Dg Chest 2 View  01/14/2015   CLINICAL DATA:  Hysterectomy 01/12/2015.  Postoperative fever.  EXAM: CHEST  2 VIEW  COMPARISON:  None.  FINDINGS: Normal heart size and mediastinal contours. No acute infiltrate or edema. No effusion or pneumothorax. No acute osseous findings. Cholecystectomy.  IMPRESSION: Negative chest.   Electronically Signed   By: Monte Fantasia M.D.   On: 01/14/2015 23:16    MAU Course  Procedures  MDM 2333: D/W Dr. Sabra Heck she will come see the patient   Assessment and Plan  Post op fever 3rd floor for Obs   Mathis Bud 01/14/2015, 11:23 PM

## 2015-01-15 ENCOUNTER — Encounter (HOSPITAL_COMMUNITY): Payer: Self-pay | Admitting: Obstetrics & Gynecology

## 2015-01-15 DIAGNOSIS — R5082 Postprocedural fever: Secondary | ICD-10-CM | POA: Diagnosis present

## 2015-01-15 LAB — COMPREHENSIVE METABOLIC PANEL
ALK PHOS: 51 U/L (ref 38–126)
ALT: 30 U/L (ref 14–54)
AST: 25 U/L (ref 15–41)
Albumin: 3.2 g/dL — ABNORMAL LOW (ref 3.5–5.0)
Anion gap: 8 (ref 5–15)
BUN: 12 mg/dL (ref 6–20)
CALCIUM: 7.9 mg/dL — AB (ref 8.9–10.3)
CO2: 25 mmol/L (ref 22–32)
CREATININE: 0.75 mg/dL (ref 0.44–1.00)
Chloride: 103 mmol/L (ref 101–111)
Glucose, Bld: 96 mg/dL (ref 65–99)
POTASSIUM: 3.8 mmol/L (ref 3.5–5.1)
Sodium: 136 mmol/L (ref 135–145)
Total Bilirubin: 0.4 mg/dL (ref 0.3–1.2)
Total Protein: 5.8 g/dL — ABNORMAL LOW (ref 6.5–8.1)

## 2015-01-15 LAB — CBC
HCT: 33.1 % — ABNORMAL LOW (ref 36.0–46.0)
Hemoglobin: 11.1 g/dL — ABNORMAL LOW (ref 12.0–15.0)
MCH: 29.1 pg (ref 26.0–34.0)
MCHC: 33.5 g/dL (ref 30.0–36.0)
MCV: 86.9 fL (ref 78.0–100.0)
PLATELETS: 174 10*3/uL (ref 150–400)
RBC: 3.81 MIL/uL — ABNORMAL LOW (ref 3.87–5.11)
RDW: 13.5 % (ref 11.5–15.5)
WBC: 11.9 10*3/uL — ABNORMAL HIGH (ref 4.0–10.5)

## 2015-01-15 MED ORDER — SIMETHICONE 80 MG PO CHEW: 80.0000 mg | CHEWABLE_TABLET | Freq: Four times a day (QID) | ORAL | Status: DC | PRN

## 2015-01-15 MED ORDER — ACETAMINOPHEN 325 MG PO TABS
650.0000 mg | ORAL_TABLET | Freq: Four times a day (QID) | ORAL | Status: DC | PRN
  Administered 2015-01-15: 325 mg via ORAL
  Administered 2015-01-15 – 2015-01-16 (×3): 650 mg via ORAL
  Filled 2015-01-15 (×4): qty 2

## 2015-01-15 MED ORDER — POLYETHYLENE GLYCOL 3350 17 G PO PACK
17.0000 g | PACK | Freq: Every day | ORAL | Status: DC
  Administered 2015-01-15: 17 g via ORAL
  Filled 2015-01-15: qty 1

## 2015-01-15 MED ORDER — IBUPROFEN 800 MG PO TABS
800.0000 mg | ORAL_TABLET | Freq: Once | ORAL | Status: AC
  Administered 2015-01-15: 800 mg via ORAL
  Filled 2015-01-15: qty 1

## 2015-01-15 MED ORDER — POLYETHYLENE GLYCOL 3350 17 G PO PACK: 17.0000 g | PACK | Freq: Every day | ORAL | Status: DC | PRN

## 2015-01-15 MED ORDER — IBUPROFEN 800 MG PO TABS: 800.0000 mg | ORAL_TABLET | Freq: Three times a day (TID) | ORAL | Status: DC

## 2015-01-15 MED ORDER — OXYCODONE-ACETAMINOPHEN 5-325 MG PO TABS
1.0000 | ORAL_TABLET | ORAL | Status: DC | PRN
  Administered 2015-01-15: 1 via ORAL
  Filled 2015-01-15: qty 1

## 2015-01-15 MED ORDER — IBUPROFEN 800 MG PO TABS
800.0000 mg | ORAL_TABLET | Freq: Three times a day (TID) | ORAL | Status: DC | PRN
  Administered 2015-01-15 – 2015-01-16 (×4): 800 mg via ORAL
  Filled 2015-01-15 (×4): qty 1

## 2015-01-15 MED ORDER — OXYCODONE-ACETAMINOPHEN 5-325 MG PO TABS
1.0000 | ORAL_TABLET | Freq: Once | ORAL | Status: AC
  Administered 2015-01-15: 1 via ORAL
  Filled 2015-01-15: qty 1

## 2015-01-15 NOTE — H&P (Signed)
Diane Scott is an 39 y.o. female  G2P2 MWF who is 2 days post op from Robotic TLH/bilateral salpingectomy/cystoscopy who called me at about 10:30pm tonight reporting having an episode of chills in the late evening followed by feeling very hot and flushed.  She took her temp and it was 102.0.  Pt had benign post operative course and went home yesterday AM.  I actually spoke with her earlier today to give her her pathology report.  She reported some pain as she was trying not to take any pain medication.  Pt was advised to use her narcotics as this will help her breath better and rest better.  Pt reports she did take Percocet in late morning and then later in the day.  She is voiding without difficulty.  She is passing flatus.  She has not had a bowel movement but was going to start Miralax tomorrow.  Denies SOB, CP, cough, sputum production, vaginal bleeding, vaginal discharge, drainage from incisions, back pain, leg swelling or pain.  Her only complaint is her fever and that she stubbed her fourth toe on the left foot and it is now bruised.    Pertinent Gynecological History: Menses: s/p robotic TLH Bleeding: none Contraception: hysterectomy DES exposure: denies Blood transfusions: none Sexually transmitted diseases: no past history OB History: G2, P2   Menstrual History: Patient's last menstrual period was 12/24/2014.    Past Medical History  Diagnosis Date  . Dysplasia of skin   . Depression   . Frequent headaches   . Migraines     numbness on side of face with them  . Cystocele   . Arthritis   . Migraine headache with aura 2012  . GERD (gastroesophageal reflux disease)     Past Surgical History  Procedure Laterality Date  . Cholecystectomy  2008  . Tubal ligation    . Uterine ablation  2/13  . Orif finger fracture    . Endometrial biopsy      neg per patient  . Colonoscopy    . Robotic assisted total hysterectomy N/A 01/12/2015    Procedure: ROBOTIC ASSISTED TOTAL  HYSTERECTOMY;  Surgeon: Megan Salon, MD;  Location: [facility];  Service: Gynecology;  Laterality: N/A;  . Bilateral salpingectomy Bilateral 01/12/2015    Procedure: BILATERAL SALPINGECTOMY;  Surgeon: Megan Salon, MD;  Location: [facility];  Service: Gynecology;  Laterality: Bilateral;  . Cystoscopy N/A 01/12/2015    Procedure: CYSTOSCOPY;  Surgeon: Megan Salon, MD;  Location: [facility];  Service: Gynecology;  Laterality: N/A;    Family History  Problem Relation Age of Onset  . Arthritis Mother   . Hypertension Mother   . Hyperlipidemia Mother   . Stroke Mother   . Arthritis Father   . Hyperlipidemia Father   . Hypertension Father   . Diabetes Father   . Cancer Maternal Aunt     melanoma  . Cancer Paternal Uncle     melanoma  . Arthritis Maternal Grandmother   . Alcohol abuse Maternal Grandfather   . Squamous cell carcinoma Maternal Grandfather   . Arthritis Paternal Grandmother   . Cancer Paternal Grandmother     melanoma  . Alcohol abuse Paternal Grandfather   . Squamous cell carcinoma Maternal Aunt   . Colon cancer Paternal Uncle   . Liver cancer Paternal Uncle     Social History:  reports that she quit smoking about 14 years ago. She has never used smokeless tobacco. She reports that she does not  drink alcohol or use illicit drugs.  Allergies: No Known Allergies  Prescriptions prior to admission  Medication Sig Dispense Refill Last Dose  . acetaminophen (TYLENOL) 500 MG tablet Take 500 mg by mouth every 6 (six) hours as needed.   01/14/2015 at Unknown time  . erythromycin ophthalmic ointment Place into the left eye every 6 (six) hours. Stop after 5 days 3.5 g 0 01/14/2015 at Unknown time  . Famotidine (PEPCID PO) Take by mouth as needed.   Past Week at Unknown time  . ibuprofen (ADVIL,MOTRIN) 800 MG tablet Take 1 tablet (800 mg total) by mouth every 8 (eight) hours as needed. 30 tablet 0 01/14/2015 at Unknown time  . ketorolac (ACULAR) 0.5 % ophthalmic solution Place 1 drop into  the left eye every 6 (six) hours. 5 mL 0 01/14/2015 at Unknown time  . Multiple Vitamins-Minerals (MULTIVITAMIN PO) Take 1 tablet by mouth daily.   Past Week at Unknown time  . oxyCODONE-acetaminophen (PERCOCET) 5-325 MG per tablet Take 2 tablets by mouth every 4 (four) hours as needed. use only as much as needed to relieve pain 30 tablet 0 01/14/2015 at Unknown time    Review of Systems  Constitutional: Positive for fever and chills.  HENT: Negative for sore throat.   Respiratory: Negative for cough, shortness of breath and wheezing.   Cardiovascular: Negative for chest pain, palpitations and leg swelling.  Gastrointestinal: Negative for nausea, vomiting, abdominal pain and diarrhea.  Genitourinary: Negative for dysuria, urgency, frequency and hematuria.  Musculoskeletal: Negative for myalgias.  Neurological: Negative.   Endo/Heme/Allergies: Does not bruise/bleed easily.  All other systems reviewed and are negative.   Blood pressure 124/79, pulse 109, temperature 99.1 F (37.3 C), temperature source Oral, resp. rate 18, height 5\' 6"  (1.676 m), weight 253 lb (114.76 kg), last menstrual period 12/24/2014, SpO2 97 %. Physical Exam  Constitutional: She is oriented to person, place, and time. She appears well-developed and well-nourished.  HENT:  Head: Normocephalic and atraumatic.  Neck: Normal range of motion. Neck supple.  Cardiovascular: Normal rate, regular rhythm, normal heart sounds and intact distal pulses.   Respiratory: Effort normal. No respiratory distress. She has no wheezes. She has no rales.  Mild decreased BS's in RLL  GI: Soft. Bowel sounds are normal. She exhibits no distension and no mass. Tenderness: but appropriate for two days post-op. There is no rebound and no guarding.  Inc:  C/D/I  Genitourinary: Vagina normal. There is no rash, tenderness or lesion on the right labia. There is no rash, tenderness or lesion on the left labia. No bleeding in the vagina. No vaginal  discharge found.  Uterus and cervix absent.  Cuff intact.  No bleeding.  Scant d/c appropriate for pt who had TLH two days ago.  No odor.  Musculoskeletal: Normal range of motion.       Right knee: She exhibits no swelling.       Left knee: She exhibits no swelling.       Right ankle: She exhibits no swelling.       Left ankle: She exhibits no swelling.  No calf tenderness on either side.  Neurological: She is alert and oriented to person, place, and time.    Results for orders placed or performed during the hospital encounter of 01/14/15 (from the past 24 hour(s))  Urinalysis, Routine w reflex microscopic (not at [facility])     Status: Abnormal   Collection Time: 01/14/15 10:25 PM  Result Value Ref Range   Color,  Urine YELLOW YELLOW   APPearance CLEAR CLEAR   Specific Gravity, Urine 1.025 1.005 - 1.030   pH 5.5 5.0 - 8.0   Glucose, UA NEGATIVE NEGATIVE mg/dL   Hgb urine dipstick SMALL (A) NEGATIVE   Bilirubin Urine NEGATIVE NEGATIVE   Ketones, ur NEGATIVE NEGATIVE mg/dL   Protein, ur NEGATIVE NEGATIVE mg/dL   Urobilinogen, UA 0.2 0.0 - 1.0 mg/dL   Nitrite NEGATIVE NEGATIVE   Leukocytes, UA TRACE (A) NEGATIVE  Urine microscopic-add on     Status: Abnormal   Collection Time: 01/14/15 10:25 PM  Result Value Ref Range   Squamous Epithelial / LPF FEW (A) RARE   WBC, UA 0-2 <3 WBC/hpf   RBC / HPF 3-6 <3 RBC/hpf   Bacteria, UA FEW (A) RARE  CBC with Differential     Status: Abnormal   Collection Time: 01/14/15 11:10 PM  Result Value Ref Range   WBC 13.5 (H) 4.0 - 10.5 K/uL   RBC 3.94 3.87 - 5.11 MIL/uL   Hemoglobin 11.5 (L) 12.0 - 15.0 g/dL   HCT 34.4 (L) 36.0 - 46.0 %   MCV 87.3 78.0 - 100.0 fL   MCH 29.2 26.0 - 34.0 pg   MCHC 33.4 30.0 - 36.0 g/dL   RDW 13.6 11.5 - 15.5 %   Platelets 179 150 - 400 K/uL   Neutrophils Relative % 79 (H) 43 - 77 %   Neutro Abs 10.7 (H) 1.7 - 7.7 K/uL   Lymphocytes Relative 15 12 - 46 %   Lymphs Abs 2.0 0.7 - 4.0 K/uL   Monocytes Relative 4 3 - 12  %   Monocytes Absolute 0.6 0.1 - 1.0 K/uL   Eosinophils Relative 2 0 - 5 %   Eosinophils Absolute 0.2 0.0 - 0.7 K/uL   Basophils Relative 0 0 - 1 %   Basophils Absolute 0.0 0.0 - 0.1 K/uL  Comprehensive metabolic panel     Status: Abnormal   Collection Time: 01/14/15 11:10 PM  Result Value Ref Range   Sodium 136 135 - 145 mmol/L   Potassium 3.8 3.5 - 5.1 mmol/L   Chloride 103 101 - 111 mmol/L   CO2 25 22 - 32 mmol/L   Glucose, Bld 96 65 - 99 mg/dL   BUN 12 6 - 20 mg/dL   Creatinine, Ser 0.75 0.44 - 1.00 mg/dL   Calcium 7.9 (L) 8.9 - 10.3 mg/dL   Total Protein 5.8 (L) 6.5 - 8.1 g/dL   Albumin 3.2 (L) 3.5 - 5.0 g/dL   AST 25 15 - 41 U/L   ALT 30 14 - 54 U/L   Alkaline Phosphatase 51 38 - 126 U/L   Total Bilirubin 0.4 0.3 - 1.2 mg/dL   GFR calc non Af Amer >60 >60 mL/min   GFR calc Af Amer >60 >60 mL/min   Anion gap 8 5 - 15    Dg Chest 2 View  01/14/2015   CLINICAL DATA:  Hysterectomy 01/12/2015.  Postoperative fever.  EXAM: CHEST  2 VIEW  COMPARISON:  None.  FINDINGS: Normal heart size and mediastinal contours. No acute infiltrate or edema. No effusion or pneumothorax. No acute osseous findings. Cholecystectomy.  IMPRESSION: Negative chest.   Electronically Signed   By: Monte Fantasia M.D.   On: 01/14/2015 23:16    Assessment: 39 yo G2P2 MWF with post op temp 102.  Has decreased since in MAU.  Neg CXR.  WBC ct post op 19 and now 13.5.  Hb stable.  Negative  exam.  Plan:  Admit for observation.  Will treat post operative pain.  SCDs will be initiated.  Temp curve and vitals will be monitored.  Repeat CBC in AM.  If improving, will monitor.  Will adjust plan based on changed in physical exam and/or symptoms.  [name] 01/15/2015, 12:21 AM

## 2015-01-15 NOTE — Progress Notes (Signed)
POD:3/HD #1  Subjective: Patient reports she feels like a wet rag but not specific complaints.  Still passing gas, voiding well, no specific pains except typical post op pain, no SOB, no CP.  Eating.  Pt feels like she might have had another temp around 5am but last visit signs were taken at 510 and temp was 98.9.  Pulse was 85.  BP was 108/45  Objective: I have reviewed patient's vital signs, intake and output, medications and labs.  General: alert and cooperative Resp: clear to auscultation bilaterally Cardio: regular rate and rhythm, S1, S2 normal, no murmur, click, rub or gallop GI: soft, non-tender; bowel sounds normal; no masses,  no organomegaly and incision: clean, dry and intact Extremities: extremities normal, atraumatic, no cyanosis or edema and and SCDs on and functioning Vaginal Bleeding: none   Assessment/Plan: POD #3 from robotic TLH/bilateral salpingectomy/cystoscopy with temp of 102.0 at home and 101.5 here.  No additional documented temps since.  LOS: 0 days    Hale Bogus [facility] 01/15/2015, 8:48 AM

## 2015-01-15 NOTE — MAU Note (Signed)
Dressing taken off of op-site by Dr. Sabra Heck. Dr. Tomasita Crumble breath sounds and abdominal tenderness/incision healing.

## 2015-01-15 NOTE — Progress Notes (Signed)
Subjective: Patient reports she is still having episodes of feeling hot.  No nausea.  Good pain control.  Ambulating without difficulty.  Urinating without problems.  No vaginal bleeding.  Eating well--said she didn't eat a good dinner--[employer] chicken mcnuggets and fries.  Continues to have flatus.  Objective: I have reviewed patient's vital signs, intake and output, medications and labs. Tc 99.6, Pulse 80, Resp 18, Pox 99% on RA  General: alert and cooperative Resp: clear to auscultation bilaterally Cardio: regular rate and rhythm, S1, S2 normal, no murmur, click, rub or gallop GI: soft, non-tender; bowel sounds normal; no masses,  no organomegaly and incision: clean, dry and intact Extremities: extremities normal, atraumatic, no cyanosis or edema Vaginal Bleeding: none  Assessment/Plan: POD # 3 from Robotic TLH/bilateral salpingectomy/cytoscopy with post op temp of 101.5 here last night in MAU.  Neg u/a, neg CXR, WBC ct down from 19 to 11.9  Continue monitoring.  Repeat CBC in AM.  Starting Miralax for constipation.      [name] 01/15/2015, 7:12 PM

## 2015-01-16 LAB — CBC
HCT: 34.5 % — ABNORMAL LOW (ref 36.0–46.0)
Hemoglobin: 11.4 g/dL — ABNORMAL LOW (ref 12.0–15.0)
MCH: 28.8 pg (ref 26.0–34.0)
MCHC: 33 g/dL (ref 30.0–36.0)
MCV: 87.1 fL (ref 78.0–100.0)
Platelets: 168 10*3/uL (ref 150–400)
RBC: 3.96 MIL/uL (ref 3.87–5.11)
RDW: 13.4 % (ref 11.5–15.5)
WBC: 9.7 10*3/uL (ref 4.0–10.5)

## 2015-01-16 NOTE — Discharge Summary (Signed)
Physician Discharge Summary  Patient ID: Diane Scott MRN: [id number] DOB/AGE: February 22, 1976 39 y.o.  Admit date: 01/14/2015 Discharge date: 01/16/2015  Admission Diagnoses:  Post op fever  Discharge Diagnoses:  Active Problems:   Fever postop   Discharged Condition: good  Hospital Course: Pt admitted for observation in evening of 01/14/15 after having temp of 102.0 at home on POD#2 after robotic TLH/bilateral salpingectomy/cystoscopy.  Pt was seen in MAU and had temp of 101.5.  WBC ct was 13.5 (decreased from 19.0 on POD#1).  Hb was stable.  Electrolytes were stable.  Pulse was mildly elevated.  CXR and u/a was negative.  Exam was benign.  She was passing flatus and voiding without difficulty.  Vitals were good including pulse ox >95% on RA.  Due to temp, she was observed for almost 48 hours.  Repeat CBCs were performed and WBC ct normalized from 19.0 on PoD #1 to 9.7 by POD #4.  Exam continued to be benign.  Pt had a bowel movement while in the hospital.  She did continue having some low grade temps but not anything >99.9.  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  Strict precautions for calling me were given and she was given my direct number.    Consults: None  Significant Diagnostic Studies: labs: WBC ct normalized to 9.7 this am  Treatments: observation  Discharge Exam: Blood pressure 100/52, pulse 97, temperature 99.9 F (37.7 C), temperature source Oral, resp. rate 16, height 5\' 6"  (1.676 m), weight 253 lb (114.76 kg), last menstrual period 12/24/2014, SpO2 95 %. General appearance: alert and cooperative Resp: clear to auscultation bilaterally Cardio: regular rate and rhythm, S1, S2 normal, no murmur, click, rub or gallop GI: soft, non-tender; bowel sounds normal; no masses,  no organomegaly Extremities: extremities normal, atraumatic, no cyanosis or  edema Incision/Wound:c/d/i  Disposition: 01-Home or Self Care     Medication List    TAKE these medications        acetaminophen 500 MG tablet  Commonly known as:  TYLENOL  Take 500 mg by mouth every 6 (six) hours as needed for mild pain or fever.     erythromycin ophthalmic ointment  Place into the left eye every 6 (six) hours. Stop after 5 days     ibuprofen 800 MG tablet  Commonly known as:  ADVIL,MOTRIN  Take 1 tablet (800 mg total) by mouth every 8 (eight) hours as needed.     ketorolac 0.5 % ophthalmic solution  Commonly known as:  ACULAR  Place 1 drop into the left eye every 6 (six) hours.     MULTIVITAMIN PO  Take 1 tablet by mouth daily.     oxyCODONE-acetaminophen 5-325 MG per tablet  Commonly known as:  PERCOCET  Take 2 tablets by mouth every 4 (four) hours as needed. use only as much as needed to relieve pain     PEPCID PO  Take 1 tablet by mouth daily as needed (for refulx).           Follow-up Information    Follow up with Lyman Speller, MD On 01/20/2015.   Specialty:  Gynecology   Why:  Pt already has appt   Contact information:   [address] [place] [address] [phone]       Signed: Lyman Speller 01/16/2015, 2:23 PM

## 2015-01-16 NOTE — Progress Notes (Signed)
Pt is discharged in the care of husband. Downstairs per ambulatory. Stable Denies pain or discomfort. Understands all discharged instructions Stable. Spirits are good. Remains without temp.

## 2015-01-16 NOTE — Progress Notes (Signed)
Subjective: Patient reports feeling better.  Good appetite.  Ambulating well.  Voiding well.  Continues to have no VB, vaginal discharge, SOB, Chest pain.  No drainage from incisions.  No leg swelling she has noticed.  Took miralax last night.  Feeling a lot of rumbling.  Still passing gas.  Good pain control.  Objective: I have reviewed patient's vital signs, intake and output, medications and labs. Tc 99.5, P 84, R 16, pulse ox 97% %RA CBC hb 11.4, WBC 9.7  General: alert and cooperative Resp: clear to auscultation bilaterally Cardio: regular rate and rhythm, S1, S2 normal, no murmur, click, rub or gallop GI: soft, non-tender; bowel sounds normal; no masses,  no organomegaly and incision: clean, dry and intact Extremities: extremities normal, atraumatic, no cyanosis or edema Vaginal Bleeding: none   Assessment/Plan: 4 days post op from robotic TLH/bilateral salpingectomy/cystoscopy with temp 101.5 48 hrs post op.  Low grade temps since but normal exam and decreasing WBC ct.  Normal u/a.  Neg CXR.    Will monitor today and possible D/C later today.  [name] 01/16/2015, 8:15 AM

## 2015-01-16 NOTE — Discharge Instructions (Signed)
Please feel free to call me over the weekend at [phone] (my pager) if you need me for anything.  Also, I will check in with you over the weekend as well.

## 2015-01-20 ENCOUNTER — Encounter: Payer: Self-pay | Admitting: Obstetrics & Gynecology

## 2015-01-20 ENCOUNTER — Ambulatory Visit (INDEPENDENT_AMBULATORY_CARE_PROVIDER_SITE_OTHER): Payer: No Typology Code available for payment source | Admitting: Obstetrics & Gynecology

## 2015-01-20 ENCOUNTER — Other Ambulatory Visit: Payer: Self-pay | Admitting: Physician Assistant

## 2015-01-20 VITALS — BP 120/76 | HR 68 | Temp 98.2°F | Resp 16 | Wt 246.4 lb

## 2015-01-20 DIAGNOSIS — Z9889 Other specified postprocedural states: Secondary | ICD-10-CM

## 2015-01-20 DIAGNOSIS — D229 Melanocytic nevi, unspecified: Secondary | ICD-10-CM

## 2015-01-20 HISTORY — PX: LEG SKIN LESION  BIOPSY / EXCISION: SUR473

## 2015-01-20 HISTORY — DX: Melanocytic nevi, unspecified: D22.9

## 2015-01-20 NOTE — Progress Notes (Signed)
Post Operative Visit  Procedure: robotic TLH/bilateral salpingectomy/cystoscopy Days Post-op: 7  Subjective: Pt reports she's had an uneventful weekend.  She was hospitalized for almost 48 hours last week after having a temp of 101.5 about 48 hrs post op.  Having some urinary leakage vs vaginal discharge.  This is watery and without pain.  No odor.  Pt took her last Percocet Saturday night.  She is currently just taking Motrin 800mg  every 8 hrs with some Tylenol as needed.  Still thinks she may be having some hot flashes but she has taken her temp several times and it has been normal.  She does feel like she is having some night sweats as well.  Objective: BP 120/76 mmHg  Pulse 68  Temp(Src) 98.2 F (36.8 C) (Oral)  Resp 16  Wt 246 lb 6.4 oz (111.766 kg)  LMP 12/24/2014  EXAM General: alert and cooperative Resp: clear to auscultation bilaterally Cardio: regular rate and rhythm, S1, S2 normal, no murmur, click, rub or gallop GI: soft, non-tender; bowel sounds normal; no masses,  no organomegaly Extremities: extremities normal, atraumatic, no cyanosis or edema Vaginal Bleeding: watery pinkish discharge noted  Assessment: s/p Robotic TLH/bilateral salpingectomy/cystoscopy  Plan: Recheck 3 weeks

## 2015-01-30 ENCOUNTER — Ambulatory Visit (INDEPENDENT_AMBULATORY_CARE_PROVIDER_SITE_OTHER): Payer: PRIVATE HEALTH INSURANCE | Admitting: Family Medicine

## 2015-01-30 ENCOUNTER — Encounter: Payer: Self-pay | Admitting: Family Medicine

## 2015-01-30 ENCOUNTER — Encounter: Payer: Self-pay | Admitting: Internal Medicine

## 2015-01-30 VITALS — BP 102/82 | HR 105 | Temp 98.6°F | Ht 65.5 in | Wt 244.0 lb

## 2015-01-30 DIAGNOSIS — D369 Benign neoplasm, unspecified site: Secondary | ICD-10-CM

## 2015-01-30 DIAGNOSIS — Z0000 Encounter for general adult medical examination without abnormal findings: Secondary | ICD-10-CM

## 2015-01-30 LAB — HEMOGLOBIN A1C: HEMOGLOBIN A1C: 5.7 % (ref 4.6–6.5)

## 2015-01-30 LAB — LIPID PANEL
CHOLESTEROL: 192 mg/dL (ref 0–200)
HDL: 36.5 mg/dL — ABNORMAL LOW (ref >39.00)
NonHDL: 155.5
Total CHOL/HDL Ratio: 5
Triglycerides: 348 mg/dL — ABNORMAL HIGH (ref 0.0–149.0)
VLDL: 69.6 mg/dL — ABNORMAL HIGH (ref 0.0–40.0)

## 2015-01-30 LAB — LDL CHOLESTEROL, DIRECT: LDL DIRECT: 105 mg/dL

## 2015-01-30 NOTE — Progress Notes (Signed)
HPI:  Here for CPE. Reports had hysterectomy due to abnormal bleeding. Recovery well. Had abnormal mole removed from L lower leg. Will be following up with dermatology for skin checks. She would like to check thyroid once recovered from recent surgery as has loss of outer eyelids.  -Concerns and/or follow up today: none  -Diet: variety of foods, balance and well rounded - lots of veggies and fruits  -Exercise: no regular exercise - not able to exercise now until cleared by gyn but plans to start exercising soon with husband.  -Taking folic acid, vitamin D: take multivit  -Diabetes and Dyslipidemia Screening: FASTING today for this  -Hx of HTN: no  -Vaccines: UTD  -pap history: sees gyn, s/p hysterectomy  -FDLMP: n/a  -sexual activity: yes, female partner, no new partners  -wants STI testing (Hep C if born [birth year]): declined  -FH breast, colon or ovarian ca: see FH Last mammogram: sees gyn for women's healthy Last colon cancer screening: reports uncle with FH colon ca, and she had colonoscopy about 7 years ago in [place] for GI issues and had adenomatous polyps and was told to repeat colonoscopy in 5 years.  -Alcohol, Tobacco, drug use: see social history  Review of Systems - no fevers, unintentional weight loss, vision loss, hearing loss, chest pain, sob, hemoptysis, melena, hematochezia, hematuria, genital discharge, changing or concerning skin lesions, bleeding, bruising, loc, thoughts of self harm or SI  Past Medical History  Diagnosis Date  . Dysplasia of skin   . Depression   . Frequent headaches   . Migraines     numbness on side of face with them  . Cystocele   . Arthritis   . Migraine headache with aura 2012  . GERD (gastroesophageal reflux disease)     Past Surgical History  Procedure Laterality Date  . Cholecystectomy  2008  . Tubal ligation    . Uterine ablation  2/13  . Orif finger fracture    . Endometrial biopsy      neg per patient  .  Colonoscopy    . Robotic assisted total hysterectomy N/A 01/12/2015    Procedure: ROBOTIC ASSISTED TOTAL HYSTERECTOMY;  Surgeon: Megan Salon, MD;  Location: [facility];  Service: Gynecology;  Laterality: N/A;  . Bilateral salpingectomy Bilateral 01/12/2015    Procedure: BILATERAL SALPINGECTOMY;  Surgeon: Megan Salon, MD;  Location: [facility];  Service: Gynecology;  Laterality: Bilateral;  . Cystoscopy N/A 01/12/2015    Procedure: CYSTOSCOPY;  Surgeon: Megan Salon, MD;  Location: [facility];  Service: Gynecology;  Laterality: N/A;  . Leg skin lesion  biopsy / excision Left 01/20/2015    [facility]-moderate dysplasia per patient    Family History  Problem Relation Age of Onset  . Arthritis Mother   . Hypertension Mother   . Hyperlipidemia Mother   . Stroke Mother   . Arthritis Father   . Hyperlipidemia Father   . Hypertension Father   . Diabetes Father   . Cancer Maternal Aunt     melanoma  . Cancer Paternal Uncle     melanoma  . Arthritis Maternal Grandmother   . Alcohol abuse Maternal Grandfather   . Squamous cell carcinoma Maternal Grandfather   . Arthritis Paternal Grandmother   . Cancer Paternal Grandmother     melanoma  . Alcohol abuse Paternal Grandfather   . Squamous cell carcinoma Maternal Aunt   . Colon cancer Paternal Uncle   . Liver cancer Paternal Uncle     History  Social History  . Marital Status: Married    Spouse Name: N/A  . Number of Children: N/A  . Years of Education: N/A   Social History Main Topics  . Smoking status: Former Smoker    Quit date: 09/22/2000  . Smokeless tobacco: Never Used  . Alcohol Use: No  . Drug Use: No  . Sexual Activity:    Partners: Male    Birth Control/ Protection: Surgical     Comment: BTL   Other Topics Concern  . 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  acetaminophen (TYLENOL) 500 MG tablet, Take 500 mg by mouth every 6 (six) hours as needed for mild pain or fever. , Disp: , Rfl:  .  ibuprofen (ADVIL,MOTRIN) 800 MG tablet, Take 1 tablet (800 mg total) by mouth every 8 (eight) hours as needed. (Patient taking differently: Take 800 mg by mouth every 8 (eight) hours as needed for moderate pain. ), Disp: 30 tablet, Rfl: 0  EXAM:  Filed Vitals:   01/30/15 0923  BP: 102/82  Pulse: 105  Temp: 98.6 F (37 C)    GENERAL: vitals reviewed and listed below, alert, oriented, appears well hydrated and in no acute distress  HEENT: head atraumatic, PERRLA, normal appearance of eyes, ears, nose and mouth. moist mucus membranes.  NECK: supple, no masses or lymphadenopathy  LUNGS: clear to auscultation bilaterally, no rales, rhonchi or wheeze  CV: HRRR, no peripheral edema or cyanosis, normal pedal pulses  BREAST: declined - does with gyn  ABDOMEN: bowel sounds normal, soft, non tender to palpation, no masses, no rebound or guarding  GU: does with gyn  RECTAL: refused  SKIN: no rash or abnormal lesions  MS/NEURO: normal gait, moves all extremities normally  PSYCH: normal affect, pleasant and cooperative  ASSESSMENT AND PLAN:  Discussed the following assessment and plan:  Visit for preventive health examination - Plan: Lipid Panel, Hemoglobin A1c  Tubular adenoma - Plan: Ambulatory referral to Gastroenterology   -Discussed and advised all [place] preventive services health task force level A and B recommendations for age, sex and risks.  -Advised at least 150 minutes of exercise per week and a healthy diet low in saturated fats and sweets and consisting of fresh fruits and vegetables, lean meats such as fish and white chicken and whole grains.  -FASTING labs, studies and vaccines per orders this encounter  She wants to check TSH after recovered from surgery due to thinning of  eyebrows. Will call in 3 months for this screening.  Orders Placed This Encounter  Procedures  . Lipid Panel  . Hemoglobin A1c  . Ambulatory referral to Gastroenterology    Referral Priority:  Routine    Referral Type:  Consultation    Referral Reason:  Specialty Services Required    Number of Visits Requested:  1    Patient advised to return to clinic immediately if symptoms worsen or persist or new concerns.  There are no Patient Instructions on file for this visit.  No Follow-up on file.  Colin Benton R.

## 2015-01-30 NOTE — Progress Notes (Signed)
Pre visit review using our clinic review tool, if applicable. No additional management support is needed unless otherwise documented below in the visit note. 

## 2015-01-30 NOTE — Patient Instructions (Signed)
BEFORE YOU LEAVE: -labs -physical in 1 year  -We have ordered labs or studies at this visit. It can take up to 1-2 weeks for results and processing. We will contact you with instructions IF your results are abnormal. Normal results will be released to your [facility]. If you have not heard from [place] or can not find your results in [facility] in 2 weeks please contact our office.   We recommend the following healthy lifestyle measures: - eat a healthy diet consisting of lots of vegetables, fruits, beans, nuts, seeds, healthy meats such as white chicken and fish and whole grains.  - avoid starches, sweets, fried foods, fast food, processed foods, sodas, red meet and other fattening foods.  - get a least 150 minutes of aerobic exercise per week.   Call when you want to check thyroid in about 3 months

## 2015-02-12 ENCOUNTER — Ambulatory Visit (INDEPENDENT_AMBULATORY_CARE_PROVIDER_SITE_OTHER): Payer: No Typology Code available for payment source | Admitting: Obstetrics & Gynecology

## 2015-02-12 ENCOUNTER — Telehealth: Payer: Self-pay | Admitting: Obstetrics & Gynecology

## 2015-02-12 VITALS — BP 108/72 | HR 60 | Resp 16 | Wt 248.0 lb

## 2015-02-12 DIAGNOSIS — Z9889 Other specified postprocedural states: Secondary | ICD-10-CM

## 2015-02-12 NOTE — Telephone Encounter (Signed)
Today's follow up notes state patient should follow up in one year with Dr Sabra Heck. Pt states this is for post operative check and her aex. Pt aware Dr Sabra Heck booked until 04/2016 and is ok to see D.Hollice Espy for her annual with Dr Sabra Heck stopping in the room to check her incision site. To clarify appointment type/date, forwarding to [facility] to check with Dr Sabra Heck on type of follow up to schedule.

## 2015-02-12 NOTE — Progress Notes (Signed)
Post Operative Visit  Procedure:Robotic TLH/bilateral salpingectomy/cystoscopy Days Post-op: 4 weeks  Subjective: Reports doing well.  Vaginal discharge has stopped.  Urinary incontinence is the same as before surgery.  Reports her energy is returning.  No vaginal bleeding.  Having some pulling sensation around incisions.    Objective: BP 108/72 mmHg  Pulse 60  Resp 16  Wt 248 lb (112.492 kg)  LMP 12/24/2014  EXAM General: alert and cooperative Resp: clear to auscultation bilaterally Cardio: regular rate and rhythm, S1, S2 normal, no murmur, click, rub or gallop GI: soft, non-tender; bowel sounds normal; no masses,  no organomegaly and incision: clean, dry and intact Extremities: extremities normal, atraumatic, no cyanosis or edema Vaginal Bleeding: none  Gyn:  NAEFG, no  Assessment: s/p Robotic TLH/bilateral salpingectomy/cystoscopy Mild SUI   Plan: Advised can return to full time work.  Paperwork done for pt. Advised no intercourse for 8 full weeks PT referral offered after that time for SUI.  Pt will let me know if desires.

## 2015-02-13 ENCOUNTER — Encounter: Payer: Self-pay | Admitting: Obstetrics & Gynecology

## 2015-02-16 NOTE — Telephone Encounter (Signed)
Dr Sabra Heck, I will call her to get this scheduled, but just wanted to make sure I know exactly what she needs. AEX in one year-should be with you? Please advise.//kn

## 2015-02-16 NOTE — Telephone Encounter (Signed)
AEX absolutely should be with me.  She is post op.

## 2015-04-07 ENCOUNTER — Ambulatory Visit (INDEPENDENT_AMBULATORY_CARE_PROVIDER_SITE_OTHER): Payer: PRIVATE HEALTH INSURANCE | Admitting: Internal Medicine

## 2015-04-07 ENCOUNTER — Encounter: Payer: Self-pay | Admitting: Internal Medicine

## 2015-04-07 VITALS — BP 104/74 | HR 80 | Ht 66.0 in | Wt 248.8 lb

## 2015-04-07 DIAGNOSIS — K589 Irritable bowel syndrome without diarrhea: Secondary | ICD-10-CM | POA: Diagnosis not present

## 2015-04-07 DIAGNOSIS — Z1211 Encounter for screening for malignant neoplasm of colon: Secondary | ICD-10-CM

## 2015-04-07 NOTE — Patient Instructions (Addendum)
Your appointment with Dr Silverio Decamp is scheduled on 06/17/2015 at 10:30am  Dr Maudie Mercury

## 2015-04-07 NOTE — Progress Notes (Signed)
Diane Scott 1975-10-17 [id number]  Note: This dictation was prepared with Dragon digital system. Any transcriptional errors that result from this procedure are unintentional.   History of Present Illness: This is a 39 year old white female here to schedule screening colonoscopy. She has a history of tubular adenoma on colonoscopy in 2009 which was done at [facility]. 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 She has a  history of mild anemia. Last hemoglobin 11.4 hematocrit 34.5. There is a positive family history of colon cancer in her paternal uncle who was diagnosed with stage IV colon cancer several years ago at age of 30. Her father had colon polyps. She underwent robotic assisted to TAH in May 2016. She denies any lower GI symptoms. There is a history of irritable bowel syndrome which is currently under good control. She has undergone  upper endoscopy in the past for gastroesophageal reflux for which she takes Pepcid 20 mg when necessary.    Past Medical History  Diagnosis Date  . Dysplasia of skin   . Depression   . Frequent headaches   . Migraines     numbness on side of face with them  . Cystocele   . Arthritis   . Migraine headache with aura 2012  . GERD (gastroesophageal reflux disease)     Past Surgical History  Procedure Laterality Date  . Cholecystectomy  2008  . Tubal ligation    . Uterine ablation  2/13  . Orif finger fracture    . Endometrial biopsy      neg per patient  . Colonoscopy    . Robotic assisted total hysterectomy N/A 01/12/2015    Procedure: ROBOTIC ASSISTED TOTAL HYSTERECTOMY;  Surgeon: Megan Salon, MD;  Location: [facility];  Service: Gynecology;  Laterality: N/A;  . Bilateral salpingectomy Bilateral 01/12/2015    Procedure: BILATERAL SALPINGECTOMY;  Surgeon: Megan Salon, MD;  Location: [facility];  Service: Gynecology;  Laterality: Bilateral;  . Cystoscopy N/A 01/12/2015    Procedure: CYSTOSCOPY;  Surgeon: Megan Salon, MD;   Location: [facility];  Service: Gynecology;  Laterality: N/A;  . Leg skin lesion  biopsy / excision Left 01/20/2015     Dermatology-moderate dysplasia per patient    No Known Allergies  Family history and social history have been reviewed.  Review of Systems: Negative for abdominal pain diarrhea constipation  The remainder of the 10 point ROS is negative except as outlined in the H&P  Physical Exam: General Appearance Well developed, in no distress Eyes  Non icteric  HEENT  Non traumatic, normocephalic  Mouth No lesion, tongue papillated, no cheilosis Neck Supple without adenopathy, thyroid not enlarged, no carotid bruits, no JVD Lungs Clear to auscultation bilaterally COR Normal S1, normal S2, regular rhythm, no murmur, quiet precordium Abdomen soft nontender obese, normoactive bowel sounds. Liver edge at costal margin. Normal on percussion. Splenic tip not palpable. Rectal deferred to colonoscopy Extremities  No pedal edema Skin No lesions Neurological Alert and oriented x 3 Psychological Normal mood and affect  Assessment and Plan:   39 year old white female who is due for screening colonoscopy because of  personal history of adenomatous polyp in 2009. We do not have a report of the colonoscopy which was done at [facility]. There is a family history of colon cancer in a indirect relatives and positive family history of colon polyps in her father. We will schedule  colonoscopy with Dr. Donneta Romberg 04/07/2015     .

## 2015-06-04 ENCOUNTER — Ambulatory Visit (INDEPENDENT_AMBULATORY_CARE_PROVIDER_SITE_OTHER)
Admission: RE | Admit: 2015-06-04 | Discharge: 2015-06-04 | Disposition: A | Discharge status: Home / Self Care | Payer: PRIVATE HEALTH INSURANCE | Source: Ambulatory Visit | Attending: Family Medicine | Admitting: Family Medicine

## 2015-06-04 ENCOUNTER — Encounter: Payer: Self-pay | Admitting: Family Medicine

## 2015-06-04 ENCOUNTER — Other Ambulatory Visit: Payer: Self-pay | Admitting: Family Medicine

## 2015-06-04 ENCOUNTER — Ambulatory Visit (INDEPENDENT_AMBULATORY_CARE_PROVIDER_SITE_OTHER): Payer: PRIVATE HEALTH INSURANCE | Admitting: Family Medicine

## 2015-06-04 VITALS — BP 112/80 | HR 120 | Temp 98.5°F | Ht 66.0 in | Wt 244.5 lb

## 2015-06-04 DIAGNOSIS — R059 Cough, unspecified: Secondary | ICD-10-CM

## 2015-06-04 DIAGNOSIS — R739 Hyperglycemia, unspecified: Secondary | ICD-10-CM | POA: Diagnosis not present

## 2015-06-04 DIAGNOSIS — R05 Cough: Secondary | ICD-10-CM | POA: Diagnosis not present

## 2015-06-04 DIAGNOSIS — I517 Cardiomegaly: Secondary | ICD-10-CM

## 2015-06-04 DIAGNOSIS — J069 Acute upper respiratory infection, unspecified: Secondary | ICD-10-CM | POA: Diagnosis not present

## 2015-06-04 DIAGNOSIS — E785 Hyperlipidemia, unspecified: Secondary | ICD-10-CM | POA: Diagnosis not present

## 2015-06-04 MED ORDER — HYDROCODONE-HOMATROPINE 5-1.5 MG/5ML PO SYRP: 5.0000 mL | ORAL_SOLUTION | Freq: Three times a day (TID) | ORAL | Status: DC | PRN

## 2015-06-04 MED ORDER — PREDNISONE 20 MG PO TABS: 40.0000 mg | ORAL_TABLET | Freq: Every day | ORAL | Status: DC

## 2015-06-04 MED ORDER — DOXYCYCLINE HYCLATE 100 MG PO TABS: 100.0000 mg | ORAL_TABLET | Freq: Two times a day (BID) | ORAL | Status: DC

## 2015-06-04 NOTE — Progress Notes (Signed)
Pre visit review using our clinic review tool, if applicable. No additional management support is needed unless otherwise documented below in the visit note. 

## 2015-06-04 NOTE — Progress Notes (Addendum)
HPI:  Acute visit for:  URI: -started about 2-3 weeks ago with nasal congestion drainage and cough -cough has persisted -denies: fevers, SOB, DOE, sinus pain, tooth pain -daughter with the same -denies hx of asthma  Follow up:  Prediabetes/Obesity.Hyperlipidemia: -wants to recheck labs  ROS: See pertinent positives and negatives per HPI.  Past Medical History  Diagnosis Date  . Dysplasia of skin   . Depression   . Frequent headaches   . Migraines     numbness on side of face with them  . Cystocele   . Arthritis   . Migraine headache with aura 2012  . GERD (gastroesophageal reflux disease)     Past Surgical History  Procedure Laterality Date  . Cholecystectomy  2008  . Tubal ligation    . Uterine ablation  2/13  . Orif finger fracture    . Endometrial biopsy      neg per patient  . Colonoscopy    . Robotic assisted total hysterectomy N/A 01/12/2015    Procedure: ROBOTIC ASSISTED TOTAL HYSTERECTOMY;  Surgeon: Megan Salon, MD;  Location: [facility];  Service: Gynecology;  Laterality: N/A;  . Bilateral salpingectomy Bilateral 01/12/2015    Procedure: BILATERAL SALPINGECTOMY;  Surgeon: Megan Salon, MD;  Location: [facility];  Service: Gynecology;  Laterality: Bilateral;  . Cystoscopy N/A 01/12/2015    Procedure: CYSTOSCOPY;  Surgeon: Megan Salon, MD;  Location: [facility];  Service: Gynecology;  Laterality: N/A;  . Leg skin lesion  biopsy / excision Left 01/20/2015    [facility]-moderate dysplasia per patient    Family History  Problem Relation Age of Onset  . Arthritis Mother   . Hypertension Mother   . Hyperlipidemia Mother   . Stroke Mother   . Arthritis Father   . Hyperlipidemia Father   . Hypertension Father   . Diabetes Father   . Cancer Maternal Aunt     melanoma  . Cancer Paternal Uncle     melanoma  . Arthritis Maternal Grandmother   . Alcohol abuse Maternal Grandfather   . Squamous cell carcinoma Maternal Grandfather   . Arthritis Paternal  Grandmother   . Cancer Paternal Grandmother     melanoma  . Alcohol abuse Paternal Grandfather   . Squamous cell carcinoma Maternal Aunt   . Colon cancer Paternal Uncle   . Liver cancer Paternal Uncle     Social History   Social History  . Marital Status: Married    Spouse Name: N/A  . Number of Children: N/A  . Years of Education: N/A   Social History Main Topics  . Smoking status: Former Smoker    Quit date: 09/22/2000  . Smokeless tobacco: Never Used  . Alcohol Use: No  . Drug Use: No  . Sexual Activity:    Partners: Male    Birth Control/ Protection: Surgical     Comment: BTL   Other Topics Concern  . None   Social History Narrative   Work or School: NP heaptology      Home Situation: lives with husband and 2 children      Spiritual Beliefs: none      Lifestyle: elliptical; working on diet              Current outpatient prescriptions:  .  cycloSPORINE (RESTASIS) 0.05 % ophthalmic emulsion, 1 drop 2 (two) times daily., Disp: , Rfl:  .  OVER THE COUNTER MEDICATION, Dayquil and Nyquil as needed, Disp: , Rfl:  .  HYDROcodone-homatropine (HYCODAN)  5-1.5 MG/5ML syrup, Take 5 mLs by mouth every 8 (eight) hours as needed for cough., Disp: 120 mL, Rfl: 0 .  predniSONE (DELTASONE) 20 MG tablet, Take 2 tablets (40 mg total) by mouth daily with breakfast., Disp: 8 tablet, Rfl: 0  EXAM:  Filed Vitals:   06/04/15 1058  BP: 112/80  Pulse: 120  Temp: 98.5 F (36.9 C)    Body mass index is 39.48 kg/(m^2).  GENERAL: vitals reviewed and listed above, alert, oriented, appears well hydrated and in no acute distress  HEENT: atraumatic, conjunttiva clear, no obvious abnormalities on inspection of external nose and ears, normal appearance of ear canals and TMs, clear nasal congestion, mild post oropharyngeal erythema with PND, no tonsillar edema or exudate, no sinus TTP  NECK: no obvious masses on inspection  LUNGS: clear to auscultation bilaterally, no wheezes,  rales or rhonchi, good air movement  CV: HRRR, no peripheral edema  MS: moves all extremities without noticeable abnormality  PSYCH: pleasant and cooperative, no obvious depression or anxiety  ASSESSMENT AND PLAN:  Discussed the following assessment and plan:  Cough - Plan: DG Chest 2 View  Acute upper respiratory infection  Hyperglycemia - Plan: Hemoglobin A1c  Hyperlipemia - Plan: Lipid Panel   -Patient advised to return or notify a doctor immediately if symptoms worsen or persist or new concerns arise.  Patient Instructions  Suspect a viral infection with bronchitis. Please get the chest xray today. Take the prednisone as instructed.  Get labs before you leave today and plan to follow up in about 3-4 months or sooner as needed.  We recommend the following healthy lifestyle measures: - eat a healthy whole foods diet consisting of regular small meals composed of vegetables, fruits, beans, nuts, seeds, healthy meats such as white chicken and fish and whole grains.  - avoid sweets, white starchy foods, fried foods, fast food, processed foods, sodas, red meet and other fattening foods.  - get a least 150-300 minutes of aerobic exercise per week.    -We have ordered labs or studies at this visit. It can take up to 1-2 weeks for results and processing. We will contact you with instructions IF your results are abnormal. Normal results will be released to your [facility]. If you have not heard from [place] or can not find your results in [facility] in 2 weeks please contact our office.         INSTRUCTIONS FOR UPPER RESPIRATORY INFECTION:  -plenty of rest and fluids  -nasal saline wash 2-3 times daily (use prepackaged nasal saline or bottled/distilled water if making your own)   -can use AFRIN nasal spray for drainage and nasal congestion - but do NOT use longer then 3-4 days  -can use tylenol (in no history of liver disease) or ibuprofen (if no history of kidney disease, bowel  bleeding or significant heart disease) as directed for aches and sorethroat  -in the winter time, using a humidifier at night is helpful (please follow cleaning instructions)  -if you are taking a cough medication - use only as directed, may also try a teaspoon of honey to coat the throat and throat lozenges. If given a cough medication with codeine or hydrocodone or other narcotic please be advised that this contains a strong and  potentially addicting medication. Please follow instructions carefully, take as little as possible and only use AS NEEDED for severe cough. Discuss potential side effects with your pharmacy. Please do not drive or operate machinery while taking these types of  medications. Please do not take other sedating medications, drugs or alcohol while taking this medication without discussing with your doctor.  -for sore throat, salt water gargles can help  -follow up if you have fevers, facial pain, tooth pain, difficulty breathing or are worsening or symptoms persist longer then expected  Upper Respiratory Infection, Adult An upper respiratory infection (URI) is also known as the common cold. It is often caused by a type of germ (virus). Colds are easily spread (contagious). You can pass it to others by kissing, coughing, sneezing, or drinking out of the same glass. Usually, you get better in 1 to 3  weeks.  However, the cough can last for even longer. HOME CARE   Only take medicine as told by your doctor. Follow instructions provided above.  Drink enough water and fluids to keep your pee (urine) clear or pale yellow.  Get plenty of rest.  Return to work when your temperature is < 100 for 24 hours or as told by your doctor. You may use a face mask and wash your hands to stop your cold from spreading. GET HELP RIGHT AWAY IF:   After the first few days, you feel you are getting worse.  You have questions about your medicine.  You have chills, shortness of breath, or red  spit (mucus).  You have pain in the face for more then 1-2 days, especially when you bend forward.  You have a fever, puffy (swollen) neck, pain when you swallow, or white spots in the back of your throat.  You have a bad headache, ear pain, sinus pain, or chest pain.  You have a high-pitched whistling sound when you breathe in and out (wheezing).  You cough up blood.  You have sore muscles or a stiff neck. MAKE SURE YOU:   Understand these instructions.  Will watch your condition.  Will get help right away if you are not doing well or get worse. Document Released: 01/25/2008 Document Revised: 10/31/2011 Document Reviewed: 11/13/2013 [facility] Patient Information 2015 [place], [place]. This information is not intended to replace advice given to you by your health care provider. Make sure you discuss any questions you have with your health care provider.      Colin Benton R.

## 2015-06-04 NOTE — Patient Instructions (Signed)
Suspect a viral infection with bronchitis. Please get the chest xray today. Take the prednisone as instructed.  Get labs before you leave today and plan to follow up in about 3-4 months or sooner as needed.  We recommend the following healthy lifestyle measures: - eat a healthy whole foods diet consisting of regular small meals composed of vegetables, fruits, beans, nuts, seeds, healthy meats such as white chicken and fish and whole grains.  - avoid sweets, white starchy foods, fried foods, fast food, processed foods, sodas, red meet and other fattening foods.  - get a least 150-300 minutes of aerobic exercise per week.    -We have ordered labs or studies at this visit. It can take up to 1-2 weeks for results and processing. We will contact you with instructions IF your results are abnormal. Normal results will be released to your [facility]. If you have not heard from [place] or can not find your results in [facility] in 2 weeks please contact our office.         INSTRUCTIONS FOR UPPER RESPIRATORY INFECTION:  -plenty of rest and fluids  -nasal saline wash 2-3 times daily (use prepackaged nasal saline or bottled/distilled water if making your own)   -can use AFRIN nasal spray for drainage and nasal congestion - but do NOT use longer then 3-4 days  -can use tylenol (in no history of liver disease) or ibuprofen (if no history of kidney disease, bowel bleeding or significant heart disease) as directed for aches and sorethroat  -in the winter time, using a humidifier at night is helpful (please follow cleaning instructions)  -if you are taking a cough medication - use only as directed, may also try a teaspoon of honey to coat the throat and throat lozenges. If given a cough medication with codeine or hydrocodone or other narcotic please be advised that this contains a strong and  potentially addicting medication. Please follow instructions carefully, take as little as possible and only use AS NEEDED  for severe cough. Discuss potential side effects with your pharmacy. Please do not drive or operate machinery while taking these types of medications. Please do not take other sedating medications, drugs or alcohol while taking this medication without discussing with your doctor.  -for sore throat, salt water gargles can help  -follow up if you have fevers, facial pain, tooth pain, difficulty breathing or are worsening or symptoms persist longer then expected  Upper Respiratory Infection, Adult An upper respiratory infection (URI) is also known as the common cold. It is often caused by a type of germ (virus). Colds are easily spread (contagious). You can pass it to others by kissing, coughing, sneezing, or drinking out of the same glass. Usually, you get better in 1 to 3  weeks.  However, the cough can last for even longer. HOME CARE   Only take medicine as told by your doctor. Follow instructions provided above.  Drink enough water and fluids to keep your pee (urine) clear or pale yellow.  Get plenty of rest.  Return to work when your temperature is < 100 for 24 hours or as told by your doctor. You may use a face mask and wash your hands to stop your cold from spreading. GET HELP RIGHT AWAY IF:   After the first few days, you feel you are getting worse.  You have questions about your medicine.  You have chills, shortness of breath, or red spit (mucus).  You have pain in the face for more then  1-2 days, especially when you bend forward.  You have a fever, puffy (swollen) neck, pain when you swallow, or white spots in the back of your throat.  You have a bad headache, ear pain, sinus pain, or chest pain.  You have a high-pitched whistling sound when you breathe in and out (wheezing).  You cough up blood.  You have sore muscles or a stiff neck. MAKE SURE YOU:   Understand these instructions.  Will watch your condition.  Will get help right away if you are not doing well or get  worse. Document Released: 01/25/2008 Document Revised: 10/31/2011 Document Reviewed: 11/13/2013 [facility] Patient Information 2015 [place], [place]. This information is not intended to replace advice given to you by your health care provider. Make sure you discuss any questions you have with your health care provider.

## 2015-06-05 ENCOUNTER — Encounter: Payer: Self-pay | Admitting: Family Medicine

## 2015-06-05 ENCOUNTER — Encounter: Payer: Self-pay | Admitting: *Deleted

## 2015-06-05 ENCOUNTER — Emergency Department (HOSPITAL_BASED_OUTPATIENT_CLINIC_OR_DEPARTMENT_OTHER)
Admission: EM | Admit: 2015-06-05 | Discharge: 2015-06-05 | Disposition: A | Discharge status: Home / Self Care | Payer: PRIVATE HEALTH INSURANCE | Attending: Emergency Medicine | Admitting: Emergency Medicine

## 2015-06-05 ENCOUNTER — Telehealth: Payer: Self-pay | Admitting: Family Medicine

## 2015-06-05 DIAGNOSIS — R000 Tachycardia, unspecified: Secondary | ICD-10-CM | POA: Diagnosis not present

## 2015-06-05 DIAGNOSIS — Z8739 Personal history of other diseases of the musculoskeletal system and connective tissue: Secondary | ICD-10-CM | POA: Diagnosis not present

## 2015-06-05 DIAGNOSIS — Z87448 Personal history of other diseases of urinary system: Secondary | ICD-10-CM | POA: Insufficient documentation

## 2015-06-05 DIAGNOSIS — Z8659 Personal history of other mental and behavioral disorders: Secondary | ICD-10-CM | POA: Diagnosis not present

## 2015-06-05 DIAGNOSIS — Z87891 Personal history of nicotine dependence: Secondary | ICD-10-CM | POA: Diagnosis not present

## 2015-06-05 DIAGNOSIS — I517 Cardiomegaly: Secondary | ICD-10-CM | POA: Diagnosis not present

## 2015-06-05 DIAGNOSIS — Z79899 Other long term (current) drug therapy: Secondary | ICD-10-CM | POA: Diagnosis not present

## 2015-06-05 DIAGNOSIS — Z872 Personal history of diseases of the skin and subcutaneous tissue: Secondary | ICD-10-CM | POA: Insufficient documentation

## 2015-06-05 DIAGNOSIS — J40 Bronchitis, not specified as acute or chronic: Secondary | ICD-10-CM | POA: Insufficient documentation

## 2015-06-05 DIAGNOSIS — Z8719 Personal history of other diseases of the digestive system: Secondary | ICD-10-CM | POA: Diagnosis not present

## 2015-06-05 LAB — BASIC METABOLIC PANEL
ANION GAP: 8 (ref 5–15)
BUN: 13 mg/dL (ref 6–20)
CALCIUM: 9.1 mg/dL (ref 8.9–10.3)
CO2: 24 mmol/L (ref 22–32)
CREATININE: 0.69 mg/dL (ref 0.44–1.00)
Chloride: 105 mmol/L (ref 101–111)
Glucose, Bld: 203 mg/dL — ABNORMAL HIGH (ref 65–99)
Potassium: 3.9 mmol/L (ref 3.5–5.1)
SODIUM: 137 mmol/L (ref 135–145)

## 2015-06-05 LAB — CBC
HCT: 39.9 % (ref 36.0–46.0)
HEMOGLOBIN: 13.2 g/dL (ref 12.0–15.0)
MCH: 28.6 pg (ref 26.0–34.0)
MCHC: 33.1 g/dL (ref 30.0–36.0)
MCV: 86.4 fL (ref 78.0–100.0)
PLATELETS: 278 10*3/uL (ref 150–400)
RBC: 4.62 MIL/uL (ref 3.87–5.11)
RDW: 12.9 % (ref 11.5–15.5)
WBC: 13 10*3/uL — AB (ref 4.0–10.5)

## 2015-06-05 LAB — D-DIMER, QUANTITATIVE: D-Dimer, Quant: 0.34 ug/mL-FEU (ref 0.00–0.48)

## 2015-06-05 LAB — BRAIN NATRIURETIC PEPTIDE: B NATRIURETIC PEPTIDE 5: 25.9 pg/mL (ref 0.0–100.0)

## 2015-06-05 LAB — TROPONIN I

## 2015-06-05 MED ORDER — ALBUTEROL SULFATE (2.5 MG/3ML) 0.083% IN NEBU
5.0000 mg | INHALATION_SOLUTION | Freq: Once | RESPIRATORY_TRACT | Status: AC
  Administered 2015-06-05: 5 mg via RESPIRATORY_TRACT
  Filled 2015-06-05: qty 6

## 2015-06-05 NOTE — Telephone Encounter (Signed)
I called the pt and advised her this is early to see the cardiologist as most patients wait weeks to see a cardiologist and they would order any tests that are needed.  I also advised her she could call them to see if they have any cancellations prior to this date and she agreed.

## 2015-06-05 NOTE — ED Notes (Signed)
Presents with recent DX of pneumonitis by PCP-in office [facility] x ray showed pneumonitis and cardiomegly and HR was 120s-this concerned PCP who sent her here due to new cardiomegaly and tachycardia. Pt endorses cough and palpitiations with cough. Dry cough.

## 2015-06-05 NOTE — ED Notes (Signed)
Pt alert, NAD, calm, interactive, resps e/u, speaking in clear complete sentences, no dyspnea noted. HR 96-106 NSR at this time. Dr. Tamera Punt and family at [facility].

## 2015-06-05 NOTE — Discharge Instructions (Signed)
Upper Respiratory Infection, Adult Most upper respiratory infections (URIs) are a viral infection of the air passages leading to the lungs. A URI affects the nose, throat, and upper air passages. The most common type of URI is nasopharyngitis and is typically referred to as "the common cold." URIs run their course and usually go away on their own. Most of the time, a URI does not require medical attention, but sometimes a bacterial infection in the upper airways can follow a viral infection. This is called a secondary infection. Sinus and middle ear infections are common types of secondary upper respiratory infections. Bacterial pneumonia can also complicate a URI. A URI can worsen asthma and chronic obstructive pulmonary disease (COPD). Sometimes, these complications can require emergency medical care and may be life threatening.  CAUSES Almost all URIs are caused by viruses. A virus is a type of germ and can spread from one person to another.  RISKS FACTORS You may be at risk for a URI if:   You smoke.   You have chronic heart or lung disease.  You have a weakened defense (immune) system.   You are very young or very old.   You have nasal allergies or asthma.  You work in crowded or poorly ventilated areas.  You work in health care facilities or schools. SIGNS AND SYMPTOMS  Symptoms typically develop 2-3 days after you come in contact with a cold virus. Most viral URIs last 7-10 days. However, viral URIs from the influenza virus (flu virus) can last 14-18 days and are typically more severe. Symptoms may include:   Runny or stuffy (congested) nose.   Sneezing.   Cough.   Sore throat.   Headache.   Fatigue.   Fever.   Loss of appetite.   Pain in your forehead, behind your eyes, and over your cheekbones (sinus pain).  Muscle aches.  DIAGNOSIS  Your health care provider may diagnose a URI by:  Physical exam.  Tests to check that your symptoms are not due to  another condition such as:  Strep throat.  Sinusitis.  Pneumonia.  Asthma. TREATMENT  A URI goes away on its own with time. It cannot be cured with medicines, but medicines may be prescribed or recommended to relieve symptoms. Medicines may help:  Reduce your fever.  Reduce your cough.  Relieve nasal congestion. HOME CARE INSTRUCTIONS   Take medicines only as directed by your health care provider.   Gargle warm saltwater or take cough drops to comfort your throat as directed by your health care provider.  Use a warm mist humidifier or inhale steam from a shower to increase air moisture. This may make it easier to breathe.  Drink enough fluid to keep your urine clear or pale yellow.   Eat soups and other clear broths and maintain good nutrition.   Rest as needed.   Return to work when your temperature has returned to normal or as your health care provider advises. You may need to stay home longer to avoid infecting others. You can also use a face mask and careful hand washing to prevent spread of the virus.  Increase the usage of your inhaler if you have asthma.   Do not use any tobacco products, including cigarettes, chewing tobacco, or electronic cigarettes. If you need help quitting, ask your health care provider. PREVENTION  The best way to protect yourself from getting a cold is to practice good hygiene.   Avoid oral or hand contact with people with cold  symptoms.   Wash your hands often if contact occurs.  There is no clear evidence that vitamin C, vitamin E, echinacea, or exercise reduces the chance of developing a cold. However, it is always recommended to get plenty of rest, exercise, and practice good nutrition.  SEEK MEDICAL CARE IF:   You are getting worse rather than better.   Your symptoms are not controlled by medicine.   You have chills.  You have worsening shortness of breath.  You have brown or red mucus.  You have yellow or brown nasal  discharge.  You have pain in your face, especially when you bend forward.  You have a fever.  You have swollen neck glands.  You have pain while swallowing.  You have white areas in the back of your throat. SEEK IMMEDIATE MEDICAL CARE IF:   You have severe or persistent:  Headache.  Ear pain.  Sinus pain.  Chest pain.  You have chronic lung disease and any of the following:  Wheezing.  Prolonged cough.  Coughing up blood.  A change in your usual mucus.  You have a stiff neck.  You have changes in your:  Vision.  Hearing.  Thinking.  Mood. MAKE SURE YOU:   Understand these instructions.  Will watch your condition.  Will get help right away if you are not doing well or get worse.   This information is not intended to replace advice given to you by your health care provider. Make sure you discuss any questions you have with your health care provider.   Document Released: 02/01/2001 Document Revised: 12/23/2014 Document Reviewed: 11/13/2013 Elsevier Interactive Patient Education 2016 Elsevier Inc.  Nonspecific Tachycardia Tachycardia is a faster than normal heartbeat (more than 100 beats per minute). In adults, the heart normally beats between 60 and 100 times a minute. A fast heartbeat may be a normal response to exercise or stress. It does not necessarily mean that something is wrong. However, sometimes when your heart beats too fast it may not be able to pump enough blood to the rest of your body. This can result in chest pain, shortness of breath, dizziness, and even fainting. Nonspecific tachycardia means that the specific cause or pattern of your tachycardia is unknown. CAUSES  Tachycardia may be harmless or it may be due to a more serious underlying cause. Possible causes of tachycardia include:  Exercise or exertion.  Fever.  Pain or injury.  Infection.  Loss of body fluids (dehydration).  Overactive thyroid.  Lack of red blood cells  (anemia).  Anxiety and stress.  Alcohol.  Caffeine.  Tobacco products.  Diet pills.  Illegal drugs.  Heart disease. SYMPTOMS  Rapid or irregular heartbeat (palpitations).  Suddenly feeling your heart beating (cardiac awareness).  Dizziness.  Tiredness (fatigue).  Shortness of breath.  Chest pain.  Nausea.  Fainting. DIAGNOSIS  Your caregiver will perform a physical exam and take your medical history. In some cases, a heart specialist (cardiologist) may be consulted. Your caregiver may also order:  Blood tests.  Electrocardiography. This test records the electrical activity of your heart.  A heart monitoring test. TREATMENT  Treatment will depend on the likely cause of your tachycardia. The goal is to treat the underlying cause of your tachycardia. Treatment methods may include:  Replacement of fluids or blood through an intravenous (IV) tube for moderate to severe dehydration or anemia.  New medicines or changes in your current medicines.  Diet and lifestyle changes.  Treatment for certain infections.  Stress  relief or relaxation methods. HOME CARE INSTRUCTIONS   Rest.  Drink enough fluids to keep your urine clear or pale yellow.  Do not smoke.  Avoid:  Caffeine.  Tobacco.  Alcohol.  Chocolate.  Stimulants such as over-the-counter diet pills or pills that help you stay awake.  Situations that cause anxiety or stress.  Illegal drugs such as marijuana, phencyclidine (PCP), and cocaine.  Only take medicine as directed by your caregiver.  Keep all follow-up appointments as directed by your caregiver. SEEK IMMEDIATE MEDICAL CARE IF:   You have pain in your chest, upper arms, jaw, or neck.  You become weak, dizzy, or feel faint.  You have palpitations that will not go away.  You vomit, have diarrhea, or pass blood in your stool.  Your skin is cool, pale, and wet.  You have a fever that will not go away with rest, fluids, and  medicine. MAKE SURE YOU:   Understand these instructions.  Will watch your condition.  Will get help right away if you are not doing well or get worse.   This information is not intended to replace advice given to you by your health care provider. Make sure you discuss any questions you have with your health care provider.   Document Released: 09/15/2004 Document Revised: 10/31/2011 Document Reviewed: 02/20/2015 Elsevier Interactive Patient Education [employer].

## 2015-06-05 NOTE — Telephone Encounter (Signed)
I called [facility] and spoke with Helene Kelp and she stated the pt can be seen on Monday 10/17 at 2pm.  I called the pt and informed her of this as well.

## 2015-06-05 NOTE — Telephone Encounter (Signed)
Diane Scott, I would prefer for her to be seen sooner if possible. Can you please call [facility] to let them know pt is worried about waiting that long and see if they can see her sooner or at least put her on a wait list if they have a cancellation. Then, please notify pt and also let her know that if she is feeling worse, have difficulty breathing, swelling, palpitations or any other concerns in the interim to be seen here, in [facility] or the emergency room. Please let her know I am away from the office until Wednesday.

## 2015-06-05 NOTE — Telephone Encounter (Signed)
Called to check on pt. She reports she feels fine and is glad appt with cardiology is Monday. However, on further disccusion she admitted she has had some intermittent palpitations. Reports she checked her bp and pulse at work and they were ok but pulse is 106. She has been sick with upper resp symptoms and a bad cough for 3 weeks and had new cardiomegaly on CXR and was a little tachy at her sick visit. We discussed potential etiologies and I opted since she is having palpitations and tachy, while this could be due to the prednisone or anxiety, that she get a prelim eval at [facility] or ED.

## 2015-06-05 NOTE — Telephone Encounter (Signed)
Pt said her appt with the cardiologist is 06/10/15 at [place] and is asking if there is any additional testing or is there a need for her to be seen sooner. Would like a call back .[name]

## 2015-06-05 NOTE — ED Provider Notes (Signed)
CSN: [id number]   Arrival date & time 06/05/15 2025  History  By signing my name below, I, Diane Scott, attest that this documentation has been prepared under the direction and in the presence of Malvin Johns, MD. Electronically Signed: Altamease Scott, ED Scribe. 06/05/2015. 9:15 PM.  Chief Complaint  Patient presents with  . Tachycardia    HPI The history is provided by the patient. No language interpreter was used.   Diane Scott is a 39 y.o. female who presents to the Emergency Department complaining of tachycardia with onset yesterday. States that she has had an URI for 3 weeks. She saw her PCP yesterday where she was noted to be tachycardic and had a CXR that showed possible pneumonia and new cardiomegaly. Associated symptoms include palpitations (mostly during coughing spells, dry cough, postnasal drip, SOB with coughing spells and sometimes with exertion, and chest pain. Pt denies fever and LE swelling or pain. No cardiac history.  No history of DVT/PE. She has scheduled cardiology f/u on 06/08/15. Her primary care physician called to check on her tonight and due to the palpitations, she was sent to the ED for further evaluation.  Past Medical History  Diagnosis Date  . Dysplasia of skin   . Depression   . Frequent headaches   . Migraines     numbness on side of face with them  . Cystocele   . Arthritis   . Migraine headache with aura 2012  . GERD (gastroesophageal reflux disease)     Past Surgical History  Procedure Laterality Date  . Cholecystectomy  2008  . Tubal ligation    . Uterine ablation  2/13  . Orif finger fracture    . Endometrial biopsy      neg per patient  . Colonoscopy    . Robotic assisted total hysterectomy N/A 01/12/2015    Procedure: ROBOTIC ASSISTED TOTAL HYSTERECTOMY;  Surgeon: Megan Salon, MD;  Location: [facility];  Service: Gynecology;  Laterality: N/A;  . Bilateral salpingectomy Bilateral 01/12/2015    Procedure: BILATERAL SALPINGECTOMY;   Surgeon: Megan Salon, MD;  Location: [facility];  Service: Gynecology;  Laterality: Bilateral;  . Cystoscopy N/A 01/12/2015    Procedure: CYSTOSCOPY;  Surgeon: Megan Salon, MD;  Location: [facility];  Service: Gynecology;  Laterality: N/A;  . Leg skin lesion  biopsy / excision Left 01/20/2015    [facility]-moderate dysplasia per patient    Family History  Problem Relation Age of Onset  . Arthritis Mother   . Hypertension Mother   . Hyperlipidemia Mother   . Stroke Mother   . Arthritis Father   . Hyperlipidemia Father   . Hypertension Father   . Diabetes Father   . Melanoma Maternal Aunt   . Melanoma Paternal Uncle   . Arthritis Maternal Grandmother   . Alcohol abuse Maternal Grandfather   . Squamous cell carcinoma Maternal Grandfather   . Arthritis Paternal Grandmother   . Melanoma Paternal Grandmother   . Alcohol abuse Paternal Grandfather   . Squamous cell carcinoma Maternal Aunt   . Colon cancer Paternal Uncle   . Liver cancer Paternal Uncle     Social History  Substance Use Topics  . Smoking status: Former Smoker    Quit date: 09/22/2000  . Smokeless tobacco: Never Used  . Alcohol Use: No     Review of Systems  Constitutional: Negative for fever, chills, diaphoresis and fatigue.  HENT: Positive for congestion, postnasal drip and rhinorrhea. Negative for sneezing.  Eyes: Negative.   Respiratory: Positive for cough and shortness of breath. Negative for chest tightness.   Cardiovascular: Positive for palpitations. Negative for chest pain and leg swelling.  Gastrointestinal: Negative for nausea, vomiting, abdominal pain, diarrhea and blood in stool.  Genitourinary: Negative for frequency, hematuria, flank pain and difficulty urinating.  Musculoskeletal: Negative for back pain and arthralgias.  Skin: Negative for rash.  Neurological: Negative for dizziness, speech difficulty, weakness, numbness and headaches.   Home Medications   Prior to Admission medications    Medication Sig Start Date End Date Taking? Authorizing Provider  cycloSPORINE (RESTASIS) 0.05 % ophthalmic emulsion 1 drop 2 (two) times daily.    Historical Provider, MD  doxycycline (VIBRA-TABS) 100 MG tablet Take 1 tablet (100 mg total) by mouth 2 (two) times daily. 06/04/15   Lucretia Kern, DO  HYDROcodone-homatropine (HYCODAN) 5-1.5 MG/5ML syrup Take 5 mLs by mouth every 8 (eight) hours as needed for cough. 06/04/15   Lucretia Kern, DO  OVER THE COUNTER MEDICATION Dayquil and Nyquil as needed    Historical Provider, MD  predniSONE (DELTASONE) 20 MG tablet Take 2 tablets (40 mg total) by mouth daily with breakfast. 06/04/15   Lucretia Kern, DO    Allergies  Review of patient's allergies indicates no known allergies.  Triage Vitals: BP 138/88 mmHg  Pulse 116  Temp(Src) 98.1 F (36.7 C) (Oral)  Resp 20  Wt 247 lb 9 oz (112.294 kg)  SpO2 98%  LMP 12/24/2014  Physical Exam  Constitutional: She is oriented to person, place, and time. She appears well-developed and well-nourished.  Patient's oxygen saturations are fluctuating between 90 and 97% on room air.  HENT:  Head: Normocephalic and atraumatic.  Eyes: Pupils are equal, round, and reactive to light.  Neck: Normal range of motion. Neck supple.  Cardiovascular: Normal rate, regular rhythm and normal heart sounds.   Pulmonary/Chest: Effort normal and breath sounds normal. No respiratory distress. She has no wheezes. She has no rales. She exhibits no tenderness.  Abdominal: Soft. Bowel sounds are normal. There is no tenderness. There is no rebound and no guarding.  Musculoskeletal: Normal range of motion. She exhibits no edema.  Trace nonpitting edema, no calf tenderness  Lymphadenopathy:    She has no cervical adenopathy.  Neurological: She is alert and oriented to person, place, and time.  Skin: Skin is warm and dry. No rash noted.  Psychiatric: She has a normal mood and affect.    ED Course  Procedures   DIAGNOSTIC  STUDIES: Oxygen Saturation is 98% on RA, normal by my interpretation.    COORDINATION OF CARE: 9:04 PM Discussed treatment plan which includes lab work, EKG, a breathing treatment with pt at bedside and pt agreed to plan.  Labs Reviewed  BASIC METABOLIC PANEL - Abnormal; Notable for the following:    Glucose, Bld 203 (*)    All other components within normal limits  CBC - Abnormal; Notable for the following:    WBC 13.0 (*)    All other components within normal limits  TROPONIN I  BRAIN NATRIURETIC PEPTIDE  D-DIMER, QUANTITATIVE (NOT AT [facility])    Imaging Review Dg Chest 2 View  06/04/2015  CLINICAL DATA:  Cough. EXAM: CHEST  2 VIEW COMPARISON:  None. FINDINGS: Mediastinum and hilar structures normal. Cardiomegaly. No pulmonary venous congestion. Mild bilateral pulmonary interstitial prominence. These changes could be chronic however active interstitial process including pneumonitis cannot be excluded. No pleural effusion or pneumothorax. Surgical clips upper abdomen .  IMPRESSION: 1. Mild bilateral pulmonary interstitial prominence. These changes could be chronic however an active interstitial process including mild pneumonitis cannot be excluded . 2. Cardiomegaly.  No pulmonary venous congestion. Electronically Signed   By: Marcello Moores  Register   On: 06/04/2015 12:03    I personally reviewed and evaluated these images and lab results as a part of my medical decision-making.   EKG Interpretation  Date/Time:  Friday June 05 2015 20:41:10 EDT Ventricular Rate:  102 PR Interval:  128 QRS Duration: 78 QT Interval:  324 QTC Calculation: 422 R Axis:   1 Text Interpretation:  Sinus tachycardia Possible Left atrial enlargement Borderline ECG No old tracing to compare Confirmed by Rhett Najera  MD, Griffey Nicasio ([id number]) on 06/05/2015 9:24:49 PM    MDM   Final diagnoses:  Bronchitis  Cardiomegaly  Tachycardia   Patient presents after a history of bronchitis-like symptoms. She had a chest x-ray  yesterday which showed interstitial prominence bilaterally with some cardiomegaly present. She was started on antibiotics and cough medicine. She was also started on prednisone. She feels like the cough is actually a little bit better today. I don't see any evidence of arrhythmias. There is no suggestions of pulmonary embolus. There is no evidence of consolidative pneumonia. She has no persistent hypoxia. Her heart reaction to come down into the 80s however it did jump back up after nebulizer treatment in the ED. She did not feel that she got much benefit from the nebulizer treatments I won't continue beta agonist treatment. She has no murmur present which would be concerning for endocarditis. She has no fevers. She has no signs of heart failure. She has no evidence of ischemia. She was discharged home in good condition. She has an appointment to follow-up with cardiology on Monday which is 3 days from now. She does know that if she has worsening symptoms she needs to come back to the ED.  I personally performed the services described in this documentation, which was scribed in my presence.  The recorded information has been reviewed and considered.    Malvin Johns, MD 06/05/15 2232

## 2015-06-05 NOTE — Telephone Encounter (Signed)
Pt has been scheduled for 06/10/2015@8 :00 [place] - [facility]  With  [place] . This was the soonest that they could get her in . Pt has been informed of this appt . Pt stated she thought she could get her in sooner  Want to know if this is ok please advise

## 2015-06-05 NOTE — Addendum Note (Signed)
Addended by: Lucretia Kern on: 06/05/2015 05:35 PM   Modules accepted: Orders

## 2015-06-08 ENCOUNTER — Ambulatory Visit (INDEPENDENT_AMBULATORY_CARE_PROVIDER_SITE_OTHER): Payer: PRIVATE HEALTH INSURANCE | Admitting: Nurse Practitioner

## 2015-06-08 ENCOUNTER — Other Ambulatory Visit: Payer: Self-pay

## 2015-06-08 ENCOUNTER — Ambulatory Visit (HOSPITAL_COMMUNITY): Payer: PRIVATE HEALTH INSURANCE | Attending: Cardiovascular Disease

## 2015-06-08 ENCOUNTER — Encounter: Payer: Self-pay | Admitting: Nurse Practitioner

## 2015-06-08 VITALS — BP 110/80 | HR 76 | Ht 66.0 in | Wt 245.4 lb

## 2015-06-08 DIAGNOSIS — J40 Bronchitis, not specified as acute or chronic: Secondary | ICD-10-CM

## 2015-06-08 DIAGNOSIS — I517 Cardiomegaly: Secondary | ICD-10-CM | POA: Insufficient documentation

## 2015-06-08 DIAGNOSIS — R000 Tachycardia, unspecified: Secondary | ICD-10-CM | POA: Diagnosis not present

## 2015-06-08 DIAGNOSIS — R0600 Dyspnea, unspecified: Secondary | ICD-10-CM | POA: Diagnosis present

## 2015-06-08 DIAGNOSIS — Z87891 Personal history of nicotine dependence: Secondary | ICD-10-CM | POA: Diagnosis not present

## 2015-06-08 NOTE — Telephone Encounter (Signed)
Completed-see prior note. 

## 2015-06-08 NOTE — Patient Instructions (Addendum)
We will be checking the following labs today - NONE   Medication Instructions:    Continue with your current medicines.     Testing/Procedures To Be Arranged:  Echocardiogram  Follow-Up:   PRN    Other Special Instructions:   N/A  Call the [place] office at [phone] if you have any questions, problems or concerns.

## 2015-06-08 NOTE — Progress Notes (Signed)
CARDIOLOGY OFFICE NOTE  Date:  06/08/2015    Carlis Stable Date of Birth: 08-06-1976 Medical Record #[id number]  PCP:  Lucretia Kern., DO  Cardiologist:  Allred (DOD)    Chief Complaint  Patient presents with  . Cough    New patient visit - seen for Dr. Rayann Heman (DOD)  . Tachycardia    History of Present Illness: Diane Scott is a 39 y.o. female who presents today for a new patient visit. Seen for Dr. Rayann Heman (DOD). She has no prior cardiac history. She is borderline diabetic. She is obese. She is a NP doing hepatology. She was a previous smoker - stopped in 2002 after an approximate 10 year tobacco history. No alcohol. No drug use reported. No diet pills.   Most recently in the ER with tachycardia, bronchitis and had CXR showing cardiomegaly. This was not mentioned on CXR from May nor was comparison made.   Comes in today. Here alone. She notes that she has basically been doing fine up until 3 weeks ago. She came down with a URI. She was not improving so she saw her PCP last week - sent for CXR - this was concerning for possible pneumonia and showed cardiomegaly as well. She had had a CXR in May - no comparison was made. With her URI she has been short of breath, had a cough but no swelling, no chest pain. She was referred on to the ER last Thursday - negative BNP, negative troponin and d dimer. Treated with albuterol and steroids. She was then referred here. She does continue to have some shortness of breath. Cough is loosening up. No swelling. No PND, orthopnea noted. She has about a week of antibiotic left and has just finished her steroids.    Past Medical History  Diagnosis Date  . Dysplasia of skin   . Depression   . Frequent headaches   . Migraines     numbness on side of face with them  . Cystocele   . Arthritis   . Migraine headache with aura 2012  . GERD (gastroesophageal reflux disease)     Past Surgical History  Procedure Laterality Date  . Cholecystectomy   2008  . Tubal ligation    . Uterine ablation  2/13  . Orif finger fracture    . Endometrial biopsy      neg per patient  . Colonoscopy    . Robotic assisted total hysterectomy N/A 01/12/2015    Procedure: ROBOTIC ASSISTED TOTAL HYSTERECTOMY;  Surgeon: Megan Salon, MD;  Location: [facility];  Service: Gynecology;  Laterality: N/A;  . Bilateral salpingectomy Bilateral 01/12/2015    Procedure: BILATERAL SALPINGECTOMY;  Surgeon: Megan Salon, MD;  Location: [facility];  Service: Gynecology;  Laterality: Bilateral;  . Cystoscopy N/A 01/12/2015    Procedure: CYSTOSCOPY;  Surgeon: Megan Salon, MD;  Location: [facility];  Service: Gynecology;  Laterality: N/A;  . Leg skin lesion  biopsy / excision Left 01/20/2015    [facility]-moderate dysplasia per patient     Medications: Current Outpatient Prescriptions  Medication Sig Dispense Refill  . doxycycline (VIBRA-TABS) 100 MG tablet Take 1 tablet (100 mg total) by mouth 2 (two) times daily. 20 tablet 0  . HYDROcodone-homatropine (HYCODAN) 5-1.5 MG/5ML syrup Take 5 mLs by mouth every 8 (eight) hours as needed for cough. 120 mL 0  . cycloSPORINE (RESTASIS) 0.05 % ophthalmic emulsion 1 drop 2 (two) times daily.     No current  facility-administered medications for this visit.    Allergies: No Known Allergies  Social History: The patient  reports that she quit smoking about 14 years ago. She has never used smokeless tobacco. She reports that she does not drink alcohol or use illicit drugs.   Family History: The patient's family history includes Alcohol abuse in her maternal grandfather and paternal grandfather; Arthritis in her father, maternal grandmother, mother, and paternal grandmother; Colon cancer in her paternal uncle; Diabetes in her father; Hyperlipidemia in her father and mother; Hypertension in her father and mother; Liver cancer in her paternal uncle; Melanoma in her maternal aunt, paternal grandmother, and paternal uncle; Squamous cell  carcinoma in her maternal aunt and maternal grandfather; Stroke in her mother.   Review of Systems: Please see the history of present illness.   Otherwise, the review of systems is positive for none.   All other systems are reviewed and negative.   Physical Exam: VS:  BP 110/80 mmHg  Pulse 76  Ht 5\' 6"  (1.676 m)  Wt 245 lb 6.4 oz (111.313 kg)  BMI 39.63 kg/m2  SpO2 98%  LMP 12/24/2014 .  BMI Body mass index is 39.63 kg/(m^2).  Wt Readings from Last 3 Encounters:  06/08/15 245 lb 6.4 oz (111.313 kg)  06/05/15 247 lb 9 oz (112.294 kg)  06/04/15 244 lb 8 oz (110.904 kg)    General: Pleasant. Obese white female who is in no acute distress. Has somewhat of a barky type cough.  HEENT: Normal. Neck: Supple, no JVD, carotid bruits, or masses noted.  Cardiac: Regular rate and rhythm. No murmurs, rubs, or gallops. No edema.  Respiratory:  Lungs are clear to auscultation bilaterally with normal work of breathing.  GI: Soft and nontender.  MS: No deformity or atrophy. Gait and ROM intact. Skin: Warm and dry. Color is normal.  Neuro:  Strength and sensation are intact and no gross focal deficits noted.  Psych: Alert, appropriate and with normal affect.   LABORATORY DATA:  EKG:  EKG is not ordered today. EKG from her ER visit was reviewed. This tracing showed sinus tachycardia.   Lab Results  Component Value Date   WBC 13.0* 06/05/2015   HGB 13.2 06/05/2015   HCT 39.9 06/05/2015   PLT 278 06/05/2015   GLUCOSE 203* 06/05/2015   CHOL 192 01/30/2015   TRIG 348.0* 01/30/2015   HDL 36.50* 01/30/2015   LDLDIRECT 105.0 01/30/2015   ALT 30 01/14/2015   AST 25 01/14/2015   NA 137 06/05/2015   K 3.9 06/05/2015   CL 105 06/05/2015   CREATININE 0.69 06/05/2015   BUN 13 06/05/2015   CO2 24 06/05/2015   HGBA1C 5.7 01/30/2015    BNP (last 3 results)  Recent Labs  06/05/15 2045  BNP 25.9    ProBNP (last 3 results) No results for input(s): PROBNP in the last 8760 hours.   Other  Studies Reviewed Today:  ADDENDUM REPORT: 06/08/2015 14:34 ADDENDUM: Comparison made to prior study of 01/04/2015. Mild interstitial prominence noted is stable and thus most likely chronic. Active pneumonitis cannot be entirely excluded . Electronically Signed  By: Marcello Moores Register  On: 06/08/2015 14:34  Assessment/Plan: 1. Cardiomegaly - BNP normal. Will check echocardiogram to assess her EF and make sure she has no structural issues.  If normal, would recommend weight loss, regular exercise and risk factor modification. Discussed with Dr. Rayann Heman (DOD) who is in agreement with this plan. Further disposition to follow.   2. URI/pneumonia/pneumonitis - just finished her  steroids. Has one week left of antibiotics. Her white count was elevated on labs from last week.   3. Obesity - understands the need for weight loss/regular exercise, etc.   Current medicines are reviewed with the patient today.  The patient does not have concerns regarding medicines other than what has been noted above.  The following changes have been made:  See above.  Labs/ tests ordered today include:    Orders Placed This Encounter  Procedures  . ECHOCARDIOGRAM COMPLETE     Disposition:   Preliminary echo with normal LV function. Final reading pending. I feel like she is ok. This is not CHF. She needs to work on CV risk factor modification with diet/exercise/weight loss. Maintain good BP control. Will see back as needed.   Patient is agreeable to this plan and will call if any problems develop in the interim.   Signed: Burtis Junes, RN, ANP-C 06/08/2015 3:36 PM  [facility] [address] [place], [place]  [address] Phone: [phone] Fax: [phone]

## 2015-06-09 ENCOUNTER — Encounter: Payer: Self-pay | Admitting: Nurse Practitioner

## 2015-06-09 NOTE — Telephone Encounter (Signed)
AEX appointment scheduled for 6/17/kn

## 2015-06-10 ENCOUNTER — Ambulatory Visit: Payer: PRIVATE HEALTH INSURANCE | Admitting: Cardiology

## 2015-06-17 ENCOUNTER — Ambulatory Visit: Payer: PRIVATE HEALTH INSURANCE | Admitting: Gastroenterology

## 2015-07-09 ENCOUNTER — Ambulatory Visit (INDEPENDENT_AMBULATORY_CARE_PROVIDER_SITE_OTHER): Payer: PRIVATE HEALTH INSURANCE | Admitting: Family Medicine

## 2015-07-09 ENCOUNTER — Encounter: Payer: Self-pay | Admitting: Family Medicine

## 2015-07-09 VITALS — BP 100/78 | HR 100 | Temp 99.6°F | Ht 66.0 in | Wt 241.3 lb

## 2015-07-09 DIAGNOSIS — J988 Other specified respiratory disorders: Secondary | ICD-10-CM | POA: Diagnosis not present

## 2015-07-09 MED ORDER — ALBUTEROL SULFATE HFA 108 (90 BASE) MCG/ACT IN AERS: 2.0000 | INHALATION_SPRAY | Freq: Four times a day (QID) | RESPIRATORY_TRACT | Status: DC | PRN

## 2015-07-09 MED ORDER — AZITHROMYCIN 250 MG PO TABS: ORAL_TABLET | ORAL | Status: DC

## 2015-07-09 MED ORDER — BENZONATATE 100 MG PO CAPS: 100.0000 mg | ORAL_CAPSULE | Freq: Three times a day (TID) | ORAL | Status: DC | PRN

## 2015-07-09 NOTE — Progress Notes (Signed)
Pre visit review using our clinic review tool, if applicable. No additional management support is needed unless otherwise documented below in the visit note. 

## 2015-07-09 NOTE — Progress Notes (Signed)
HPI:  Cough/fever: -started:yesterday -symptoms:nasal congestion, sore throat, cough, body aches, sob, headache, sinus pain, sneezing, fever 103 last night -denies:,NVD, tooth pain -has tried: nyquil -sick contacts/travel/risks: denies flu exposure, tick exposure or or Ebola risks - everyone at home has been sick, daughter with bullous myringitis  ROS: See pertinent positives and negatives per HPI.  Past Medical History  Diagnosis Date  . Dysplasia of skin   . Depression   . Frequent headaches   . Migraines     numbness on side of face with them  . Cystocele   . Arthritis   . Migraine headache with aura 2012  . GERD (gastroesophageal reflux disease)     Past Surgical History  Procedure Laterality Date  . Cholecystectomy  2008  . Tubal ligation    . Uterine ablation  2/13  . Orif finger fracture    . Endometrial biopsy      neg per patient  . Colonoscopy    . Robotic assisted total hysterectomy N/A 01/12/2015    Procedure: ROBOTIC ASSISTED TOTAL HYSTERECTOMY;  Surgeon: Megan Salon, MD;  Location: [facility];  Service: Gynecology;  Laterality: N/A;  . Bilateral salpingectomy Bilateral 01/12/2015    Procedure: BILATERAL SALPINGECTOMY;  Surgeon: Megan Salon, MD;  Location: [facility];  Service: Gynecology;  Laterality: Bilateral;  . Cystoscopy N/A 01/12/2015    Procedure: CYSTOSCOPY;  Surgeon: Megan Salon, MD;  Location: [facility];  Service: Gynecology;  Laterality: N/A;  . Leg skin lesion  biopsy / excision Left 01/20/2015    [facility]-moderate dysplasia per patient    Family History  Problem Relation Age of Onset  . Arthritis Mother   . Hypertension Mother   . Hyperlipidemia Mother   . Stroke Mother   . Arthritis Father   . Hyperlipidemia Father   . Hypertension Father   . Diabetes Father   . Melanoma Maternal Aunt   . Melanoma Paternal Uncle   . Arthritis Maternal Grandmother   . Alcohol abuse Maternal Grandfather   . Squamous cell carcinoma Maternal  Grandfather   . Arthritis Paternal Grandmother   . Melanoma Paternal Grandmother   . Alcohol abuse Paternal Grandfather   . Squamous cell carcinoma Maternal Aunt   . Colon cancer Paternal Uncle   . Liver cancer Paternal Uncle     Social History   Social History  . Marital Status: Married    Spouse Name: N/A  . Number of Children: N/A  . Years of Education: N/A   Social History Main Topics  . Smoking status: Former Smoker    Quit date: 09/22/2000  . Smokeless tobacco: Never Used  . Alcohol Use: No  . Drug Use: No  . Sexual Activity:    Partners: Male    Birth Control/ Protection: Surgical     Comment: BTL   Other Topics Concern  . None   Social History Narrative   Work or School: NP heaptology      Home Situation: lives with husband and 2 children      Spiritual Beliefs: none      Lifestyle: elliptical; working on diet              Current outpatient prescriptions:  .  Pseudoephedrine-APAP-DM (DAYQUIL PO), Take by mouth as needed., Disp: , Rfl:  .  albuterol (PROVENTIL HFA;VENTOLIN HFA) 108 (90 BASE) MCG/ACT inhaler, Inhale 2 puffs into the lungs every 6 (six) hours as needed., Disp: 1 Inhaler, Rfl: 0 .  azithromycin (ZITHROMAX)  250 MG tablet, 2 tabs on day 1 and then 1 tab daily for 4 days, Disp: 6 tablet, Rfl: 0 .  benzonatate (TESSALON PERLES) 100 MG capsule, Take 1 capsule (100 mg total) by mouth 3 (three) times daily as needed for cough., Disp: 20 capsule, Rfl: 0  EXAM:  Filed Vitals:   07/09/15 1606  BP: 100/78  Pulse: 100  Temp: 99.6 F (37.6 C)    Body mass index is 38.97 kg/(m^2).  GENERAL: vitals reviewed and listed above, alert, oriented, appears well hydrated and in no acute distress  HEENT: atraumatic, conjunttiva clear, no obvious abnormalities on inspection of external nose and ears, normal appearance of ear canals and TMs, clear nasal congestion, mild post oropharyngeal erythema with PND, no tonsillar edema or exudate, no sinus  TTP  NECK: no obvious masses on inspection  LUNGS: clear to auscultation bilaterally, no wheezes, rales or rhonchi, good air movement  CV: HRRR, no peripheral edema  MS: moves all extremities without noticeable abnormality  PSYCH: pleasant and cooperative, no obvious depression or anxiety  ASSESSMENT AND PLAN:  Discussed the following assessment and plan:  Respiratory infection  We discussed potential etiologies, with flu, flu-like illness vs resp infection being most likely. Her lungs are clear on exam and she has no signs of resp distress. She had her flu shot. Flu test neg. We discussed chance of false neg and tamiflu risks and benefits. It is late in the evening and she declined cxr, opted for empiric abx for possible resp inf. We discussed treatment side effects, likely course, antibiotic misuse, transmission, and signs of developing a serious illness. -of course, we advised to return or notify a doctor immediately if symptoms worsen or persist or new concerns arise.    There are no Patient Instructions on file for this visit.   Colin Benton R.

## 2015-07-15 ENCOUNTER — Encounter: Payer: Self-pay | Admitting: Obstetrics & Gynecology

## 2015-07-20 ENCOUNTER — Telehealth: Payer: Self-pay | Admitting: Emergency Medicine

## 2015-07-20 NOTE — Telephone Encounter (Signed)
Can you call pt and get her scheduled with Dr. Quincy Simmonds please? Has a cystocele and desires repair.

## 2015-07-20 NOTE — Telephone Encounter (Signed)
Chief Complaint  Patient presents with  . Appointment    Patient sent mychart message     -------------------------------------------------------------------------------- Hi!!  Hope you are well.  I'll have one of my triage nurses call and get you scheduled with my partner--Dr. Quincy Simmonds.  She does all of these repairs and is really good at it.  Hope you had a nice Thanksgiving with your family.  [name]  ----- Message -----    From: Carlis Stable    Sent: 07/15/2015  3:48 PM EST      To: Lyman Speller, MD Subject: Non-Urgent Medical Question  Dr. Sabra Heck,  I have been having significant issues due to my cystocele.  Could I make an appointment to see you to consider repair or is there another surgeon that you could recommend?  I have previously had pelvic floor therapy and have been doing my exercises religiously with no result.  This is now greatly impacting my daily life and after much thought I think it's time I do something about it.   Thank you in advance.  Diane Scott

## 2015-07-20 NOTE — Telephone Encounter (Signed)
Called patient and scheduled consult with Dr. Quincy Simmonds 07/23/15 at 1500. Patient is voiding well. Feels cystocele has worsened since 04/2015.  Routing to provider for final review. Patient agreeable to disposition. Will close encounter.

## 2015-07-20 NOTE — Telephone Encounter (Signed)
Call to patient. Feels that cystocele is worsening and feels she is not emptying bladder completely.  Consult with Dr. Quincy Simmonds scheduled for 07/23/15 at 1500.

## 2015-07-23 ENCOUNTER — Encounter: Payer: Self-pay | Admitting: Obstetrics and Gynecology

## 2015-07-23 ENCOUNTER — Ambulatory Visit (INDEPENDENT_AMBULATORY_CARE_PROVIDER_SITE_OTHER): Payer: No Typology Code available for payment source | Admitting: Obstetrics and Gynecology

## 2015-07-23 VITALS — BP 118/76 | HR 80 | Ht 65.0 in | Wt 244.6 lb

## 2015-07-23 DIAGNOSIS — N8110 Cystocele, unspecified: Secondary | ICD-10-CM

## 2015-07-23 DIAGNOSIS — N816 Rectocele: Secondary | ICD-10-CM

## 2015-07-23 DIAGNOSIS — IMO0002 Reserved for concepts with insufficient information to code with codable children: Secondary | ICD-10-CM

## 2015-07-23 DIAGNOSIS — N393 Stress incontinence (female) (male): Secondary | ICD-10-CM | POA: Diagnosis not present

## 2015-07-23 NOTE — Progress Notes (Signed)
Patient ID: Diane Scott, female   DOB: [birth year], 39 y.o.   MRN: [id number] GYNECOLOGY  VISIT   HPI: 39 y.o.   Married  Caucasian  female   G2P2002 with Patient's last menstrual period was 12/24/2014.   Status post robotic total laparoscopic hysterectomy with bilateral salpingectomy.  Here for stress urinary incontinence.    Has a cystocele for many years.  Had urodynamic testing and PT about 5 years ago for urgency.  Tried a pessary which was uncomfortable.   Leaking with coughing even if she has just emptied her bladder, so concerned she may not be emptying well.  Seems to be progressive since having bronchitis.  Was treated with a Z pack.  Wakes up and pad is wet, so thinks she may be coughing at night.   No fecal incontinence or constipation.   No hx of renal stones.  Has had 2 - 3 UTIs.  No hx pyelonephritis.  No dysuria or hematuria.   Some leakage of urine after childbearing.  Two vaginal deliveries. Had fourth degree laceration with first pregnancy.  Daughter 6 pounds 8 ounces. Second child weighed in 7 pounds 11 ounces.   Sex is difficult depending on the position.  Feels an obstruction.  Can feel a bulge.   Patient is physician specializing in hepatology.    GYNECOLOGIC HISTORY: Patient's last menstrual period was 12/24/2014. Contraception:Tubal/Hysterectomy Menopausal hormone therapy: none Last mammogram: None Last pap smear: 10-23-14 Neg:Neg HR HPV        OB History    Gravida Para Term Preterm AB TAB SAB Ectopic Multiple Living   2 2 2       2          Patient Active Problem List   Diagnosis Date Noted  . Fever postop 01/15/2015  . Stress incontinence 11/20/2014  . OA (osteoarthritis) of knee 12/26/2013  . Urgency of micturation 12/23/2010  . FOM (frequency of micturition) 12/23/2010  . Cystocele, lateral 12/23/2010  . Headache, migraine 11/11/2010  . Family history of diabetes mellitus 10/05/2009  . Atypical nevus 10/05/2009  . Clinical  depression 10/05/2009  . Carpal tunnel syndrome 10/05/2009  . Tubular adenoma 05/12/2009  . Depression, postpartum 05/12/2009  . Acid reflux 05/12/2009  . Biliary calculi 05/12/2009    Past Medical History  Diagnosis Date  . Dysplasia of skin   . Depression   . Frequent headaches   . Migraines     numbness on side of face with them  . Cystocele   . Arthritis   . Migraine headache with aura 2012  . GERD (gastroesophageal reflux disease)     Past Surgical History  Procedure Laterality Date  . Cholecystectomy  2008  . Tubal ligation    . Uterine ablation  2/13  . Orif finger fracture    . Endometrial biopsy      neg per patient  . Colonoscopy    . Robotic assisted total hysterectomy N/A 01/12/2015    Procedure: ROBOTIC ASSISTED TOTAL HYSTERECTOMY;  Surgeon: Megan Salon, MD;  Location: [facility];  Service: Gynecology;  Laterality: N/A;  . Bilateral salpingectomy Bilateral 01/12/2015    Procedure: BILATERAL SALPINGECTOMY;  Surgeon: Megan Salon, MD;  Location: [facility];  Service: Gynecology;  Laterality: Bilateral;  . Cystoscopy N/A 01/12/2015    Procedure: CYSTOSCOPY;  Surgeon: Megan Salon, MD;  Location: [facility];  Service: Gynecology;  Laterality: N/A;  . Leg skin lesion  biopsy / excision Left 01/20/2015    [facility]-moderate dysplasia per patient    Current Outpatient Prescriptions  Medication Sig Dispense Refill  . albuterol (PROVENTIL HFA;VENTOLIN HFA) 108 (90 BASE) MCG/ACT inhaler Inhale 2 puffs into the lungs every 6 (six) hours as needed. 1 Inhaler 0  . benzonatate (TESSALON PERLES) 100 MG capsule Take 1 capsule (100 mg total) by mouth 3 (three) times daily as needed for cough. 20 capsule 0   No current facility-administered medications for this visit.     ALLERGIES: Review of patient's allergies indicates no known allergies.  Family History  Problem Relation Age of Onset  . Arthritis Mother   . Hypertension Mother   . Hyperlipidemia Mother   . Stroke  Mother   . Arthritis Father   . Hyperlipidemia Father   . Hypertension Father   . Diabetes Father   . Melanoma Maternal Aunt   . Melanoma Paternal Uncle   . Arthritis Maternal Grandmother   . Alcohol abuse Maternal Grandfather   . Squamous cell carcinoma Maternal Grandfather   . Arthritis Paternal Grandmother   . Melanoma Paternal Grandmother   . Alcohol abuse Paternal Grandfather   . Squamous cell carcinoma Maternal Aunt   . Colon cancer Paternal Uncle   . Liver cancer Paternal Uncle     Social History   Social History  . Marital Status: Married    Spouse Name: N/A  . Number of Children: N/A  . Years of Education: N/A   Occupational History  . Not on file.   Social History Main Topics  . Smoking status: Former Smoker    Quit date: 09/22/2000  . Smokeless tobacco: Never Used  . Alcohol Use: No  . Drug Use: No  . Sexual Activity:    Partners: Male    Birth Control/ Protection: Surgical     Comment: BTL/Hyst   Other Topics Concern  . 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  PHYSICAL EXAMINATION:    BP 118/76 mmHg  Pulse 80  Ht 5\' 5"  (1.651 m)  Wt 244 lb 9.6 oz (110.95 kg)  BMI 40.70 kg/m2  LMP 12/24/2014    General appearance: alert, cooperative and appears stated age Abdomen: incisions well healed, soft, non-tender; no masses,  no organomegaly No abnormal inguinal nodes palpated   Pelvic: External genitalia:  no lesions              Urethra:  normal appearing urethra with no masses, tenderness or lesions.  Urethrocele.              Bartholins and Skenes: normal                 Vagina: normal appearing vagina with normal color and discharge, no lesions.  Second degree cystocele and almost second degree rectocele.  Good apical support and  good vaginal length.               Cervix: absent               Bimanual Exam:  Uterus:  uterus absent              Adnexa: no mass, fullness, tenderness  Rectovaginal: Yes.  .  Confirms.              Anus:  normal sphincter tone, no lesions  Chaperone was present for exam.  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  Genuine stress incontinence.   PLAN   I have had a comprehensive discussion with the patient regarding prolapse and urinary incontinence.  Our discussion focused on surgical repair of prolapse and incontinence. We discussed surgery with a vaginal approach and performing an anterior and posterior colporrhaphy using native tissue repair or augmentation with bovine or porcine graft and a TVT midurethral sling and cystoscopy.  At this time, I am favoring a native tissue repair for the anterior and posterior colporrhaphy.   We discussed benefits and risks of surgery which include but are not limited to bleeding, infection, damage to surrounding organs, ureteral damage, vaginal pain with intercourse, permanent mesh use which may cause erosion and exposure in the vagina, urethra, bladder or ureters, dyspareunia, slower voiding and urinary retention, possible need for prolonged catheterization and/or self catheterization, de novo overactive bladder symptoms, reoperation, recurrence of prolapse and incontinence.  I have discussed surgical expectations regarding the procedures and success rates, outcomes, and recovery.     Patient would like to proceed forward with urodynamic testing.  She is interested in surgery.   __25____ minutes face to face time of which over 50% was spent in counseling.    An After Visit Summary was printed and given to the patient.

## 2015-07-24 ENCOUNTER — Encounter (HOSPITAL_COMMUNITY): Payer: Self-pay | Admitting: Emergency Medicine

## 2015-07-24 ENCOUNTER — Emergency Department (HOSPITAL_COMMUNITY): Payer: No Typology Code available for payment source

## 2015-07-24 ENCOUNTER — Emergency Department (HOSPITAL_COMMUNITY)
Admission: EM | Admit: 2015-07-24 | Discharge: 2015-07-24 | Disposition: A | Discharge status: Home / Self Care | Payer: No Typology Code available for payment source | Attending: Emergency Medicine | Admitting: Emergency Medicine

## 2015-07-24 DIAGNOSIS — M549 Dorsalgia, unspecified: Secondary | ICD-10-CM

## 2015-07-24 DIAGNOSIS — Z872 Personal history of diseases of the skin and subcutaneous tissue: Secondary | ICD-10-CM | POA: Diagnosis not present

## 2015-07-24 DIAGNOSIS — S3991XA Unspecified injury of abdomen, initial encounter: Secondary | ICD-10-CM | POA: Insufficient documentation

## 2015-07-24 DIAGNOSIS — Z8739 Personal history of other diseases of the musculoskeletal system and connective tissue: Secondary | ICD-10-CM | POA: Diagnosis not present

## 2015-07-24 DIAGNOSIS — Z87891 Personal history of nicotine dependence: Secondary | ICD-10-CM | POA: Insufficient documentation

## 2015-07-24 DIAGNOSIS — M25512 Pain in left shoulder: Secondary | ICD-10-CM

## 2015-07-24 DIAGNOSIS — Z8659 Personal history of other mental and behavioral disorders: Secondary | ICD-10-CM | POA: Diagnosis not present

## 2015-07-24 DIAGNOSIS — S29002A Unspecified injury of muscle and tendon of back wall of thorax, initial encounter: Secondary | ICD-10-CM | POA: Diagnosis not present

## 2015-07-24 DIAGNOSIS — Y9389 Activity, other specified: Secondary | ICD-10-CM | POA: Insufficient documentation

## 2015-07-24 DIAGNOSIS — Z79899 Other long term (current) drug therapy: Secondary | ICD-10-CM | POA: Diagnosis not present

## 2015-07-24 DIAGNOSIS — Y92410 Unspecified street and highway as the place of occurrence of the external cause: Secondary | ICD-10-CM | POA: Diagnosis not present

## 2015-07-24 DIAGNOSIS — Z8679 Personal history of other diseases of the circulatory system: Secondary | ICD-10-CM | POA: Diagnosis not present

## 2015-07-24 DIAGNOSIS — Z8719 Personal history of other diseases of the digestive system: Secondary | ICD-10-CM | POA: Insufficient documentation

## 2015-07-24 DIAGNOSIS — S29001A Unspecified injury of muscle and tendon of front wall of thorax, initial encounter: Secondary | ICD-10-CM | POA: Insufficient documentation

## 2015-07-24 DIAGNOSIS — R0781 Pleurodynia: Secondary | ICD-10-CM

## 2015-07-24 DIAGNOSIS — Z87448 Personal history of other diseases of urinary system: Secondary | ICD-10-CM | POA: Diagnosis not present

## 2015-07-24 DIAGNOSIS — Y998 Other external cause status: Secondary | ICD-10-CM | POA: Insufficient documentation

## 2015-07-24 DIAGNOSIS — S4992XA Unspecified injury of left shoulder and upper arm, initial encounter: Secondary | ICD-10-CM | POA: Insufficient documentation

## 2015-07-24 DIAGNOSIS — S0993XA Unspecified injury of face, initial encounter: Secondary | ICD-10-CM | POA: Insufficient documentation

## 2015-07-24 MED ORDER — IBUPROFEN 600 MG PO TABS: 600.0000 mg | ORAL_TABLET | Freq: Four times a day (QID) | ORAL | Status: DC | PRN

## 2015-07-24 MED ORDER — IBUPROFEN 800 MG PO TABS
800.0000 mg | ORAL_TABLET | Freq: Once | ORAL | Status: AC
  Administered 2015-07-24: 800 mg via ORAL
  Filled 2015-07-24: qty 1

## 2015-07-24 MED ORDER — METHOCARBAMOL 500 MG PO TABS
500.0000 mg | ORAL_TABLET | Freq: Once | ORAL | Status: AC
  Administered 2015-07-24: 500 mg via ORAL
  Filled 2015-07-24: qty 1

## 2015-07-24 MED ORDER — METHOCARBAMOL 500 MG PO TABS: 500.0000 mg | ORAL_TABLET | Freq: Two times a day (BID) | ORAL | Status: DC
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
## 2015-07-27 ENCOUNTER — Telehealth: Payer: Self-pay | Admitting: Obstetrics and Gynecology

## 2015-07-27 NOTE — Telephone Encounter (Signed)
Attempt to return call. VM states all circuits busy and disconnects. Also routing to referral coordinator for benefit information.

## 2015-07-27 NOTE — Telephone Encounter (Signed)
Patient is ready to schedule urodynamics. Last seen 07/23/15.

## 2015-07-28 ENCOUNTER — Encounter: Payer: Self-pay | Admitting: Obstetrics and Gynecology

## 2015-07-28 NOTE — Telephone Encounter (Signed)
Responded to patient via mychart.  Open telephone encounter from 07/27/15 in place.

## 2015-07-29 NOTE — Telephone Encounter (Signed)
I anticipate an anterior and posterior colporrhaphy with TVT and cystoscopy.  Time needed 2.5 hours. Dr. Sabra Heck assistant - her patient.  Cc- Arlyn Leak Fast

## 2015-07-29 NOTE — Telephone Encounter (Signed)
Return call to patient. Surgery date options discussed. Patient prefers to proceed on 09-22-15.  Surgery instruction sheet reviewed and printed copy will be provided at appointment on 08-05-15 for urodynamics testing.  Case request sent to central scheduling.

## 2015-07-29 NOTE — Telephone Encounter (Signed)
Call to patient. She would like to plan for surgery at the end of January and states she prefers last week of January.  She is ready to proceed with Urodynamics testing this month. Agrees to 08/05/15 at 0930. Instructions given. Scheduled for pre-urodynamics urine testing for 07/31/15.   Routing to Lamont Snowball, RN to discuss dates, patient ready to proceed with final date planning at this time.  Will need post urodynamics consult scheduled.  Update to Dr. Quincy Simmonds.

## 2015-07-29 NOTE — Telephone Encounter (Signed)
Call to patient.  Encounter is closed.

## 2015-07-30 NOTE — Telephone Encounter (Signed)
Surgery is scheduled as discussed.   Routing to provider for final review.Will close encounter.

## 2015-07-31 ENCOUNTER — Ambulatory Visit (INDEPENDENT_AMBULATORY_CARE_PROVIDER_SITE_OTHER): Payer: No Typology Code available for payment source

## 2015-07-31 VITALS — BP 124/82 | HR 86 | Temp 98.6°F | Resp 18 | Ht 65.0 in | Wt 246.0 lb

## 2015-07-31 DIAGNOSIS — Z01812 Encounter for preprocedural laboratory examination: Secondary | ICD-10-CM

## 2015-07-31 LAB — POCT URINALYSIS DIP (MANUAL ENTRY)
Bilirubin, UA: NEGATIVE
Blood, UA: NEGATIVE
Glucose, UA: NEGATIVE
Ketones, POC UA: NEGATIVE
Leukocytes, UA: NEGATIVE
Nitrite, UA: NEGATIVE
Protein Ur, POC: NEGATIVE

## 2015-07-31 NOTE — Progress Notes (Signed)
Patient here for Pre Operative Urinalysis UA Test Negative  Patient's last menstrual period was 12/24/2014. Contraception:Tubal/Hysterectomy Menopausal hormone therapy: none Last mammogram: None Last pap smear: 10-23-14 Neg:Neg HR HPV  Procedures on 09/22/2015 :ANTERIOR (CYSTOCELE) AND POSTERIOR REPAIR (RECTOCELE) TRANSVAGINAL TAPE (TVT) PROCEDURE CYSTOSCOPY

## 2015-08-05 ENCOUNTER — Ambulatory Visit (INDEPENDENT_AMBULATORY_CARE_PROVIDER_SITE_OTHER): Payer: No Typology Code available for payment source | Admitting: Obstetrics and Gynecology

## 2015-08-05 DIAGNOSIS — N393 Stress incontinence (female) (male): Secondary | ICD-10-CM

## 2015-08-05 NOTE — Patient Instructions (Signed)
.   You may have a mild bladder and rectal discomfort for a few hours after the test. . You may experience some frequent urination and slight burning the first few times you urinate after the test. Rarely, the urine may be blood tinged. These are both due to catheter placements and resolve quickly.  . You should call our office immediately if you have signs of infection, which may include bladder pain, urinary urgency, fever, or burning during urination. .  We do encourage you to drink plenty of water after completion of the test today.  We will see you for your follow up with Dr. Quincy Simmonds.

## 2015-08-05 NOTE — Progress Notes (Signed)
Bita A Kerschner is a 39 y.o. female Who presents today for urodynamics testing, ordered by Dr. Quincy Simmonds.   Allergies and medications reviewed.  Denies complaints today. No urinary complaints.   Urine Micro exam: negative for WBC's or RBC's, okay to proceed per Dr. Quincy Simmonds.  Patient reports urinary leakage with coughing, sneezing, exercise. Recent worsening of symptoms due to to recent illness and URI.   Urodynamics testing initiated. Lumax Bladder Catheter #10 [place] and lumax Abdominal Catheter #10 [place].   Post void residual 10 ml.   Urethral catheter placed without issue. Rectal catheter placed without issue.   Urodynamics testing completed. Please see scanned Patient summary report in Epic. Procedure completed and patient tolerated well without complaints. Patient scheduled for follow up office visit with Dr. Quincy Simmonds to discuss results. Patient agreeable.   Patient given post procedure instructions:  You may have a mild bladder and rectal discomfort for a few hours after the test. You may experience some frequent urination and slight burning the first few times you urinate after the test. Rarely, the urine may be blood tinged. These are both due to catheter placements and resolve quickly. You should call our office immediately if you have signs of infection, which may include bladder pain, urinary urgency, fever, or burning during urination. We do encourage you to drink plenty of water after the test.

## 2015-08-06 ENCOUNTER — Encounter: Payer: Self-pay | Admitting: Obstetrics and Gynecology

## 2015-08-06 NOTE — Progress Notes (Signed)
Encounter reviewed by Dr. Aundria Rud.  Multichannel urodynamic testing Uroflow - void 28 cc and PVR 10 cc.  Essentially continuous flow.  CMG - S1 78 cc, S2 180 cc, S3 244 cc.  Max capacity 280.8 cc. VLPP - 117 cm H20. UPP - 46 cm H20.  Pressure flow - Pdet max - minus 20 cm H20.  Voided 320 cc.  Straining pattern.   Evidence of stress incontinence, type I.

## 2015-08-07 DIAGNOSIS — Z0289 Encounter for other administrative examinations: Secondary | ICD-10-CM

## 2015-08-13 ENCOUNTER — Telehealth: Payer: Self-pay | Admitting: *Deleted

## 2015-08-13 NOTE — Telephone Encounter (Signed)
I left a detailed message for the pt to return my call if she wanted to reschedule an appt for the lab tests below.

## 2015-08-13 NOTE — Telephone Encounter (Signed)
-----   Message from Lucretia Kern, DO sent at 08/10/2015 12:22 PM EST ----- Please see if she would like to set up lab appt to check lipids/hgba1c labs. Looks like order has expired. Thanks. ----- Message -----    From: SYSTEM    Sent: 08/10/2015  12:05 AM      To: Lucretia Kern, DO

## 2015-08-19 ENCOUNTER — Telehealth: Payer: Self-pay | Admitting: *Deleted

## 2015-08-19 DIAGNOSIS — Z8349 Family history of other endocrine, nutritional and metabolic diseases: Secondary | ICD-10-CM

## 2015-08-19 DIAGNOSIS — Z1329 Encounter for screening for other suspected endocrine disorder: Secondary | ICD-10-CM

## 2015-08-19 DIAGNOSIS — E785 Hyperlipidemia, unspecified: Secondary | ICD-10-CM

## 2015-08-19 DIAGNOSIS — R739 Hyperglycemia, unspecified: Secondary | ICD-10-CM

## 2015-08-19 NOTE — Telephone Encounter (Signed)
Discussed. Had uncle with hemochromatosis. She is not having any symptoms. No 1st degree relative with this. She wants to screen with fasting ferritin and trans fer sat after discussion risks/benefitis/guidlines and then consider genetic testing only if abnormal. She will call to set up fasting lab appointment.

## 2015-08-19 NOTE — Telephone Encounter (Signed)
Patient left a message on my voicemail on 12/27 stating she received the message about lab tests that are due and she did not have these at her last visit as Dr Maudie Mercury told her to wait since she was sick that day.  She wanted to know if Dr Maudie Mercury can also order the HSE genotype test that they discussed at her last visit due to her family history of hemachromatosis?  Please call pt at [phone].

## 2015-08-27 ENCOUNTER — Other Ambulatory Visit (INDEPENDENT_AMBULATORY_CARE_PROVIDER_SITE_OTHER): Payer: PRIVATE HEALTH INSURANCE

## 2015-08-27 ENCOUNTER — Encounter: Payer: Self-pay | Admitting: Obstetrics and Gynecology

## 2015-08-27 ENCOUNTER — Ambulatory Visit (INDEPENDENT_AMBULATORY_CARE_PROVIDER_SITE_OTHER): Payer: No Typology Code available for payment source | Admitting: Obstetrics and Gynecology

## 2015-08-27 VITALS — BP 110/70 | HR 80 | Resp 16 | Ht 65.0 in | Wt 243.0 lb

## 2015-08-27 DIAGNOSIS — Z8349 Family history of other endocrine, nutritional and metabolic diseases: Secondary | ICD-10-CM

## 2015-08-27 DIAGNOSIS — Z1329 Encounter for screening for other suspected endocrine disorder: Secondary | ICD-10-CM

## 2015-08-27 DIAGNOSIS — IMO0002 Reserved for concepts with insufficient information to code with codable children: Secondary | ICD-10-CM

## 2015-08-27 DIAGNOSIS — E785 Hyperlipidemia, unspecified: Secondary | ICD-10-CM

## 2015-08-27 DIAGNOSIS — N8110 Cystocele, unspecified: Secondary | ICD-10-CM

## 2015-08-27 DIAGNOSIS — N816 Rectocele: Secondary | ICD-10-CM | POA: Diagnosis not present

## 2015-08-27 DIAGNOSIS — R739 Hyperglycemia, unspecified: Secondary | ICD-10-CM | POA: Diagnosis not present

## 2015-08-27 DIAGNOSIS — N393 Stress incontinence (female) (male): Secondary | ICD-10-CM

## 2015-08-27 LAB — LDL CHOLESTEROL, DIRECT: LDL DIRECT: 109 mg/dL

## 2015-08-27 LAB — LIPID PANEL
CHOLESTEROL: 182 mg/dL (ref 0–200)
HDL: 37 mg/dL — ABNORMAL LOW (ref >39.00)
NONHDL: 145.16
TRIGLYCERIDES: 207 mg/dL — AB (ref 0.0–149.0)
Total CHOL/HDL Ratio: 5
VLDL: 41.4 mg/dL — ABNORMAL HIGH (ref 0.0–40.0)

## 2015-08-27 LAB — FERRITIN: Ferritin: 52.1 ng/mL (ref 10.0–291.0)

## 2015-08-27 LAB — HEMOGLOBIN A1C: Hgb A1c MFr Bld: 5.7 % (ref 4.6–6.5)

## 2015-08-27 LAB — TSH: TSH: 2.57 u[IU]/mL (ref 0.35–4.50)

## 2015-08-27 NOTE — Progress Notes (Signed)
GYNECOLOGY  VISIT   HPI: 40 y.o.   Married  Caucasian  female   G2P2002 with Patient's last menstrual period was 12/24/2014. Here for Pre op for 09/22/15 ANTERIOR (CYSTOCELE) AND POSTERIOR REPAIR (RECTOCELE), total time 2.5 hours TRANSVAGINAL TAPE (TVT) PROCEDURE, CYSTOSCOPY. Would like surgery for prolapse and urinary incontinence with cough, sneeze, or exercise. Leaks urine even after just voiding. Would like a graft of bovine dermis placed if needed along anterior and posterior vaginal walls.  Used Anticholinergics in the past.  Stopped this about 5 years ago.  Has done pelvic PT. Tried a pessary which was uncomfortable.  Urodynamics 08/06/15: Multichannel urodynamic testing Uroflow - void 28 cc and PVR 10 cc. Essentially continuous flow.  CMG - S1 78 cc, S2 180 cc, S3 244 cc. Max capacity 280.8 cc. VLPP - 117 cm H20. UPP - 46 cm H20.  Pressure flow - Pdet max - minus 20 cm H20. Voided 320 cc. Straining pattern.   Evidence of stress incontinence, type I.     GYNECOLOGIC HISTORY: Patient's last menstrual period was 12/24/2014. Contraception: Hysterectomy  Menopausal hormone therapy: None Last mammogram: None Last pap smear:  33/16 Neg. HR HPV:neg        OB History    Gravida Para Term Preterm AB TAB SAB Ectopic Multiple Living   2 2 2       2          Patient Active Problem List   Diagnosis Date Noted  . Fever postop 01/15/2015  . Stress incontinence 11/20/2014  . OA (osteoarthritis) of knee 12/26/2013  . Urgency of micturation 12/23/2010  . FOM (frequency of micturition) 12/23/2010  . Cystocele, lateral 12/23/2010  . Headache, migraine 11/11/2010  . Family history of diabetes mellitus 10/05/2009  . Atypical nevus 10/05/2009  . Clinical depression 10/05/2009  . Carpal tunnel syndrome 10/05/2009  . Tubular adenoma 05/12/2009  . Depression, postpartum 05/12/2009  . Acid reflux 05/12/2009  . Biliary calculi 05/12/2009    Past Medical History  Diagnosis Date   . Dysplasia of skin   . Depression   . Frequent headaches   . Migraines     numbness on side of face with them  . Cystocele   . Arthritis   . Migraine headache with aura 2012  . GERD (gastroesophageal reflux disease)     Past Surgical History  Procedure Laterality Date  . Cholecystectomy  2008  . Tubal ligation    . Uterine ablation  2/13  . Orif finger fracture    . Endometrial biopsy      neg per patient  . Colonoscopy    . Robotic assisted total hysterectomy N/A 01/12/2015    Procedure: ROBOTIC ASSISTED TOTAL HYSTERECTOMY;  Surgeon: Megan Salon, MD;  Location: [facility];  Service: Gynecology;  Laterality: N/A;  . Bilateral salpingectomy Bilateral 01/12/2015    Procedure: BILATERAL SALPINGECTOMY;  Surgeon: Megan Salon, MD;  Location: [facility];  Service: Gynecology;  Laterality: Bilateral;  . Cystoscopy N/A 01/12/2015    Procedure: CYSTOSCOPY;  Surgeon: Megan Salon, MD;  Location: [facility];  Service: Gynecology;  Laterality: N/A;  . Leg skin lesion  biopsy / excision Left 01/20/2015    [facility]-moderate dysplasia per patient    Current Outpatient Prescriptions  Medication Sig Dispense Refill  . ibuprofen (ADVIL,MOTRIN) 600 MG tablet Take 1 tablet (600 mg total) by mouth every 6 (six) hours as needed. 30 tablet 0  . methocarbamol (ROBAXIN) 500 MG tablet Take 1 tablet (  500 mg total) by mouth 2 (two) times daily. 20 tablet 0   No current facility-administered medications for this visit.     ALLERGIES: Review of patient's allergies indicates no known allergies.  Family History  Problem Relation Age of Onset  . Arthritis Mother   . Hypertension Mother   . Hyperlipidemia Mother   . Stroke Mother   . Arthritis Father   . Hyperlipidemia Father   . Hypertension Father   . Diabetes Father   . Melanoma Maternal Aunt   . Melanoma Paternal Uncle   . Arthritis Maternal Grandmother   . Alcohol abuse Maternal Grandfather   . Squamous cell carcinoma Maternal Grandfather    . Arthritis Paternal Grandmother   . Melanoma Paternal Grandmother   . Alcohol abuse Paternal Grandfather   . Squamous cell carcinoma Maternal Aunt   . Colon cancer Paternal Uncle   . Liver cancer Paternal Uncle     Social History   Social History  . Marital Status: Married    Spouse Name: N/A  . Number of Children: N/A  . Years of Education: N/A   Occupational History  . Not on file.   Social History Main Topics  . Smoking status: Former Smoker    Quit date: 09/22/2000  . Smokeless tobacco: Never Used  . Alcohol Use: No  . Drug Use: No  . Sexual Activity:    Partners: Male    Birth Control/ Protection: Surgical     Comment: BTL/Hyst   Other Topics Concern  . 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  PHYSICAL EXAMINATION:    BP 110/70 mmHg  Pulse 80  Resp 16  Ht 5\' 5"  (1.651 m)  Wt 243 lb (110.224 kg)  BMI 40.44 kg/m2  LMP 12/24/2014    General appearance: alert, cooperative and appears stated age Head: Normocephalic, without obvious abnormality, atraumatic Neck: no adenopathy, supple, symmetrical, trachea midline and thyroid normal to inspection and palpation Lungs: clear to auscultation bilaterally Heart: regular rate and rhythm Abdomen: soft, non-tender; bowel sounds normal; no masses,  no organomegaly Extremities: extremities normal, atraumatic, no cyanosis or edema Skin: Skin color, texture, turgor normal. No rashes or lesions Lymph nodes: Cervical, supraclavicular, and axillary nodes normal. No abnormal inguinal nodes palpated Neurologic: Grossly normal  Pelvic: External genitalia:  no lesions              Urethra:  normal appearing urethra with no masses, tenderness or lesions              Bartholins and Skenes: normal                  Vagina: normal appearing vagina with normal color and discharge, no lesions.  Second degree cystocele and second degree rectocele.  No palpable diverticulum.  Good apical support.              Cervix:  Absent.              Bimanual Exam:  Uterus:  uterus absent              Adnexa: normal adnexa and no mass, fullness, tenderness  Rectovaginal: Yes.  .  Confirms.              Anus:  normal sphincter tone, no lesions  Chaperone was present for exam.  ASSESSMENT  Second degree cystocele and second degree rectocele. Genuine stress incontinence.  Status post robotic TLH/bilateral salpingectomy/cystoscsopy.  PLAN  Discussion of urodynamic testing results. Proceed with anterior and posterior colporrhaphy with possible Bovine dermis graft and TVT Exact midurethral sling with cystoscopy.  Risks, benefits, and alternatives discussed with the patient who wishes to proceed.  I did review native tissue repair versus augmenting with a biologic graft - risks and benefits of each. Patient chooses the use a biologic graft if needed.  She understands that this can reduce recurrence of prolapse and can cause stiffness of the vaginal wall.  She understands that the vaginal anatomy with become tighter and smaller in diameter with surgery.  Dyspareunia is a possibility.  The permanent nature of the midurethral sling was also reviewed again along with potential complications of exposures and erosions.  The localization of the sling and the grafts was explained using a 3D model and the patient's pelvic exam.  Surgical recovery and expectations reviewed. Patient indicates understanding of the procedures and wishes to proceed.   An After Visit Summary was printed and given to the patient.  __25____ minutes face to face time of which over 50% was spent in counseling.

## 2015-09-09 NOTE — Patient Instructions (Signed)
Your procedure is scheduled on:  Tuesday, Jan. 31, 2017  Enter through the Main Entrance of [facility] at:  6:00 A.M.  Pick up the phone at the desk and dial [phone].  Call this number if you have problems the morning of surgery: [phone].  Remember: Do NOT eat food or drink after:  Midnight Monday Take these medicines the morning of surgery with a SIP OF WATER:  None  Do NOT wear jewelry (body piercing), metal hair clips/bobby pins, make-up, or nail polish. Do NOT wear lotions, powders, or perfumes.  You may wear deoderant. Do NOT shave for 48 hours prior to surgery. Do NOT bring valuables to the hospital. Contacts, dentures, or bridgework may not be worn into surgery. Leave suitcase in car.  After surgery it may be brought to your room.  For patients admitted to the hospital, checkout time is 11:00 AM the day of discharge. Have a responsible adult drive you home and stay with you for 24 hours after your procedure

## 2015-09-10 ENCOUNTER — Encounter (HOSPITAL_COMMUNITY)
Admission: RE | Admit: 2015-09-10 | Discharge: 2015-09-10 | Disposition: A | Discharge status: Home / Self Care | Payer: PRIVATE HEALTH INSURANCE | Source: Ambulatory Visit | Attending: Obstetrics and Gynecology | Admitting: Obstetrics and Gynecology

## 2015-09-10 ENCOUNTER — Encounter (HOSPITAL_COMMUNITY): Payer: Self-pay

## 2015-09-10 DIAGNOSIS — Z9889 Other specified postprocedural states: Secondary | ICD-10-CM | POA: Diagnosis not present

## 2015-09-10 DIAGNOSIS — N393 Stress incontinence (female) (male): Secondary | ICD-10-CM | POA: Diagnosis not present

## 2015-09-10 DIAGNOSIS — Z01812 Encounter for preprocedural laboratory examination: Secondary | ICD-10-CM | POA: Diagnosis present

## 2015-09-10 DIAGNOSIS — N8110 Cystocele, unspecified: Secondary | ICD-10-CM | POA: Insufficient documentation

## 2015-09-10 DIAGNOSIS — N816 Rectocele: Secondary | ICD-10-CM | POA: Diagnosis not present

## 2015-09-10 DIAGNOSIS — Z79899 Other long term (current) drug therapy: Secondary | ICD-10-CM | POA: Insufficient documentation

## 2015-09-10 LAB — CBC
HEMATOCRIT: 39.6 % (ref 36.0–46.0)
Hemoglobin: 13.3 g/dL (ref 12.0–15.0)
MCH: 29.2 pg (ref 26.0–34.0)
MCHC: 33.6 g/dL (ref 30.0–36.0)
MCV: 87 fL (ref 78.0–100.0)
Platelets: 247 10*3/uL (ref 150–400)
RBC: 4.55 MIL/uL (ref 3.87–5.11)
RDW: 13.6 % (ref 11.5–15.5)
WBC: 9.6 10*3/uL (ref 4.0–10.5)

## 2015-09-10 LAB — BASIC METABOLIC PANEL
ANION GAP: 8 (ref 5–15)
BUN: 16 mg/dL (ref 6–20)
CALCIUM: 9 mg/dL (ref 8.9–10.3)
CO2: 26 mmol/L (ref 22–32)
Chloride: 102 mmol/L (ref 101–111)
Creatinine, Ser: 0.69 mg/dL (ref 0.44–1.00)
GFR calc Af Amer: >60 mL/min (ref >60)
GLUCOSE: 157 mg/dL — AB (ref 65–99)
POTASSIUM: 4 mmol/L (ref 3.5–5.1)
SODIUM: 136 mmol/L (ref 135–145)

## 2015-09-11 ENCOUNTER — Inpatient Hospital Stay (HOSPITAL_COMMUNITY): Admission: RE | Admit: 2015-09-11 | Payer: PRIVATE HEALTH INSURANCE | Source: Ambulatory Visit

## 2015-09-17 DIAGNOSIS — Z0289 Encounter for other administrative examinations: Secondary | ICD-10-CM

## 2015-09-21 MED ORDER — METRONIDAZOLE IN NACL 5-0.79 MG/ML-% IV SOLN
500.0000 mg | INTRAVENOUS | Status: AC
  Administered 2015-09-22: 250 mg via INTRAVENOUS
  Filled 2015-09-21: qty 100

## 2015-09-21 MED ORDER — CIPROFLOXACIN IN D5W 400 MG/200ML IV SOLN
400.0000 mg | INTRAVENOUS | Status: AC
  Administered 2015-09-22: 400 mg via INTRAVENOUS
  Filled 2015-09-21: qty 200

## 2015-09-21 NOTE — H&P (Signed)
Progress Notes      Nunzio Cobbs, MD at 08/27/2015 2:59 PM     Status: Signed       Expand All Collapse All   GYNECOLOGY VISIT  HPI: 40 y.o. Married Caucasian female  G2P2002 with Patient's last menstrual period was 12/24/2014. Here for Pre op for 09/22/15 ANTERIOR (CYSTOCELE) AND POSTERIOR REPAIR (RECTOCELE), total time 2.5 hours TRANSVAGINAL TAPE (TVT) PROCEDURE, CYSTOSCOPY. Would like surgery for prolapse and urinary incontinence with cough, sneeze, or exercise. Leaks urine even after just voiding. Would like a graft of bovine dermis placed if needed along anterior and posterior vaginal walls.  Used Anticholinergics in the past.  Stopped this about 5 years ago.  Has done pelvic PT. Tried a pessary which was uncomfortable.  Urodynamics 08/06/15: Multichannel urodynamic testing Uroflow - void 28 cc and PVR 10 cc. Essentially continuous flow.  CMG - S1 78 cc, S2 180 cc, S3 244 cc. Max capacity 280.8 cc. VLPP - 117 cm H20. UPP - 46 cm H20.  Pressure flow - Pdet max - minus 20 cm H20. Voided 320 cc. Straining pattern.   Evidence of stress incontinence, type I.  GYNECOLOGIC HISTORY: Patient's last menstrual period was 12/24/2014. Contraception: Hysterectomy  Menopausal hormone therapy: None Last mammogram: None Last pap smear: 33/16 Neg. HR HPV:neg   OB History    Gravida Para Term Preterm AB TAB SAB Ectopic Multiple Living   2 2 2       2        Patient Active Problem List   Diagnosis Date Noted  . Fever postop 01/15/2015  . Stress incontinence 11/20/2014  . OA (osteoarthritis) of knee 12/26/2013  . Urgency of micturation 12/23/2010  . FOM (frequency of micturition) 12/23/2010  . Cystocele, lateral 12/23/2010  . Headache, migraine 11/11/2010  . Family history of diabetes mellitus 10/05/2009  . Atypical nevus 10/05/2009  . Clinical  depression 10/05/2009  . Carpal tunnel syndrome 10/05/2009  . Tubular adenoma 05/12/2009  . Depression, postpartum 05/12/2009  . Acid reflux 05/12/2009  . Biliary calculi 05/12/2009    Past Medical History  Diagnosis Date  . Dysplasia of skin   . Depression   . Frequent headaches   . Migraines     numbness on side of face with them  . Cystocele   . Arthritis   . Migraine headache with aura 2012  . GERD (gastroesophageal reflux disease)     Past Surgical History  Procedure Laterality Date  . Cholecystectomy  2008  . Tubal ligation    . Uterine ablation  2/13  . Orif finger fracture    . Endometrial biopsy      neg per patient  . Colonoscopy    . Robotic assisted total hysterectomy N/A 01/12/2015    Procedure: ROBOTIC ASSISTED TOTAL HYSTERECTOMY; Surgeon: Megan Salon, MD; Location: [facility]; Service: Gynecology; Laterality: N/A;  . Bilateral salpingectomy Bilateral 01/12/2015    Procedure: BILATERAL SALPINGECTOMY; Surgeon: Megan Salon, MD; Location: [facility]; Service: Gynecology; Laterality: Bilateral;  . Cystoscopy N/A 01/12/2015    Procedure: CYSTOSCOPY; Surgeon: Megan Salon, MD; Location: [facility]; Service: Gynecology; Laterality: N/A;  . Leg skin lesion biopsy / excision Left 01/20/2015    [facility]-moderate dysplasia per patient    Current Outpatient Prescriptions  Medication Sig Dispense Refill  . ibuprofen (ADVIL,MOTRIN) 600 MG tablet Take 1 tablet (600 mg total) by mouth every 6 (six) hours as needed. 30 tablet 0  . methocarbamol (ROBAXIN) 500 MG  tablet Take 1 tablet (500 mg total) by mouth 2 (two) times daily. 20 tablet 0   No current facility-administered medications for this visit.     ALLERGIES: Review of patient's allergies indicates no known allergies.  Family History  Problem Relation Age of Onset   . Arthritis Mother   . Hypertension Mother   . Hyperlipidemia Mother   . Stroke Mother   . Arthritis Father   . Hyperlipidemia Father   . Hypertension Father   . Diabetes Father   . Melanoma Maternal Aunt   . Melanoma Paternal Uncle   . Arthritis Maternal Grandmother   . Alcohol abuse Maternal Grandfather   . Squamous cell carcinoma Maternal Grandfather   . Arthritis Paternal Grandmother   . Melanoma Paternal Grandmother   . Alcohol abuse Paternal Grandfather   . Squamous cell carcinoma Maternal Aunt   . Colon cancer Paternal Uncle   . Liver cancer Paternal Uncle     Social History   Social History  . Marital Status: Married    Spouse Name: N/A  . Number of Children: N/A  . Years of Education: N/A   Occupational History  . Not on file.   Social History Main Topics  . Smoking status: Former Smoker    Quit date: 09/22/2000  . Smokeless tobacco: Never Used  . Alcohol Use: No  . Drug Use: No  . Sexual Activity:    Partners: Male    Birth Control/ Protection: Surgical     Comment: BTL/Hyst   Other Topics Concern  . 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  PHYSICAL EXAMINATION:   BP 110/70 mmHg  Pulse 80  Resp 16  Ht 5\' 5"  (1.651 m)  Wt 243 lb (110.224 kg)  BMI 40.44 kg/m2  LMP 12/24/2014  General appearance: alert, cooperative and appears stated age Head: Normocephalic, without obvious abnormality, atraumatic Neck: no adenopathy, supple, symmetrical, trachea midline and thyroid normal to inspection and palpation Lungs: clear to auscultation bilaterally Heart: regular rate and  rhythm Abdomen: soft, non-tender; bowel sounds normal; no masses, no organomegaly Extremities: extremities normal, atraumatic, no cyanosis or edema Skin: Skin color, texture, turgor normal. No rashes or lesions Lymph nodes: Cervical, supraclavicular, and axillary nodes normal. No abnormal inguinal nodes palpated Neurologic: Grossly normal  Pelvic: External genitalia: no lesions  Urethra: normal appearing urethra with no masses, tenderness or lesions  Bartholins and Skenes: normal   Vagina: normal appearing vagina with normal color and discharge, no lesions. Second degree cystocele and second degree rectocele. No palpable diverticulum. Good apical support.  Cervix: Absent.   Bimanual Exam: Uterus: uterus absent  Adnexa: normal adnexa and no mass, fullness, tenderness  Rectovaginal: Yes. . Confirms.  Anus: normal sphincter tone, no lesions  Chaperone was present for exam.  ASSESSMENT  Second degree cystocele and second degree rectocele. Genuine stress incontinence.  Status post robotic TLH/bilateral salpingectomy/cystoscsopy.  PLAN  Discussion of urodynamic testing results. Proceed with anterior and posterior colporrhaphy with possible Bovine dermis graft and TVT Exact midurethral sling with cystoscopy.  Risks, benefits, and alternatives discussed with the patient who wishes to proceed.  I did review native tissue repair versus augmenting  with a biologic graft - risks and benefits of each. Patient chooses the use a biologic graft if needed. She understands that this can reduce recurrence of prolapse and can cause stiffness of the vaginal wall. She understands that the vaginal anatomy with become tighter and smaller in diameter with surgery. Dyspareunia is a possibility.  The permanent nature of the midurethral sling was also reviewed again along with potential  complications of exposures and erosions.  The localization of the sling and the grafts was explained using a 3D model and the patient's pelvic exam.  Surgical recovery and expectations reviewed. Patient indicates understanding of the procedures and wishes to proceed.   An After Visit Summary was printed and given to the patient.  __25____ minutes face to face time of which over 50% was spent in counseling.

## 2015-09-22 ENCOUNTER — Encounter (HOSPITAL_COMMUNITY): Payer: Self-pay | Admitting: Anesthesiology

## 2015-09-22 ENCOUNTER — Ambulatory Visit (HOSPITAL_COMMUNITY): Payer: PRIVATE HEALTH INSURANCE | Admitting: Anesthesiology

## 2015-09-22 ENCOUNTER — Observation Stay (HOSPITAL_COMMUNITY)
Admission: RE | Admit: 2015-09-22 | Discharge: 2015-09-23 | Disposition: A | Discharge status: Home / Self Care | Payer: PRIVATE HEALTH INSURANCE | Source: Ambulatory Visit | Attending: Obstetrics and Gynecology | Admitting: Obstetrics and Gynecology

## 2015-09-22 ENCOUNTER — Encounter (HOSPITAL_COMMUNITY)
Admission: RE | Disposition: A | Discharge status: Home / Self Care | Payer: Self-pay | Source: Ambulatory Visit | Attending: Obstetrics and Gynecology

## 2015-09-22 DIAGNOSIS — N393 Stress incontinence (female) (male): Secondary | ICD-10-CM | POA: Diagnosis present

## 2015-09-22 DIAGNOSIS — N816 Rectocele: Secondary | ICD-10-CM | POA: Diagnosis not present

## 2015-09-22 DIAGNOSIS — Z9049 Acquired absence of other specified parts of digestive tract: Secondary | ICD-10-CM | POA: Insufficient documentation

## 2015-09-22 DIAGNOSIS — K219 Gastro-esophageal reflux disease without esophagitis: Secondary | ICD-10-CM | POA: Diagnosis not present

## 2015-09-22 DIAGNOSIS — N8110 Cystocele, unspecified: Secondary | ICD-10-CM | POA: Diagnosis not present

## 2015-09-22 DIAGNOSIS — Z79899 Other long term (current) drug therapy: Secondary | ICD-10-CM | POA: Insufficient documentation

## 2015-09-22 DIAGNOSIS — Z6839 Body mass index (BMI) 39.0-39.9, adult: Secondary | ICD-10-CM | POA: Insufficient documentation

## 2015-09-22 DIAGNOSIS — Z9889 Other specified postprocedural states: Secondary | ICD-10-CM

## 2015-09-22 DIAGNOSIS — Z87891 Personal history of nicotine dependence: Secondary | ICD-10-CM | POA: Insufficient documentation

## 2015-09-22 HISTORY — PX: ANTERIOR AND POSTERIOR REPAIR: SHX5121

## 2015-09-22 HISTORY — PX: CYSTOSCOPY: SHX5120

## 2015-09-22 HISTORY — PX: BLADDER SUSPENSION: SHX72

## 2015-09-22 SURGERY — ANTERIOR (CYSTOCELE) AND POSTERIOR REPAIR (RECTOCELE)
Anesthesia: General | Site: Vagina

## 2015-09-22 MED ORDER — ONDANSETRON HCL 4 MG PO TABS: 4.0000 mg | ORAL_TABLET | Freq: Four times a day (QID) | ORAL | Status: DC | PRN

## 2015-09-22 MED ORDER — EPHEDRINE 5 MG/ML INJ
INTRAVENOUS | Status: AC
  Filled 2015-09-22: qty 10

## 2015-09-22 MED ORDER — KETOROLAC TROMETHAMINE 30 MG/ML IJ SOLN
INTRAMUSCULAR | Status: DC | PRN
  Administered 2015-09-22: 30 mg via INTRAVENOUS

## 2015-09-22 MED ORDER — LIDOCAINE HCL (CARDIAC) 20 MG/ML IV SOLN
INTRAVENOUS | Status: AC
Start: 2015-09-22 — End: 2015-09-22
  Filled 2015-09-22: qty 5

## 2015-09-22 MED ORDER — PROPOFOL 10 MG/ML IV BOLUS
INTRAVENOUS | Status: DC | PRN
  Administered 2015-09-22: 180 mg via INTRAVENOUS

## 2015-09-22 MED ORDER — MORPHINE SULFATE 2 MG/ML IV SOLN: INTRAVENOUS | Status: DC

## 2015-09-22 MED ORDER — HYDROMORPHONE HCL 1 MG/ML IJ SOLN
0.2500 mg | INTRAMUSCULAR | Status: DC | PRN
  Administered 2015-09-22: 0.25 mg via INTRAVENOUS

## 2015-09-22 MED ORDER — KETOROLAC TROMETHAMINE 30 MG/ML IJ SOLN
INTRAMUSCULAR | Status: AC
  Filled 2015-09-22: qty 1

## 2015-09-22 MED ORDER — LIDOCAINE-EPINEPHRINE 1 %-1:100000 IJ SOLN
INTRAMUSCULAR | Status: DC | PRN
  Administered 2015-09-22: 6 mL
  Administered 2015-09-22: 8 mL

## 2015-09-22 MED ORDER — LIDOCAINE HCL (CARDIAC) 20 MG/ML IV SOLN
INTRAVENOUS | Status: DC | PRN
  Administered 2015-09-22: 30 mg via INTRAVENOUS
  Administered 2015-09-22: 70 mg via INTRAVENOUS

## 2015-09-22 MED ORDER — SCOPOLAMINE 1 MG/3DAYS TD PT72
MEDICATED_PATCH | TRANSDERMAL | Status: DC
Start: 2015-09-22 — End: 2015-09-23
  Administered 2015-09-22: 1.5 mg via TRANSDERMAL
  Filled 2015-09-22: qty 1

## 2015-09-22 MED ORDER — ROCURONIUM BROMIDE 100 MG/10ML IV SOLN
INTRAVENOUS | Status: DC | PRN
  Administered 2015-09-22: 40 mg via INTRAVENOUS
  Administered 2015-09-22: 10 mg via INTRAVENOUS

## 2015-09-22 MED ORDER — EPHEDRINE SULFATE 50 MG/ML IJ SOLN
INTRAMUSCULAR | Status: DC | PRN
  Administered 2015-09-22 (×2): 10 mg via INTRAVENOUS

## 2015-09-22 MED ORDER — ONDANSETRON HCL 4 MG/2ML IJ SOLN: 4.0000 mg | Freq: Four times a day (QID) | INTRAMUSCULAR | Status: DC | PRN

## 2015-09-22 MED ORDER — FENTANYL CITRATE (PF) 100 MCG/2ML IJ SOLN
INTRAMUSCULAR | Status: DC | PRN
  Administered 2015-09-22 (×4): 50 ug via INTRAVENOUS
  Administered 2015-09-22 (×2): 25 ug via INTRAVENOUS

## 2015-09-22 MED ORDER — FENTANYL CITRATE (PF) 250 MCG/5ML IJ SOLN
INTRAMUSCULAR | Status: AC
  Filled 2015-09-22: qty 5

## 2015-09-22 MED ORDER — ONDANSETRON HCL 4 MG/2ML IJ SOLN
INTRAMUSCULAR | Status: AC
  Filled 2015-09-22: qty 2

## 2015-09-22 MED ORDER — OXYCODONE HCL 5 MG/5ML PO SOLN: 5.0000 mg | Freq: Once | ORAL | Status: DC | PRN

## 2015-09-22 MED ORDER — ONDANSETRON HCL 4 MG/2ML IJ SOLN
INTRAMUSCULAR | Status: DC | PRN
  Administered 2015-09-22: 4 mg via INTRAVENOUS

## 2015-09-22 MED ORDER — PHENYLEPHRINE HCL 10 MG/ML IJ SOLN
INTRAMUSCULAR | Status: DC | PRN
  Administered 2015-09-22: 120 ug via INTRAVENOUS
  Administered 2015-09-22: 40 ug via INTRAVENOUS

## 2015-09-22 MED ORDER — OXYCODONE-ACETAMINOPHEN 5-325 MG PO TABS
1.0000 | ORAL_TABLET | ORAL | Status: DC | PRN
  Administered 2015-09-22 – 2015-09-23 (×2): 1 via ORAL
  Filled 2015-09-22 (×2): qty 1

## 2015-09-22 MED ORDER — PROPOFOL 10 MG/ML IV BOLUS
INTRAVENOUS | Status: AC
  Filled 2015-09-22: qty 20

## 2015-09-22 MED ORDER — STERILE WATER FOR IRRIGATION IR SOLN
Status: DC | PRN
  Administered 2015-09-22: 1000 mL

## 2015-09-22 MED ORDER — MEPERIDINE HCL 25 MG/ML IJ SOLN: 6.2500 mg | INTRAMUSCULAR | Status: DC | PRN

## 2015-09-22 MED ORDER — MIDAZOLAM HCL 2 MG/2ML IJ SOLN
INTRAMUSCULAR | Status: DC | PRN
  Administered 2015-09-22: 1 mg via INTRAVENOUS

## 2015-09-22 MED ORDER — HYDROMORPHONE HCL 1 MG/ML IJ SOLN
INTRAMUSCULAR | Status: AC
  Filled 2015-09-22: qty 1

## 2015-09-22 MED ORDER — SCOPOLAMINE 1 MG/3DAYS TD PT72
1.0000 | MEDICATED_PATCH | Freq: Once | TRANSDERMAL | Status: DC
Start: 2015-09-22 — End: 2015-09-23
  Administered 2015-09-22: 1.5 mg via TRANSDERMAL

## 2015-09-22 MED ORDER — IBUPROFEN 600 MG PO TABS
600.0000 mg | ORAL_TABLET | Freq: Four times a day (QID) | ORAL | Status: DC | PRN
  Administered 2015-09-22 – 2015-09-23 (×3): 600 mg via ORAL
  Filled 2015-09-22 (×3): qty 1

## 2015-09-22 MED ORDER — DIPHENHYDRAMINE HCL 50 MG/ML IJ SOLN: 12.5000 mg | Freq: Four times a day (QID) | INTRAMUSCULAR | Status: DC | PRN

## 2015-09-22 MED ORDER — NEOSTIGMINE METHYLSULFATE 10 MG/10ML IV SOLN
INTRAVENOUS | Status: AC
  Filled 2015-09-22: qty 1

## 2015-09-22 MED ORDER — PHENYLEPHRINE 40 MCG/ML (10ML) SYRINGE FOR IV PUSH (FOR BLOOD PRESSURE SUPPORT)
PREFILLED_SYRINGE | INTRAVENOUS | Status: AC
  Filled 2015-09-22: qty 10

## 2015-09-22 MED ORDER — DEXAMETHASONE SODIUM PHOSPHATE 10 MG/ML IJ SOLN
INTRAMUSCULAR | Status: DC | PRN
  Administered 2015-09-22: 4 mg via INTRAVENOUS

## 2015-09-22 MED ORDER — LIDOCAINE-EPINEPHRINE 1 %-1:100000 IJ SOLN
INTRAMUSCULAR | Status: AC
  Filled 2015-09-22: qty 1

## 2015-09-22 MED ORDER — LACTATED RINGERS IV SOLN
INTRAVENOUS | Status: DC
  Administered 2015-09-22 (×2): via INTRAVENOUS

## 2015-09-22 MED ORDER — NALOXONE HCL 0.4 MG/ML IJ SOLN: 0.4000 mg | INTRAMUSCULAR | Status: DC | PRN

## 2015-09-22 MED ORDER — KETOROLAC TROMETHAMINE 30 MG/ML IJ SOLN
30.0000 mg | Freq: Four times a day (QID) | INTRAMUSCULAR | Status: DC
  Administered 2015-09-22: 30 mg via INTRAVENOUS
  Filled 2015-09-22: qty 1

## 2015-09-22 MED ORDER — ROCURONIUM BROMIDE 100 MG/10ML IV SOLN
INTRAVENOUS | Status: AC
  Filled 2015-09-22: qty 1

## 2015-09-22 MED ORDER — ESTRADIOL 0.1 MG/GM VA CREA
TOPICAL_CREAM | VAGINAL | Status: AC
  Filled 2015-09-22: qty 42.5

## 2015-09-22 MED ORDER — GLYCOPYRROLATE 0.2 MG/ML IJ SOLN
INTRAMUSCULAR | Status: DC | PRN
  Administered 2015-09-22: 0.6 mg via INTRAVENOUS

## 2015-09-22 MED ORDER — DIPHENHYDRAMINE HCL 12.5 MG/5ML PO ELIX: 12.5000 mg | ORAL_SOLUTION | Freq: Four times a day (QID) | ORAL | Status: DC | PRN

## 2015-09-22 MED ORDER — DEXAMETHASONE SODIUM PHOSPHATE 4 MG/ML IJ SOLN
INTRAMUSCULAR | Status: AC
  Filled 2015-09-22: qty 1

## 2015-09-22 MED ORDER — MENTHOL 3 MG MT LOZG: 1.0000 | LOZENGE | OROMUCOSAL | Status: DC | PRN

## 2015-09-22 MED ORDER — SODIUM CHLORIDE 0.9% FLUSH: 9.0000 mL | INTRAVENOUS | Status: DC | PRN

## 2015-09-22 MED ORDER — NEOSTIGMINE METHYLSULFATE 10 MG/10ML IV SOLN
INTRAVENOUS | Status: DC | PRN
  Administered 2015-09-22: 3 mg via INTRAVENOUS

## 2015-09-22 MED ORDER — GLYCOPYRROLATE 0.2 MG/ML IJ SOLN
INTRAMUSCULAR | Status: AC
  Filled 2015-09-22: qty 3

## 2015-09-22 MED ORDER — LACTATED RINGERS IV SOLN
INTRAVENOUS | Status: DC
  Administered 2015-09-22 – 2015-09-23 (×3): via INTRAVENOUS

## 2015-09-22 MED ORDER — OXYCODONE HCL 5 MG PO TABS: 5.0000 mg | ORAL_TABLET | Freq: Once | ORAL | Status: DC | PRN

## 2015-09-22 MED ORDER — MORPHINE SULFATE 2 MG/ML IV SOLN
INTRAVENOUS | Status: DC
  Administered 2015-09-22: 7 mg via INTRAVENOUS
  Administered 2015-09-22: 12:00:00 via INTRAVENOUS
  Administered 2015-09-22: 3 mL via INTRAVENOUS
  Administered 2015-09-22: 0.5 mL via INTRAVENOUS
  Filled 2015-09-22: qty 25

## 2015-09-22 MED ORDER — ESTRADIOL 0.1 MG/GM VA CREA
TOPICAL_CREAM | VAGINAL | Status: DC | PRN
  Administered 2015-09-22: 1 via VAGINAL

## 2015-09-22 MED ORDER — MIDAZOLAM HCL 2 MG/2ML IJ SOLN
INTRAMUSCULAR | Status: AC
  Filled 2015-09-22: qty 2

## 2015-09-22 MED ORDER — LACTATED RINGERS IV SOLN
INTRAVENOUS | Status: DC
Start: 2015-09-22 — End: 2015-09-22
  Administered 2015-09-22: 125 mL/h via INTRAVENOUS

## 2015-09-22 SURGICAL SUPPLY — 46 items
BLADE SURG 11 STRL SS (BLADE) ×4 IMPLANT
CANISTER SUCT 3000ML (MISCELLANEOUS) ×4 IMPLANT
CATH FOLEY 2WAY SLVR  5CC 18FR (CATHETERS) ×2
CATH FOLEY 2WAY SLVR 5CC 18FR (CATHETERS) ×2 IMPLANT
CLOTH BEACON ORANGE TIMEOUT ST (SAFETY) ×4 IMPLANT
CONT PATH 16OZ SNAP LID 3702 (MISCELLANEOUS) IMPLANT
DECANTER SPIKE VIAL GLASS SM (MISCELLANEOUS) IMPLANT
DEVICE CAPIO SLIM SINGLE (INSTRUMENTS) IMPLANT
GAUZE PACKING 2X5 YD STRL (GAUZE/BANDAGES/DRESSINGS) ×4 IMPLANT
GAUZE SPONGE 4X4 16PLY XRAY LF (GAUZE/BANDAGES/DRESSINGS) ×4 IMPLANT
GLOVE BIO SURGEON STRL SZ 6.5 (GLOVE) ×3 IMPLANT
GLOVE BIO SURGEONS STRL SZ 6.5 (GLOVE) ×1
GLOVE BIOGEL PI IND STRL 6.5 (GLOVE) ×2 IMPLANT
GLOVE BIOGEL PI IND STRL 7.0 (GLOVE) ×2 IMPLANT
GLOVE BIOGEL PI INDICATOR 6.5 (GLOVE) ×2
GLOVE BIOGEL PI INDICATOR 7.0 (GLOVE) ×2
GOWN STRL REUS W/TWL LRG LVL3 (GOWN DISPOSABLE) ×16 IMPLANT
LEGGING LITHOTOMY PAIR STRL (DRAPES) ×4 IMPLANT
LIQUID BAND (GAUZE/BANDAGES/DRESSINGS) ×4 IMPLANT
NEEDLE HYPO 22GX1.5 SAFETY (NEEDLE) ×4 IMPLANT
NEEDLE MAYO 6 CRC TAPER PT (NEEDLE) IMPLANT
NS IRRIG 1000ML POUR BTL (IV SOLUTION) ×4 IMPLANT
PACK VAGINAL MINOR WOMEN LF (CUSTOM PROCEDURE TRAY) IMPLANT
PACK VAGINAL WOMENS (CUSTOM PROCEDURE TRAY) ×4 IMPLANT
PAD MAGNETIC INST (MISCELLANEOUS) ×4 IMPLANT
PLUG CATH AND CAP STER (CATHETERS) ×4 IMPLANT
RETRACTOR STAY HOOK 5MM (MISCELLANEOUS) IMPLANT
SET CYSTO W/LG BORE CLAMP LF (SET/KITS/TRAYS/PACK) ×4 IMPLANT
SLING TVT EXACT (Sling) ×4 IMPLANT
SPONGE SURGIFOAM ABS GEL 12-7 (HEMOSTASIS) IMPLANT
SURGIFLO TRUKIT (HEMOSTASIS) IMPLANT
SUT CAPIO ETHIBPND (SUTURE) IMPLANT
SUT VIC AB 0 CT1 27 (SUTURE) ×10
SUT VIC AB 0 CT1 27XBRD ANBCTR (SUTURE) ×10 IMPLANT
SUT VIC AB 2-0 CT1 27 (SUTURE)
SUT VIC AB 2-0 CT1 TAPERPNT 27 (SUTURE) IMPLANT
SUT VIC AB 2-0 CT2 27 (SUTURE) IMPLANT
SUT VIC AB 2-0 SH 27 (SUTURE) ×8
SUT VIC AB 2-0 SH 27XBRD (SUTURE) ×8 IMPLANT
SUT VIC AB 2-0 UR6 27 (SUTURE) IMPLANT
TOWEL OR 17X24 6PK STRL BLUE (TOWEL DISPOSABLE) ×8 IMPLANT
TRAY FOLEY BAG SILVER LF 16FR (SET/KITS/TRAYS/PACK) ×4 IMPLANT
TRAY FOLEY CATH SILVER 14FR (SET/KITS/TRAYS/PACK) IMPLANT
TUBING NON-CON 1/4 X 20 CONN (TUBING) ×3 IMPLANT
TUBING NON-CON 1/4 X 20' CONN (TUBING) ×1
WATER STERILE IRR 1000ML POUR (IV SOLUTION) IMPLANT

## 2015-09-22 NOTE — Anesthesia Postprocedure Evaluation (Signed)
Anesthesia Post Note  Patient: Diane Scott  Procedure(s) Performed: Procedure(s) (LRB): ANTERIOR (CYSTOCELE) AND POSTERIOR REPAIR (RECTOCELE)  (N/A) TRANSVAGINAL TAPE (TVT) PROCEDURE (N/A) CYSTOSCOPY (N/A)  Patient location during evaluation: PACU Anesthesia Type: General Level of consciousness: awake and alert Pain management: pain level controlled Vital Signs Assessment: post-procedure vital signs reviewed and stable Respiratory status: spontaneous breathing, nonlabored ventilation, respiratory function stable and patient connected to nasal cannula oxygen Cardiovascular status: blood pressure returned to baseline and stable Postop Assessment: no signs of nausea or vomiting Anesthetic complications: no    Last Vitals:  Filed Vitals:   09/22/15 0953 09/22/15 1000  BP: 117/58 110/59  Pulse: 99 83  Temp: 36.9 C   Resp: 20 21    Last Pain: There were no vitals filed for this visit.               Kymber Kosar A

## 2015-09-22 NOTE — Addendum Note (Signed)
Addendum  created 09/22/15 1409 by Tobin Chad, CRNA   Modules edited: Clinical Notes   Clinical Notes:  File: [id number]

## 2015-09-22 NOTE — Transfer of Care (Signed)
Immediate Anesthesia Transfer of Care Note  Patient: Diane Scott  Procedure(s) Performed: Procedure(s): ANTERIOR (CYSTOCELE) AND POSTERIOR REPAIR (RECTOCELE)  (N/A) TRANSVAGINAL TAPE (TVT) PROCEDURE (N/A) CYSTOSCOPY (N/A)  Patient Location: PACU  Anesthesia Type:General  Level of Consciousness: awake, alert , oriented and patient cooperative  Airway & Oxygen Therapy: Patient Spontanous Breathing and Patient connected to nasal cannula oxygen  Post-op Assessment: Report given to RN and Post -op Vital signs reviewed and stable  Post vital signs: Reviewed and stable  Last Vitals:  Filed Vitals:   09/22/15 0624  BP: 134/59  Pulse: 89  Temp: 36.8 C  Resp: 20    Complications: No apparent anesthesia complications

## 2015-09-22 NOTE — Op Note (Signed)
Diane Scott, Diane Scott                 ACCOUNT NO.:  [id number]  MEDICAL RECORD NO.:  [id number]  LOCATION:  [facility]                          FACILITY:  [facility]  PHYSICIAN:  Lenard Galloway, M.D.   DATE OF BIRTH:  Dec 10, 1975  DATE OF PROCEDURE:  09/22/2015 DATE OF DISCHARGE:                              OPERATIVE REPORT   PREOPERATIVE DIAGNOSES:  Genuine stress incontinence, second-degree cystocele, second-degree rectocele.  POSTOPERATIVE DIAGNOSES:  Genuine stress incontinence, second-degree cystocele, second-degree rectocele.  PROCEDURE:  Anterior and posterior colporrhaphy with TVT EXACT midurethral sling and cystoscopy.  SURGEON:  Lenard Galloway, M.D.  ASSISTANT:  Sumner Boast, M.D.  ANESTHESIA:  General endotracheal, local with 1% lidocaine with epinephrine 1:100,000.  IV FLUIDS:  1000 mL Ringer's lactate.  EBL:  100 mL.  URINE OUTPUT:  250 mL.  COMPLICATIONS:  None.  INDICATIONS FOR THE PROCEDURE:  The patient is a 40 year old gravida 2, para 2, Caucasian female, status post robotic total laparoscopic hysterectomy with bilateral salpingectomy and cystoscopy performed on Jan 12, 2015, who presents with a cystocele and rectocele and urinary incontinence with stressful maneuvers.  The patient is requesting surgical repair.  Multichannel urodynamic testing in the office confirmed the presence of genuine stress incontinence and the patient is noted to have both a second-degree cystocele and rectocele with good apical vaginal support.  A plan is now made to proceed with an anterior and posterior colporrhaphy with a TVT EXACT midurethral sling and cystoscopy.  A possible Xenform graft will be placed with sacrospinous bilateral fixation if necessary to give apical vaginal support.  Examination under anesthesia revealed a second-degree cystocele and a second-degree rectocele.  The vaginal apex was well supported.  The vaginal mucosa was well estrogenized.  Cystoscopy after  placement of the sling documented the absence of a foreign body in the bladder or the urethra.  The bladder was visualized throughout 360 degrees including the bladder dome and trigone.  The ureters were noted to be patent bilaterally.  Final rectal exam at the termination of the procedure documented the absence of sutures or any foreign body in the rectum.  SPECIMEN:  None.  DESCRIPTION OF PROCEDURE:  The patient was identified in the preoperative hold area.  She received Ciprofloxacin and Flagyl IV for antibiotic prophylaxis.  She received TED hose and PAS stockings for DVT prophylaxis.  In the operating room, the patient was placed in the dorsal lithotomy position with Allen stirrups and she was then placed under general endotracheal anesthesia.  The lower abdomen, vagina and perineum were then sterilely prepped and she was sterilely draped.  An examination under anesthesia was performed.  A Foley catheter was placed inside the bladder and left to gravity drainage throughout and at the termination of the procedure.  A Deaver retractor was placed inside the vagina and Allis clamps were used to mark the midline of the anterior vaginal wall.  The mucosa was injected locally with 1% lidocaine with epinephrine 1:100,000.  The vaginal mucosa was then incised vertically in the midline beginning 2 cm from the vaginal apex and extending to 1 cm below the urethral meatus. The combination of sharp and blunt dissection were used  to dissect the vaginal mucosa and subvaginal tissue off the bladder.  The dissection was carried back to the level of the pubic rami.  Hemostasis during the dissection was created with monopolar cautery.  The 1-cm suprapubic incisions were created 2 cm to the right and left of the midline.  This was performed using a scalpel.  The Foley catheter was removed and an obturator guide was placed inside the urethra.  The TVT EXACT was placed in standard bottom-up  fashion. The sling guide was placed through the right endopelvic fascia through the ipsilateral retropubic space and then up through the right suprapubic incision.  The urethra was then deflected in the opposite direction using the obturator guide and again the sling with guide was brought up through the patient's left-hand side.  The Foley catheter was removed and cystoscopy was performed.  There was no evidence of a foreign body in the bladder or the urethra.  The right ureter was noted to be patent and the left ureter was not patent at this time.  The bladder was drained of all cystoscopic fluid.  The Foley catheter was replaced.  The sling was drawn up through the two suprapubic incisions at this time and a Kelly clamp was placed between the urethra and the mesh as the plastic sheaths were removed.  Excess sling material was trimmed suprapubically.  The cystocele was repaired by placing vertical mattress sutures of 0 Vicryl.  Final cystoscopy was performed, which now documented patency of the bilateral ureters.   Excess vaginal mucosa was trimmed prior to closing the anterior vaginal mucosa with a running locked suture of 2-0 Vicryl.      The posterior colporrhaphy was performed next.  Allis clamps were used to mark the perineal body and then the posterior vaginal mucosa in the midline.  The mucosa was injected locally with 1% lidocaine with epinephrine 1:100,000.  A triangular wedge of epithelium was excised from the perineal body and the posterior vaginal mucosa was incised vertically in the midline using a Metzenbaum scissors.  Sharp and blunt dissection were used to dissect the rectovaginal fascia off the vaginal mucosa bilaterally.  Hemostasis was created with monopolar cautery.  The posterior colporrhaphy was performed by placing vertical mattress sutures of 0 Vicryl, which reduced the rectocele.  Rectal examination confirmed the absence of sutures in the rectum.  The posterior  vaginal mucosa was closed after the excess vaginal mucosa was trimmed.  This was performed in a running locked fashion down to the level of the hymen. The perineum was supported by placing multiple vertical mattress sutures of 0 Vicryl.  This provided good support to the perineal body.  2-0 Vicryl suture was then brought behind the hymen and down along the perineal body in a running fashion in the superficial perineal muscles. The suture was then brought up the skin of the perineum in a subcuticular fashion as for an episiotomy repair closure.  The knot was tied at the hymen.  Final examination documented good length and normal vaginal diameter. There was good support to the anterior and posterior vaginal walls.  The suprapubic incisions were closed with Dermabond.    A gauze packing with Estrace was placed inside the vagina.  Final rectal exam confirmed the absence of a foreign body in the rectum.  The patient was awakened and extubated, and escorted to the recovery room in stable condition.  There were no complications to the procedure. All needle, instrument, and sponge counts were correct.  Lenard Galloway, M.D.     BES/MEDQ  D:  09/22/2015  T:  09/22/2015  Job:  [id number]

## 2015-09-22 NOTE — Progress Notes (Signed)
Day of Surgery Procedure(s) (LRB): ANTERIOR (CYSTOCELE) AND POSTERIOR REPAIR (RECTOCELE)  (N/A) TRANSVAGINAL TAPE (TVT) PROCEDURE (N/A) CYSTOSCOPY (N/A)  Subjective: Patient reports urethral irritation like she wants to void.  Notes pelvic pressure.  Hungry.  Not out of bed yet.  Objective: I have reviewed patient's vital signs and intake and output. T max 98.5, t now 98.2, BP 100/55, P 71, RR 18.  I/O - 1400 cc/475 cc.   General: alert and cooperative Resp: clear to auscultation bilaterally Cardio: regular rate and rhythm, S1, S2 normal, no murmur, click, rub or gallop GI: soft, non-tender; bowel sounds normal; no masses,  no organomegaly and incision: clean, dry, intact and suprapubic incisions with Dermabond. Extremities: PAS and Ted hose on.  Vaginal Bleeding: moderate on pad.   Assessment: s/p Procedure(s): ANTERIOR (CYSTOCELE) AND POSTERIOR REPAIR (RECTOCELE)  (N/A) TRANSVAGINAL TAPE (TVT) PROCEDURE (N/A) CYSTOSCOPY (N/A): stable  Plan: Advance diet Encourage ambulation Continue foley due to post op state. Will advance to po diet if taking po well and desires to try oral pain medication.   CBC and BMP in the am.  Voiding trials in the am.  Reviewed surgical findings and procedure.      Lauri Till A Quincy Simmonds 09/22/2015, 2:07 PM

## 2015-09-22 NOTE — Anesthesia Preprocedure Evaluation (Signed)
Anesthesia Evaluation  Patient identified by MRN, date of birth, ID band Patient awake    Reviewed: Allergy & Precautions, NPO status , Patient's Chart, lab work & pertinent test results  Airway Mallampati: I  TM Distance: >3 FB Neck ROM: Full    Dental  (+) Teeth Intact, Dental Advisory Given   Pulmonary former smoker,    breath sounds clear to auscultation       Cardiovascular  Rhythm:Regular Rate:Normal     Neuro/Psych    GI/Hepatic GERD  Medicated and Controlled,  Endo/Other  Morbid obesity  Renal/GU      Musculoskeletal   Abdominal   Peds  Hematology   Anesthesia Other Findings Elevated BS being followed by her PCP  Reproductive/Obstetrics                             Anesthesia Physical Anesthesia Plan  ASA: I  Anesthesia Plan: General   Post-op Pain Management:    Induction: Intravenous  Airway Management Planned: Oral ETT  Additional Equipment:   Intra-op Plan:   Post-operative Plan: Extubation in OR  Informed Consent: I have reviewed the patients History and Physical, chart, labs and discussed the procedure including the risks, benefits and alternatives for the proposed anesthesia with the patient or authorized representative who has indicated his/her understanding and acceptance.   Dental advisory given  Plan Discussed with: CRNA, Anesthesiologist and Surgeon  Anesthesia Plan Comments:         Anesthesia Quick Evaluation

## 2015-09-22 NOTE — Anesthesia Procedure Notes (Addendum)
Procedure Name: Intubation Date/Time: 09/22/2015 7:23 AM Performed by: Tobin Chad Pre-anesthesia Checklist: Patient identified, Timeout performed, Emergency Drugs available, Suction available and Patient being monitored Patient Re-evaluated:Patient Re-evaluated prior to inductionOxygen Delivery Method: Circle system utilized and Simple face mask Preoxygenation: Pre-oxygenation with 100% oxygen Intubation Type: IV induction and Inhalational induction Ventilation: Mask ventilation without difficulty Laryngoscope Size: Mac and 3 Grade View: Grade II Tube type: Oral Tube size: 7.0 mm Number of attempts: 1 Airway Equipment and Method: Stylet Dental Injury: Teeth and Oropharynx as per pre-operative assessment    Performed by: Tobin Chad Secured at: 21 cm Tube secured with: Tape

## 2015-09-22 NOTE — Brief Op Note (Signed)
09/22/2015  9:45 AM  PATIENT:  Diane Scott  40 y.o. female  PRE-OPERATIVE DIAGNOSIS:  cystocele, rectocele, SUI  POST-OPERATIVE DIAGNOSIS:  cystocele, rectocele, SUI  PROCEDURE:  Procedure(s): ANTERIOR (CYSTOCELE) AND POSTERIOR REPAIR (RECTOCELE)  (N/A) TRANSVAGINAL TAPE (TVT) PROCEDURE (N/A) CYSTOSCOPY (N/A)  SURGEON:  Surgeon(s) and Role:    * Brook E Yisroel Ramming, MD - Primary    * Salvadore Dom, MD - Assisting  PHYSICIAN ASSISTANT: NA  ASSISTANTS: Salvadore Dom, MD   ANESTHESIA:   general  EBL:  Total I/O In: 1000 [I.V.:1000] Out: 350 [Urine:250; Blood:100]  BLOOD ADMINISTERED:none  DRAINS: Urinary Catheter (Foley)   LOCAL MEDICATIONS USED:  LIDOCAINE 1% with Epinephrine 1:100,000  SPECIMEN:  No Specimen  DISPOSITION OF SPECIMEN:  N/A  COUNTS:  YES  TOURNIQUET:  * No tourniquets in log *  DICTATION: .Other Dictation: Dictation Number    PLAN OF CARE: Admit for overnight observation  PATIENT DISPOSITION:  PACU - hemodynamically stable.   Delay start of Pharmacological VTE agent (>24hrs) due to surgical blood loss or risk of bleeding: not applicable

## 2015-09-22 NOTE — Progress Notes (Signed)
Update to History and Physical  No marked change in status since office pre-op visit.   Patient examined.   OK to proceed with surgery. 

## 2015-09-22 NOTE — Anesthesia Postprocedure Evaluation (Signed)
Anesthesia Post Note  Patient: Diane Scott  Procedure(s) Performed: Procedure(s) (LRB): ANTERIOR (CYSTOCELE) AND POSTERIOR REPAIR (RECTOCELE)  (N/A) TRANSVAGINAL TAPE (TVT) PROCEDURE (N/A) CYSTOSCOPY (N/A)  Patient location during evaluation: [facility] Anesthesia Type: General Level of consciousness: awake, awake and alert, oriented and patient cooperative Pain management: pain level controlled Vital Signs Assessment: post-procedure vital signs reviewed and stable Respiratory status: spontaneous breathing and patient connected to nasal cannula oxygen Cardiovascular status: blood pressure returned to baseline and stable Postop Assessment: no headache, no backache and no signs of nausea or vomiting Anesthetic complications: no    Last Vitals:  Filed Vitals:   09/22/15 1215 09/22/15 1359  BP: 100/55 100/52  Pulse: 71 77  Temp: 36.8 C 36.7 C  Resp: 18 22    Last Pain:  Filed Vitals:   09/22/15 1407  PainSc: 3                  Taje Littler C

## 2015-09-23 ENCOUNTER — Encounter (HOSPITAL_COMMUNITY): Payer: Self-pay | Admitting: Obstetrics and Gynecology

## 2015-09-23 ENCOUNTER — Other Ambulatory Visit: Payer: Self-pay | Admitting: Obstetrics and Gynecology

## 2015-09-23 DIAGNOSIS — N393 Stress incontinence (female) (male): Secondary | ICD-10-CM | POA: Diagnosis not present

## 2015-09-23 LAB — BASIC METABOLIC PANEL
ANION GAP: 8 (ref 5–15)
BUN: 11 mg/dL (ref 6–20)
CALCIUM: 8.7 mg/dL — AB (ref 8.9–10.3)
CO2: 27 mmol/L (ref 22–32)
CREATININE: 0.76 mg/dL (ref 0.44–1.00)
Chloride: 105 mmol/L (ref 101–111)
Glucose, Bld: 122 mg/dL — ABNORMAL HIGH (ref 65–99)
Potassium: 4.2 mmol/L (ref 3.5–5.1)
SODIUM: 140 mmol/L (ref 135–145)

## 2015-09-23 LAB — CBC
HEMATOCRIT: 33 % — AB (ref 36.0–46.0)
Hemoglobin: 10.8 g/dL — ABNORMAL LOW (ref 12.0–15.0)
MCH: 28.4 pg (ref 26.0–34.0)
MCHC: 32.7 g/dL (ref 30.0–36.0)
MCV: 86.8 fL (ref 78.0–100.0)
PLATELETS: 254 10*3/uL (ref 150–400)
RBC: 3.8 MIL/uL — ABNORMAL LOW (ref 3.87–5.11)
RDW: 13.4 % (ref 11.5–15.5)
WBC: 12.7 10*3/uL — AB (ref 4.0–10.5)

## 2015-09-23 MED ORDER — CIPROFLOXACIN HCL 250 MG PO TABS: 250.0000 mg | ORAL_TABLET | Freq: Two times a day (BID) | ORAL | Status: DC

## 2015-09-23 MED ORDER — OXYCODONE-ACETAMINOPHEN 5-325 MG PO TABS: 1.0000 | ORAL_TABLET | ORAL | Status: DC | PRN

## 2015-09-23 NOTE — Progress Notes (Addendum)
Pt verbalizes understanding of d/c instructions, medications, follow up appts, when to seek medical attention and belongings policy. Pt was encouraged to check room thoroughly for her belongings. IV was d/c by NT without complications. Pt has no questions at this time. Foley catheter was placed for urinary retention, prior to pt being d/c per order, Dr. Quincy Simmonds was notified and will see pt in her office on Monday. Pt was given a copy of d/c instructions as well as "Recovering from Surgery" booklet. I reviewed care of catheter with patient, including how to use leg bag and she understands this. Pt was given prescription at time of d/c education. Pts husband is here and will be driving her home. Pt was escorted to the main entrance by NT without complications. Marry Guan

## 2015-09-23 NOTE — Progress Notes (Signed)
1 Day Post-Op Procedure(s) (LRB): ANTERIOR (CYSTOCELE) AND POSTERIOR REPAIR (RECTOCELE)  (N/A) TRANSVAGINAL TAPE (TVT) PROCEDURE (N/A) CYSTOSCOPY (N/A)  Late entry from morning rounds.  Subjective: Patient reports tolerating PO.   Foley and packing out early am. Tolerating regular diet.  Good pain control.  Objective: I have reviewed patient's vital signs, intake and output and labs. Hgb 10.8 WBC 12.7 Glucose 122  General: alert and cooperative Resp: clear to auscultation bilaterally Cardio: regular rate and rhythm, S1, S2 normal, no murmur, click, rub or gallop GI: soft, non-tender; bowel sounds normal; no masses,  no organomegaly and incision: clean, dry and intact Vaginal Bleeding: minimal  Assessment: s/p Procedure(s): ANTERIOR (CYSTOCELE) AND POSTERIOR REPAIR (RECTOCELE)  (N/A) TRANSVAGINAL TAPE (TVT) PROCEDURE (N/A) CYSTOSCOPY (N/A): progressing well  Plan: Encourage ambulation Discharge home Voiding trials prior to discharge to determine catheter status.  Discharge instructions and precautions reviewed with patient.  Percocet and Motrin for pain control. Follow up in 5 days.      Brook A Silva 09/23/2015, 9:00 PM

## 2015-09-23 NOTE — Progress Notes (Signed)
Vaginal packing removed per MD order. Moderate amount of drainage noted with few small clots less than "pea" sized. Patient tolerated well. Will continue to monitor.

## 2015-09-23 NOTE — Progress Notes (Signed)
Pt just called unit and states that she left her d/c instructions as well as her prescription in her room. She says that she doesn't need those things but that she just didn't want someone to get their hands on her prescription. She said to just shred those things which I did. Diane Scott

## 2015-09-23 NOTE — Discharge Instructions (Signed)
Urethral Vaginal Sling, Care After Refer to this sheet in the next few weeks. These instructions provide you with information on caring for yourself after your procedure. Your health care provider may also give you more specific instructions. Your treatment has been planned according to current medical practices, but problems sometimes occur. Call your health care provider if you have any problems or questions after your procedure.  WHAT TO EXPECT AFTER THE PROCEDURE  After your procedure, it is typical to have the following:  A catheter in your bladder until your bladder is able to work on its own properly. You will be instructed on how to empty the catheter bag.  Absorbable stitches in your incisions. They will slowly dissolve over 1-2 months. HOME CARE INSTRUCTIONS  Get plenty of rest.  Only take over-the-counter or prescription medicines as directed by your health care provider. Do not take aspirin because it can cause bleeding.  Do not take baths. Take showers until your health care provider tells you otherwise.  You may resume your usual diet. Eat a well-balanced diet.  Drink enough fluids to keep your urine clear or pale yellow.  Limit exercise and activities as directed by your health care provider. Do not lift anything heavier than 5 pounds (2.3 kg).  Do not douche, use tampons, or have sexual intercourse for 6 weeks after your procedure.  Follow up with your health care provider as directed. SEEK MEDICAL CARE IF:  You have a heavy or bad smelling vaginal discharge.   You have a rash.   You have pain that is not controlled with medicines.   You have lightheadedness or feel faint.  SEEK IMMEDIATE MEDICAL CARE IF:  You have a fever.   You have vaginal bleeding.   You faint.   You have shortness of breath.   You have chest, abdominal, or leg pain.   You have pain when urinating or cannot urinate.   Your catheter is still in your bladder and becomes  blocked.   You have swelling, redness, and pain in the vaginal area.    This information is not intended to replace advice given to you by your health care provider. Make sure you discuss any questions you have with your health care provider.   Document Released: 05/29/2013 Document Reviewed: 05/29/2013 Elsevier Interactive Patient Education 2016 [employer]  Anterior and Posterior Colporrhaphy, Sling Procedure, Care After Refer to this sheet in the next few weeks. These instructions provide you with information on caring for yourself after your procedure. Your health care provider may also give you more specific instructions. Your treatment has been planned according to current medical practices, but problems sometimes occur. Call your health care provider if you have any problems or questions after your procedure.  HOME CARE INSTRUCTIONS  Rest as much as possible during the first 2 weeks after the procedure.   Avoid heavy lifting (more than 10 pounds [4.5 kg]), pushing, or pulling. Limit stair climbing to once or twice a day the first week, then slowly increase this activity.   Avoid standing for prolonged periods of time.   Talk with your health care provider about when you may resume your usual physical activity.   You may resume your normal diet right away.   Drink at least 6-8 glasses of non-caffeinated beverages per day.   Eat a well-balanced diet. Daily portions of food from the meat (protein), milk, fruit, vegetable, and bread families are necessary for your health.   Your normal bowel function  should return. If you become constipated, you may:   Take a mild laxative.  Add fruit and bran to your diet.  Drink more liquids.  You may take a shower and wash your hair.   Only take over-the-counter or prescription medicines as directed by your health care provider.   Clean the incision with water. Do not use a dressing unless the incision is draining or  irritated. Check your incision daily for redness, draining, swelling, or separation of the skin.   Follow any bladder care instructions provided by your health care provider.   Keep your perineal area (the area between vagina and rectum) clean and dry. Perform perineal care after every bowel movement and each time you urinate. You may take a sitz bath or sit in a tub of clean, warm water when necessary, unless your health care provider tells you otherwise.   Do not have sexual intercourse until permitted by your health care provider.   Follow up with your health care provider as directed.  SEEK MEDICAL CARE IF:  You have shaking chills.   Your pain is not relieved with medicine or becomes worse.  You have frequent or urgent urination, or you are unable to completely empty your bladder.   You feel a burning sensation when urinating.   You see pus coming from the wounds.  SEEK IMMEDIATE MEDICAL CARE IF:  You develop a fever.  You notice redness, drainage, swelling, or separation of the skin at the incision site.  You have difficulty breathing.  You are unable to urinate. MAKE SURE YOU:   Understand these instructions.  Will watch your condition.  Will get help right away if you are not doing well or get worse.   This information is not intended to replace advice given to you by your health care provider. Make sure you discuss any questions you have with your health care provider.   Document Released: 03/22/2004 Document Revised: 04/10/2013 Document Reviewed: 12/28/2012 Elsevier Interactive Patient Education [employer].

## 2015-09-26 NOTE — Discharge Summary (Signed)
Physician Discharge Summary  Patient ID: Diane Scott MRN: [id number] DOB/AGE: 11-08-1975 40 y.o.  Admit date: 09/22/2015 Discharge date: 09/23/15 Admission Diagnoses: 1.  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 2.  Genuine stress incontinence.  Discharge Diagnoses:  1.  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 2.  Genuine stress incontinence. 3.  Status post anterior and posterior colporrhaphy with TVT Exact midurethral sling and cystoscopy.  4.  Postop urinary retention.   Active Problems:   Status post surgery   Discharged Condition: good  Hospital Course:  The patient was admitted on 09/22/15 for an anterior and posterior colporrhaphy with TVT Exact midurethral sling and cystoscopy which were performed without complication while under general anesthesia.  The patient's post op course was uneventful.  She had a morphine PCA and Toradol for pain control initially, and this was converted over to Percocet and Motrin when the patient began taking po well.  She ambulated independently and wore PAS and Ted hose for DVT prophylaxis while in bed.  Her vaginal packing and foley catheter were removed on post op day one, and she was unable to void successfully.  She therefore had her foley catheter replaced and left to gravity drainage at discharge.  The patient's vital signs remained stable and she demonstrated no signs of infection during her hospitalization.  The patient's post op day one Hgb was 10.8.   She was tolerating the this well.  She had no significant vaginal bleeding, and her incision(s) demonstrated no signs of erythema or significant drainage.  She was found to be in good condition and ready for discharge on post op day one.   Consults: None  Significant Diagnostic Studies: labs post op day one:  Hgb 12.8.  Glucose 122.  WBC 12.7.  Treatments: surgery:  anterior and posterior colporrhaphy with TVT Exact midurethral sling and  cystoscopy.  Discharge Exam: Blood pressure 95/59, pulse 93, temperature 98 F (36.7 C), temperature source Oral, resp. rate 20, height 5\' 6"  (1.676 m), weight 244 lb (110.678 kg), last menstrual period 12/24/2014, SpO2 94 %.   General: alert and cooperative Resp: clear to auscultation bilaterally Cardio: regular rate and rhythm, S1, S2 normal, no murmur, click, rub or gallop GI: soft, non-tender; bowel sounds normal; no masses, no organomegaly and incision: clean, dry and intact Vaginal Bleeding: minimal  Disposition: 01-Home or Self Care  Instructions reviewed in verbal and written form.     Medication List    STOP taking these medications        acetaminophen 325 MG tablet  Commonly known as:  TYLENOL      TAKE these medications        cycloSPORINE 0.05 % ophthalmic emulsion  Commonly known as:  RESTASIS  Place 1 drop into both eyes 2 (two) times daily.     ibuprofen 600 MG tablet  Commonly known as:  ADVIL,MOTRIN  Take 1 tablet (600 mg total) by mouth every 6 (six) hours as needed.     methocarbamol 500 MG tablet  Commonly known as:  ROBAXIN  Take 1 tablet (500 mg total) by mouth 2 (two) times daily.     oxyCODONE-acetaminophen 5-325 MG tablet  Commonly known as:  PERCOCET/ROXICET  Take 1-2 tablets by mouth every 4 (four) hours as needed for severe pain (moderate to severe pain (when tolerating fluids)).      ASK your doctor about these medications        ciprofloxacin 250 MG tablet  Commonly known as:  CIPRO  Take 1 tablet (250 mg total) by mouth 2 (two) times daily.      Ciprofloxacin will be taken as prophylaxis due to multiple catheterizations for postvoid residual checks and placement of foley catheter at time of discharge.     Follow-up Information    Follow up with Arloa Koh, MD In 5 days.   Specialty:  Obstetrics and Gynecology   Contact information:   [address] [place] [address] [phone]        Signed: Arloa Koh 09/26/2015, 8:44 AM

## 2015-09-28 ENCOUNTER — Ambulatory Visit (INDEPENDENT_AMBULATORY_CARE_PROVIDER_SITE_OTHER): Payer: No Typology Code available for payment source | Admitting: Obstetrics and Gynecology

## 2015-09-28 ENCOUNTER — Encounter: Payer: Self-pay | Admitting: Obstetrics and Gynecology

## 2015-09-28 VITALS — BP 130/86 | HR 86 | Resp 18 | Ht 65.0 in | Wt 243.0 lb

## 2015-09-28 DIAGNOSIS — R339 Retention of urine, unspecified: Secondary | ICD-10-CM | POA: Diagnosis not present

## 2015-09-28 DIAGNOSIS — Z9889 Other specified postprocedural states: Secondary | ICD-10-CM

## 2015-09-28 NOTE — Progress Notes (Signed)
GYNECOLOGY  VISIT   HPI: 40 y.o.   Married  caucasian female   G2P2002 with Patient's last menstrual period was 12/24/2014.   here for  1 week post op.   Procedure:ANTERIOR (CYSTOCELE) AND POSTERIOR REPAIR (RECTOCELE) (N/A Vagina )  TRANSVAGINAL TAPE (TVT) PROCEDURE (N/A Vagina )  CYSTOSCOPY (N/A Urethra)   Discharge to home with foley cath due to urinary retention.   On Cipro 250 mg po bid for prophylaxis.  Nauseous.  Taking Motrin only.   Scant drainage.   Having BMs.    GYNECOLOGIC HISTORY: Patient's last menstrual period was 12/24/2014. Contraception:Hysterectomy Menopausal hormone therapy: None Last mammogram: None Last pap smear: 10-23-14 Neg:Neg HR HPV        OB History    Gravida Para Term Preterm AB TAB SAB Ectopic Multiple Living   2 2 2       2          Patient Active Problem List   Diagnosis Date Noted  . Status post surgery 09/22/2015  . Fever postop 01/15/2015  . Stress incontinence 11/20/2014  . OA (osteoarthritis) of knee 12/26/2013  . Urgency of micturation 12/23/2010  . FOM (frequency of micturition) 12/23/2010  . Cystocele, lateral 12/23/2010  . Headache, migraine 11/11/2010  . Family history of diabetes mellitus 10/05/2009  . Atypical nevus 10/05/2009  . Clinical depression 10/05/2009  . Carpal tunnel syndrome 10/05/2009  . Tubular adenoma 05/12/2009  . Depression, postpartum 05/12/2009  . Acid reflux 05/12/2009  . Biliary calculi 05/12/2009    Past Medical History  Diagnosis Date  . Dysplasia of skin   . Depression   . Frequent headaches   . Migraines     numbness on side of face with them  . Cystocele   . Arthritis   . Migraine headache with aura 2012  . GERD (gastroesophageal reflux disease)     Past Surgical History  Procedure Laterality Date  . Cholecystectomy  2008  . Tubal ligation    . Uterine ablation  2/13  . Orif finger fracture    . Endometrial biopsy      neg per patient  . Colonoscopy    . Robotic assisted  total hysterectomy N/A 01/12/2015    Procedure: ROBOTIC ASSISTED TOTAL HYSTERECTOMY;  Surgeon: Megan Salon, MD;  Location: [facility];  Service: Gynecology;  Laterality: N/A;  . Bilateral salpingectomy Bilateral 01/12/2015    Procedure: BILATERAL SALPINGECTOMY;  Surgeon: Megan Salon, MD;  Location: [facility];  Service: Gynecology;  Laterality: Bilateral;  . Cystoscopy N/A 01/12/2015    Procedure: CYSTOSCOPY;  Surgeon: Megan Salon, MD;  Location: [facility];  Service: Gynecology;  Laterality: N/A;  . Leg skin lesion  biopsy / excision Left 01/20/2015    [facility]-moderate dysplasia per patient  . Anterior and posterior repair N/A 09/22/2015    Procedure: ANTERIOR (CYSTOCELE) AND POSTERIOR REPAIR (RECTOCELE) ;  Surgeon: Nunzio Cobbs, MD;  Location: [facility];  Service: Gynecology;  Laterality: N/A;  . Bladder suspension N/A 09/22/2015    Procedure: TRANSVAGINAL TAPE (TVT) PROCEDURE;  Surgeon: Nunzio Cobbs, MD;  Location: [facility];  Service: Gynecology;  Laterality: N/A;  . Cystoscopy N/A 09/22/2015    Procedure: CYSTOSCOPY;  Surgeon: Nunzio Cobbs, MD;  Location: [facility];  Service: Gynecology;  Laterality: N/A;    Current Outpatient Prescriptions  Medication Sig Dispense Refill  . ciprofloxacin (CIPRO) 250 MG tablet Take 1 tablet (250 mg total) by mouth 2 (  two) times daily. 14 tablet 0  . cycloSPORINE (RESTASIS) 0.05 % ophthalmic emulsion Place 1 drop into both eyes 2 (two) times daily.    [name] ibuprofen (ADVIL,MOTRIN) 600 MG tablet Take 1 tablet (600 mg total) by mouth every 6 (six) hours as needed. (Patient not taking: Reported on 09/07/2015) 30 tablet 0  . methocarbamol (ROBAXIN) 500 MG tablet Take 1 tablet (500 mg total) by mouth 2 (two) times daily. (Patient taking differently: Take 500 mg by mouth 2 (two) times daily as needed for muscle spasms. ) 20 tablet 0  . oxyCODONE-acetaminophen (PERCOCET/ROXICET) 5-325 MG tablet Take 1-2 tablets by mouth every 4 (four) hours  as needed for severe pain (moderate to severe pain (when tolerating fluids)). 30 tablet 0   No current facility-administered medications for this visit.     ALLERGIES: Review of patient's allergies indicates no known allergies.  Family History  Problem Relation Age of Onset  . Arthritis Mother   . Hypertension Mother   . Hyperlipidemia Mother   . Stroke Mother   . Arthritis Father   . Hyperlipidemia Father   . Hypertension Father   . Diabetes Father   . Melanoma Maternal Aunt   . Melanoma Paternal Uncle   . Arthritis Maternal Grandmother   . Alcohol abuse Maternal Grandfather   . Squamous cell carcinoma Maternal Grandfather   . Arthritis Paternal Grandmother   . Melanoma Paternal Grandmother   . Alcohol abuse Paternal Grandfather   . Squamous cell carcinoma Maternal Aunt   . Colon cancer Paternal Uncle   . Liver cancer Paternal Uncle     Social History   Social History  . Marital Status: Married    Spouse Name: N/A  . Number of Children: N/A  . Years of Education: N/A   Occupational History  . Not on file.   Social History Main Topics  . Smoking status: Former Smoker    Quit date: 09/22/2000  . Smokeless tobacco: Never Used  . Alcohol Use: 0.0 oz/week    0 Standard drinks or equivalent per week     Comment: rare  . Drug Use: No  . Sexual Activity:    Partners: Male    Birth Control/ Protection: Surgical     Comment: BTL/Hyst   Other Topics Concern  . 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  PHYSICAL EXAMINATION:    LMP 12/24/2014    General appearance: alert, cooperative and appears stated age   Pelvic: External genitalia:  no lesions.  Suprapubic bruising.  Suture lines intact.                Bimanual Exam:  Uterus:  uterus  absent, good vaginal length and caliber.  Sutures intact on anterior and posterior walls.  No agglutination.               Adnexa: no mass, fullness, tenderness    Foley removed and patient successful at doing self cath now.   Residual volume of 10 cc - clear, yellow urine.   Chaperone was present for exam.  ASSESSMENT  Status post anterior and posterior colporrhaphy, TVT and cysto.  Urinary retention post op.  PLAN  Counseled regarding intermittent self cath.  Given 5 cath tips.  Will check her first PVR and do self cath for continued PVRs of over 100 cc.  OK to stop Cipro.  Follow up for 6 week appointment.    An After Visit Summary was printed and given to the patient.

## 2015-10-02 ENCOUNTER — Telehealth: Payer: Self-pay | Admitting: Obstetrics and Gynecology

## 2015-10-02 NOTE — Telephone Encounter (Signed)
I am glad to hear the good news of her voiding success.  I would recommend she stop doing self cath.  If she needs medication for bladder spams, she can take urispas 100 mg po tid.  #15, RF zero.  This can cause urinary retention.

## 2015-10-02 NOTE — Telephone Encounter (Signed)
Patient called and said, "I was told to call with an update: I am voiding on my own now for the most part. Morning is most difficult. I have been doing a straight-cath before bed to be sure I am voiding all the way. I think I'll be voiding all on my own in in a couple days."

## 2015-10-02 NOTE — Telephone Encounter (Signed)
Spoke with patient. Anterior and posterior repair, transvaginal tape procedure with cystoscopy on 09/22/2015. Patient states that she has been able to void throughout the day on her own. She is using the straight cath in the morning and before bed. Reports when using the straight cath she is getting less than 22 cc residual. Denies any burning with urination, fevers or chills. Reports she is having spasms that she feels are more "uterine" than bladder. "I think I should be able to void completely on my own in a few days. I am not having any problems." Advised if she is unable to urinate over the weekend, develops and symptoms, or has any concerns she may be seen at the [facility] for evaluation. She is agreeable. Advised I will let Dr.Silva know and return call if she has any additional recommendations. She is agreeable.

## 2015-10-05 MED ORDER — FLAVOXATE HCL 100 MG PO TABS: 100.0000 mg | ORAL_TABLET | Freq: Three times a day (TID) | ORAL | Status: DC | PRN

## 2015-10-05 NOTE — Telephone Encounter (Signed)
Spoke with patient. Advised of message as seen below from [place]. She states she stopped self cathing yesterday, but does feel she is having bladder spasms. Advised I will send in rx for Urispas 100 mg po tid #15 0RF to pharmacy on file. Aware this medication can cause urinary retention so she should use this as needed and continue to monitor her urinary output. She is agreeable and will call with any questions or concerns.  Routing to provider for final review. Patient agreeable to disposition. Will close encounter.

## 2015-11-02 ENCOUNTER — Ambulatory Visit (INDEPENDENT_AMBULATORY_CARE_PROVIDER_SITE_OTHER): Payer: No Typology Code available for payment source | Admitting: Obstetrics and Gynecology

## 2015-11-02 ENCOUNTER — Encounter: Payer: Self-pay | Admitting: Obstetrics and Gynecology

## 2015-11-02 VITALS — BP 128/78 | HR 70 | Ht 65.0 in | Wt 244.8 lb

## 2015-11-02 DIAGNOSIS — Z9889 Other specified postprocedural states: Secondary | ICD-10-CM

## 2015-11-02 NOTE — Progress Notes (Signed)
Patient ID: Diane Scott, female   DOB: 12-18-1975, 40 y.o.   MRN: [id number] GYNECOLOGY  VISIT   HPI: 40 y.o.   Married  Caucasian  female   G2P2002 with Patient's last menstrual period was 12/24/2014.   here for 6 week follow up ANTERIOR (CYSTOCELE) AND POSTERIOR REPAIR (RECTOCELE) (N/A Vagina )  TRANSVAGINAL TAPE (TVT) PROCEDURE (N/A Vagina )  CYSTOSCOPY (N/A Urethra).   Voiding OK but the first morning is double voiding.  Looses a drop or two of urine a few minutes after voiding. No leak with cough, laugh.  No real frequency, urgency, or spasms.  No vaginal bleeding.   No problems with BMS.    Really feels ready to return to work to exercise her brain.    GYNECOLOGIC HISTORY: Patient's last menstrual period was 12/24/2014. Contraception:Hysterectomy Menopausal hormone therapy: None Last mammogram: None Last pap smear: 10-23-14 Neg:Neg HR HPV        OB History    Gravida Para Term Preterm AB TAB SAB Ectopic Multiple Living   2 2 2       2          Patient Active Problem List   Diagnosis Date Noted  . Status post surgery 09/22/2015  . Fever postop 01/15/2015  . Stress incontinence 11/20/2014  . OA (osteoarthritis) of knee 12/26/2013  . Urgency of micturation 12/23/2010  . FOM (frequency of micturition) 12/23/2010  . Cystocele, lateral 12/23/2010  . Headache, migraine 11/11/2010  . Family history of diabetes mellitus 10/05/2009  . Atypical nevus 10/05/2009  . Clinical depression 10/05/2009  . Carpal tunnel syndrome 10/05/2009  . Tubular adenoma 05/12/2009  . Depression, postpartum 05/12/2009  . Acid reflux 05/12/2009  . Biliary calculi 05/12/2009    Past Medical History  Diagnosis Date  . Dysplasia of skin   . Depression   . Frequent headaches   . Migraines     numbness on side of face with them  . Cystocele   . Arthritis   . Migraine headache with aura 2012  . GERD (gastroesophageal reflux disease)     Past Surgical History  Procedure Laterality  Date  . Cholecystectomy  2008  . Tubal ligation    . Uterine ablation  2/13  . Orif finger fracture    . Endometrial biopsy      neg per patient  . Colonoscopy    . Robotic assisted total hysterectomy N/A 01/12/2015    Procedure: ROBOTIC ASSISTED TOTAL HYSTERECTOMY;  Surgeon: Megan Salon, MD;  Location: [facility];  Service: Gynecology;  Laterality: N/A;  . Bilateral salpingectomy Bilateral 01/12/2015    Procedure: BILATERAL SALPINGECTOMY;  Surgeon: Megan Salon, MD;  Location: [facility];  Service: Gynecology;  Laterality: Bilateral;  . Cystoscopy N/A 01/12/2015    Procedure: CYSTOSCOPY;  Surgeon: Megan Salon, MD;  Location: [facility];  Service: Gynecology;  Laterality: N/A;  . Leg skin lesion  biopsy / excision Left 01/20/2015    [facility]-moderate dysplasia per patient  . Anterior and posterior repair N/A 09/22/2015    Procedure: ANTERIOR (CYSTOCELE) AND POSTERIOR REPAIR (RECTOCELE) ;  Surgeon: Nunzio Cobbs, MD;  Location: [facility];  Service: Gynecology;  Laterality: N/A;  . Bladder suspension N/A 09/22/2015    Procedure: TRANSVAGINAL TAPE (TVT) PROCEDURE;  Surgeon: Nunzio Cobbs, MD;  Location: [facility];  Service: Gynecology;  Laterality: N/A;  . Cystoscopy N/A 09/22/2015    Procedure: CYSTOSCOPY;  Surgeon: Nunzio Cobbs,  MD;  Location: [facility];  Service: Gynecology;  Laterality: N/A;    Current Outpatient Prescriptions  Medication Sig Dispense Refill  . cycloSPORINE (RESTASIS) 0.05 % ophthalmic emulsion Place 1 drop into both eyes 2 (two) times daily.    [name] ibuprofen (ADVIL,MOTRIN) 600 MG tablet Take 1 tablet (600 mg total) by mouth every 6 (six) hours as needed. 30 tablet 0  . flavoxATE (URISPAS) 100 MG tablet Take 1 tablet (100 mg total) by mouth 3 (three) times daily as needed for bladder spasms. (Patient not taking: Reported on 11/02/2015) 15 tablet 0   No current facility-administered medications for this visit.     ALLERGIES: Review of patient's  allergies indicates no known allergies.  Family History  Problem Relation Age of Onset  . Arthritis Mother   . Hypertension Mother   . Hyperlipidemia Mother   . Stroke Mother   . Arthritis Father   . Hyperlipidemia Father   . Hypertension Father   . Diabetes Father   . Melanoma Maternal Aunt   . Melanoma Paternal Uncle   . Arthritis Maternal Grandmother   . Alcohol abuse Maternal Grandfather   . Squamous cell carcinoma Maternal Grandfather   . Arthritis Paternal Grandmother   . Melanoma Paternal Grandmother   . Alcohol abuse Paternal Grandfather   . Squamous cell carcinoma Maternal Aunt   . Colon cancer Paternal Uncle   . Liver cancer Paternal Uncle     Social History   Social History  . Marital Status: Married    Spouse Name: N/A  . Number of Children: N/A  . Years of Education: N/A   Occupational History  . Not on file.   Social History Main Topics  . Smoking status: Former Smoker    Quit date: 09/22/2000  . Smokeless tobacco: Never Used  . Alcohol Use: 0.0 oz/week    0 Standard drinks or equivalent per week     Comment: rare  . Drug Use: No  . Sexual Activity:    Partners: Male    Birth Control/ Protection: Surgical     Comment: BTL/Hyst   Other Topics Concern  . 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  PHYSICAL EXAMINATION:    BP 128/78 mmHg  Pulse 70  Ht 5\' 5"  (1.651 m)  Wt 244 lb 12.8 oz (111.041 kg)  BMI 40.74 kg/m2  LMP 12/24/2014    General appearance: alert, cooperative and appears stated age   Pelvic: External genitalia:   SP incision intact.               Urethra:  normal appearing urethra with no masses, tenderness or lesions              Bartholins and Skenes: normal                 Vagina: normal appearing vagina  with normal color and discharge, no lesions.  Sutures present.  Sling protected.              Cervix: absent                Bimanual Exam:  Uterus:  uterus absent  Adnexa: normal adnexa and no mass, fullness, tenderness                Chaperone was present for exam.  ASSESSMENT  Doing well post op.   PLAN  Counseled regarding return to work on 11/03/15.  No lifting over 10 pounds for 3 months post op.  Return to work paperwork is filled out by me and office staff is going to make appropriate copies. Follow up for 3 month recheck appointment.   An After Visit Summary was printed and given to the patient.

## 2015-12-17 ENCOUNTER — Encounter: Payer: Self-pay | Admitting: Obstetrics and Gynecology

## 2015-12-17 ENCOUNTER — Ambulatory Visit (INDEPENDENT_AMBULATORY_CARE_PROVIDER_SITE_OTHER): Payer: No Typology Code available for payment source | Admitting: Obstetrics and Gynecology

## 2015-12-17 VITALS — BP 110/64 | HR 80 | Ht 65.0 in | Wt 245.4 lb

## 2015-12-17 DIAGNOSIS — Z9889 Other specified postprocedural states: Secondary | ICD-10-CM

## 2015-12-17 NOTE — Progress Notes (Signed)
Patient ID: Diane Scott, female   DOB: 08-12-1976, 40 y.o.   MRN: [id number] GYNECOLOGY  VISIT   HPI: 40 y.o.   Married  Caucasian  female   G2P2002 with Patient's last menstrual period was 12/24/2014.   here for 12 week post ANTERIOR (CYSTOCELE) AND POSTERIOR REPAIR (RECTOCELE) (N/A Vagina )  TRANSVAGINAL TAPE (TVT) PROCEDURE (N/A Vagina )  CYSTOSCOPY (N/A Urethra).   Was sick with cough and did not leak. No leak with laugh or sneeze now! No frequency or urgency.  Can loose a drop of urine shortly after voiding.  Feels like she is not controlling flatus as well.  Not straining to have have bowel movements. Wants to start [place] again.  Has already done PT.  Energy level improving.   Is back at full time work with restrictions.   GYNECOLOGIC HISTORY: Patient's last menstrual period was 12/24/2014. Contraception:  Hysterectomy Menopausal hormone therapy:  None Last mammogram:  PCP doesn't feel she should have until age 2 per new guidelines. Last pap smear: 10-23-14 Neg:Neg HR HPV          OB History    Gravida Para Term Preterm AB TAB SAB Ectopic Multiple Living   2 2 2       2          Patient Active Problem List   Diagnosis Date Noted  . Status post surgery 09/22/2015  . Fever postop 01/15/2015  . Stress incontinence 11/20/2014  . OA (osteoarthritis) of knee 12/26/2013  . Urgency of micturation 12/23/2010  . FOM (frequency of micturition) 12/23/2010  . Cystocele, lateral 12/23/2010  . Headache, migraine 11/11/2010  . Family history of diabetes mellitus 10/05/2009  . Atypical nevus 10/05/2009  . Clinical depression 10/05/2009  . Carpal tunnel syndrome 10/05/2009  . Tubular adenoma 05/12/2009  . Depression, postpartum 05/12/2009  . Acid reflux 05/12/2009  . Biliary calculi 05/12/2009    Past Medical History  Diagnosis Date  . Dysplasia of skin   . Depression   . Frequent headaches   . Migraines     numbness on side of face with them  . Cystocele   .  Arthritis   . Migraine headache with aura 2012  . GERD (gastroesophageal reflux disease)     Past Surgical History  Procedure Laterality Date  . Cholecystectomy  2008  . Tubal ligation    . Uterine ablation  2/13  . Orif finger fracture    . Endometrial biopsy      neg per patient  . Colonoscopy    . Robotic assisted total hysterectomy N/A 01/12/2015    Procedure: ROBOTIC ASSISTED TOTAL HYSTERECTOMY;  Surgeon: Megan Salon, MD;  Location: [facility];  Service: Gynecology;  Laterality: N/A;  . Bilateral salpingectomy Bilateral 01/12/2015    Procedure: BILATERAL SALPINGECTOMY;  Surgeon: Megan Salon, MD;  Location: [facility];  Service: Gynecology;  Laterality: Bilateral;  . Cystoscopy N/A 01/12/2015    Procedure: CYSTOSCOPY;  Surgeon: Megan Salon, MD;  Location: [facility];  Service: Gynecology;  Laterality: N/A;  . Leg skin lesion  biopsy / excision Left 01/20/2015    [facility]-moderate dysplasia per patient  . Anterior and posterior repair N/A 09/22/2015    Procedure: ANTERIOR (CYSTOCELE) AND POSTERIOR REPAIR (RECTOCELE) ;  Surgeon: Nunzio Cobbs, MD;  Location: [facility];  Service: Gynecology;  Laterality: N/A;  . Bladder suspension N/A 09/22/2015    Procedure: TRANSVAGINAL TAPE (TVT) PROCEDURE;  Surgeon: Nunzio Cobbs,  MD;  Location: [facility];  Service: Gynecology;  Laterality: N/A;  . Cystoscopy N/A 09/22/2015    Procedure: CYSTOSCOPY;  Surgeon: Nunzio Cobbs, MD;  Location: [facility];  Service: Gynecology;  Laterality: N/A;    Current Outpatient Prescriptions  Medication Sig Dispense Refill  . cycloSPORINE (RESTASIS) 0.05 % ophthalmic emulsion Place 1 drop into both eyes 2 (two) times daily.     No current facility-administered medications for this visit.     ALLERGIES: Review of patient's allergies indicates no known allergies.  Family History  Problem Relation Age of Onset  . Arthritis Mother   . Hypertension Mother   . Hyperlipidemia Mother   .  Stroke Mother   . Arthritis Father   . Hyperlipidemia Father   . Hypertension Father   . Diabetes Father   . Melanoma Maternal Aunt   . Melanoma Paternal Uncle   . Arthritis Maternal Grandmother   . Alcohol abuse Maternal Grandfather   . Squamous cell carcinoma Maternal Grandfather   . Arthritis Paternal Grandmother   . Melanoma Paternal Grandmother   . Alcohol abuse Paternal Grandfather   . Squamous cell carcinoma Maternal Aunt   . Colon cancer Paternal Uncle   . Liver cancer Paternal Uncle     Social History   Social History  . Marital Status: Married    Spouse Name: N/A  . Number of Children: N/A  . Years of Education: N/A   Occupational History  . Not on file.   Social History Main Topics  . Smoking status: Former Smoker    Quit date: 09/22/2000  . Smokeless tobacco: Never Used  . Alcohol Use: 0.0 oz/week    0 Standard drinks or equivalent per week     Comment: rare  . Drug Use: No  . Sexual Activity:    Partners: Male    Birth Control/ Protection: Surgical     Comment: BTL/Hyst   Other Topics Concern  . 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  PHYSICAL EXAMINATION:    BP 110/64 mmHg  Pulse 80  Ht 5\' 5"  (1.651 m)  Wt 245 lb 6.4 oz (111.313 kg)  BMI 40.84 kg/m2  LMP 12/24/2014    General appearance: alert, cooperative and appears stated age  Pelvic: External genitalia:  no lesions              Urethra:  normal appearing urethra with no masses, tenderness or lesions              Bartholins and Skenes: normal                 Vagina: normal appearing vagina with normal color and discharge, no lesions              Cervix: absent            Bimanual Exam:  Uterus:  uterus absent              Adnexa: normal adnexa and no mass, fullness,  tenderness              Rectal exam: Yes.  .  Confirms.  Anus:  normal sphincter tone, no lesions  Chaperone was present for exam.  ASSESSMENT  Doing well post op.  No GSI. Good pelvic support.  PLAN  Start [place]. She declines return to PT care.  States she will do this on her own.  OK to return to unrestricted work on 12/21/15. Papers signed and copy to patient.  Follow up for annual exam in June 2017.  Patient expressed appreciation for her surgical care.    An After Visit Summary was printed and given to the patient.

## 2016-01-30 ENCOUNTER — Encounter: Payer: Self-pay | Admitting: Family Medicine

## 2016-02-02 ENCOUNTER — Other Ambulatory Visit: Payer: Self-pay | Admitting: *Deleted

## 2016-02-02 MED ORDER — SCOPOLAMINE 1 MG/3DAYS TD PT72: 1.0000 | MEDICATED_PATCH | TRANSDERMAL | Status: DC

## 2016-02-02 NOTE — Telephone Encounter (Signed)
I called the pt and she stated she does not have a history of glaucoma.  She is aware the Rx was sent to her pharmacy.

## 2016-02-02 NOTE — Telephone Encounter (Signed)
I called the pt and she stated she does not have a history of glaucoma and she is aware the Rx was sent to her pharmacy.

## 2016-02-19 ENCOUNTER — Ambulatory Visit (INDEPENDENT_AMBULATORY_CARE_PROVIDER_SITE_OTHER): Payer: No Typology Code available for payment source | Admitting: Obstetrics & Gynecology

## 2016-02-19 ENCOUNTER — Telehealth: Payer: Self-pay | Admitting: Emergency Medicine

## 2016-02-19 ENCOUNTER — Encounter: Payer: Self-pay | Admitting: Obstetrics & Gynecology

## 2016-02-19 VITALS — BP 106/70 | HR 72 | Resp 16 | Ht 65.25 in | Wt 245.0 lb

## 2016-02-19 DIAGNOSIS — M25551 Pain in right hip: Secondary | ICD-10-CM | POA: Diagnosis not present

## 2016-02-19 DIAGNOSIS — Z01419 Encounter for gynecological examination (general) (routine) without abnormal findings: Secondary | ICD-10-CM | POA: Diagnosis not present

## 2016-02-19 NOTE — Progress Notes (Signed)
40 y.o. [id number] MarriedCaucasianF here for annual exam.  Doing well.  No vaginal bleeding.  Had cystocele and posterior repair.  Had a cold with coughing and sneezing and she reports she was dry!!  Complaint of right hip "tightness" since her anterior/posterior repair.  Feels at times like her hip "locks" when she walks.  Repositioning will help.  Extensive walking exacerbates symptoms.  No pain without movement.    Patient's last menstrual period was 12/24/2014.          Sexually active: Yes.    The current method of family planning is status post hysterectomy.    Exercising: Yes.    walking Smoker:  no  Health Maintenance: Pap:  10-23-14 neg HPV HR neg History of abnormal Pap:  Yes, more than 1yrs ago per patient MMG:  None.  Pt and I discussed guidelines.  She is going to start these at age 4. Colonoscopy:  2010 polyps, past due BMD:   none TDaP:  2013 Pneumonia vaccine(s):  no Zostavax:   no Hep C testing: not done Screening Labs: none, Hb today: not done, Urine today: pt declined Self breast exam: done monthly   reports that she quit smoking about 15 years ago. She has never used smokeless tobacco. She reports that she drinks alcohol. She reports that she does not use illicit drugs.  Past Medical History  Diagnosis Date  . Dysplasia of skin   . Depression   . Frequent headaches   . Migraines     numbness on side of face with them  . Cystocele   . Arthritis   . Migraine headache with aura 2012  . GERD (gastroesophageal reflux disease)     Past Surgical History  Procedure Laterality Date  . Cholecystectomy  2008  . Tubal ligation    . Uterine ablation  2/13  . Orif finger fracture    . Endometrial biopsy      neg per patient  . Colonoscopy    . Robotic assisted total hysterectomy N/A 01/12/2015    Procedure: ROBOTIC ASSISTED TOTAL HYSTERECTOMY;  Surgeon: Megan Salon, MD;  Location: [facility];  Service: Gynecology;  Laterality: N/A;  . Bilateral salpingectomy  Bilateral 01/12/2015    Procedure: BILATERAL SALPINGECTOMY;  Surgeon: Megan Salon, MD;  Location: [facility];  Service: Gynecology;  Laterality: Bilateral;  . Cystoscopy N/A 01/12/2015    Procedure: CYSTOSCOPY;  Surgeon: Megan Salon, MD;  Location: [facility];  Service: Gynecology;  Laterality: N/A;  . Leg skin lesion  biopsy / excision Left 01/20/2015    [facility]-moderate dysplasia per patient  . Anterior and posterior repair N/A 09/22/2015    Procedure: ANTERIOR (CYSTOCELE) AND POSTERIOR REPAIR (RECTOCELE) ;  Surgeon: Nunzio Cobbs, MD;  Location: [facility];  Service: Gynecology;  Laterality: N/A;  . Bladder suspension N/A 09/22/2015    Procedure: TRANSVAGINAL TAPE (TVT) PROCEDURE;  Surgeon: Nunzio Cobbs, MD;  Location: [facility];  Service: Gynecology;  Laterality: N/A;  . Cystoscopy N/A 09/22/2015    Procedure: CYSTOSCOPY;  Surgeon: Nunzio Cobbs, MD;  Location: [facility];  Service: Gynecology;  Laterality: N/A;    Current Outpatient Prescriptions  Medication Sig Dispense Refill  . cycloSPORINE (RESTASIS) 0.05 % ophthalmic emulsion Place 1 drop into both eyes 2 (two) times daily.     No current facility-administered medications for this visit.    Family History  Problem Relation Age of Onset  . Arthritis Mother   .  Hypertension Mother   . Hyperlipidemia Mother   . Stroke Mother   . Arthritis Father   . Hyperlipidemia Father   . Hypertension Father   . Diabetes Father   . Melanoma Maternal Aunt   . Melanoma Paternal Uncle   . Arthritis Maternal Grandmother   . Alcohol abuse Maternal Grandfather   . Squamous cell carcinoma Maternal Grandfather   . Arthritis Paternal Grandmother   . Melanoma Paternal Grandmother   . Alcohol abuse Paternal Grandfather   . Squamous cell carcinoma Maternal Aunt   . Colon cancer Paternal Uncle   . Liver cancer Paternal Uncle     ROS:  Pertinent items are noted in HPI.  Otherwise, a comprehensive ROS was  negative.  Exam:   BP 106/70 mmHg  Pulse 72  Resp 16  Ht 5' 5.25" (1.657 m)  Wt 245 lb (111.131 kg)  BMI 40.48 kg/m2  LMP 12/24/2014  Weight change: +1#   Height:   Height: 5' 5.25" (165.7 cm)  Ht Readings from Last 3 Encounters:  02/19/16 5' 5.25" (1.657 m)  12/17/15 5\' 5"  (1.651 m)  11/02/15 5\' 5"  (1.651 m)   General appearance: alert, cooperative and appears stated age Head: Normocephalic, without obvious abnormality, atraumatic Neck: no adenopathy, supple, symmetrical, trachea midline and thyroid normal to inspection and palpation Lungs: clear to auscultation bilaterally Breasts: normal appearance, no masses or tenderness Heart: regular rate and rhythm Abdomen: soft, non-tender; bowel sounds normal; no masses,  no organomegaly Extremities: extremities normal, atraumatic, no cyanosis or edema Skin: Skin color, texture, turgor normal. No rashes or lesions Lymph nodes: Cervical, supraclavicular, and axillary nodes normal. No abnormal inguinal nodes palpated Neurologic: Grossly normal   Pelvic: External genitalia:  no lesions              Urethra:  normal appearing urethra with no masses, tenderness or lesions              Bartholins and Skenes: normal                 Vagina: normal appearing vagina with normal color and discharge, no lesions              Cervix: absent              Pap taken: No. Bimanual Exam:  Uterus:  uterus absent              Adnexa: no mass, fullness, tenderness               Rectovaginal: Confirms               Anus:  normal sphincter tone, no lesions  Chaperone was present for exam.  A:  Well Woman with normal exam H/O Robotic TLH/bilateral salpingectomy 3/16 then ant repair and posterior repair 1/17 H/O migraine with aura Strong family hx of melanoma H/O prior abnormal colonoscopy Mildly elevated HbA1C Right hip pain  P:   Mammogram screening will be started at age 88 pap smear not indicated Labs in January 1/17 Referral to Almedia Balls,  MD Return annually or prn

## 2016-02-19 NOTE — Telephone Encounter (Signed)
Call to patient with change of appointment. Patient agreeable.  Will close encounter.

## 2016-08-02 ENCOUNTER — Other Ambulatory Visit: Payer: Self-pay | Admitting: *Deleted

## 2016-08-11 ENCOUNTER — Ambulatory Visit (INDEPENDENT_AMBULATORY_CARE_PROVIDER_SITE_OTHER): Payer: PRIVATE HEALTH INSURANCE | Admitting: Gastroenterology

## 2016-08-11 ENCOUNTER — Encounter: Payer: Self-pay | Admitting: Gastroenterology

## 2016-08-11 ENCOUNTER — Other Ambulatory Visit (INDEPENDENT_AMBULATORY_CARE_PROVIDER_SITE_OTHER): Payer: PRIVATE HEALTH INSURANCE

## 2016-08-11 VITALS — BP 90/62 | HR 80 | Ht 65.0 in | Wt 248.6 lb

## 2016-08-11 DIAGNOSIS — D508 Other iron deficiency anemias: Secondary | ICD-10-CM | POA: Diagnosis not present

## 2016-08-11 DIAGNOSIS — K760 Fatty (change of) liver, not elsewhere classified: Secondary | ICD-10-CM

## 2016-08-11 DIAGNOSIS — Z86010 Personal history of colonic polyps: Secondary | ICD-10-CM

## 2016-08-11 LAB — COMPREHENSIVE METABOLIC PANEL WITH GFR
ALT: 23 U/L (ref 0–35)
AST: 15 U/L (ref 0–37)
Albumin: 3.9 g/dL (ref 3.5–5.2)
Alkaline Phosphatase: 67 U/L (ref 39–117)
BUN: 12 mg/dL (ref 6–23)
CO2: 32 meq/L (ref 19–32)
Calcium: 9.1 mg/dL (ref 8.4–10.5)
Chloride: 102 meq/L (ref 96–112)
Creatinine, Ser: 0.79 mg/dL (ref 0.40–1.20)
GFR: 85.03 mL/min
Glucose, Bld: 122 mg/dL — ABNORMAL HIGH (ref 70–99)
Potassium: 3.6 meq/L (ref 3.5–5.1)
Sodium: 140 meq/L (ref 135–145)
Total Bilirubin: 0.4 mg/dL (ref 0.2–1.2)
Total Protein: 7 g/dL (ref 6.0–8.3)

## 2016-08-11 LAB — IBC PANEL
Iron: 80 ug/dL (ref 42–145)
Saturation Ratios: 21.7 % (ref 20.0–50.0)
Transferrin: 263 mg/dL (ref 212.0–360.0)

## 2016-08-11 LAB — FERRITIN: Ferritin: 60.1 ng/mL (ref 10.0–291.0)

## 2016-08-11 MED ORDER — NA SULFATE-K SULFATE-MG SULF 17.5-3.13-1.6 GM/177ML PO SOLN: 1.0000 | Freq: Once | ORAL | 0 refills | Status: AC

## 2016-08-11 NOTE — Progress Notes (Signed)
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 She was last seen by Dr. Olevia Perches in August 2016.  She has a history of tubular adenoma on colonoscopy in 2009 which was done at [facility] [place].  She has a  history of mild anemia. Last hemoglobin 10.8, hematocrit 33. There is a positive family history of colon cancer in her paternal uncle who was diagnosed with stage IV colon cancer several years ago at age of 71. Her father had colon polyps. She underwent robotic assisted to TAH in May 2016. She denies any lower GI symptoms. There is a history of irritable bowel syndrome which is currently under good control. She has undergone  upper endoscopy in the past for gastroesophageal reflux for which she takes Pepcid 20 mg when necessary. Denies any nausea, vomiting, abdominal pain, melena or bright red blood per rectum     Outpatient Encounter Prescriptions as of 08/11/2016  Medication Sig  . Dexmethylphenidate HCl 30 MG CP24 Take 1 capsule by mouth daily.  [name] lamoTRIgine (LAMICTAL) 150 MG tablet Take 1 tablet by mouth daily.  . [DISCONTINUED] cycloSPORINE (RESTASIS) 0.05 % ophthalmic emulsion Place 1 drop into both eyes 2 (two) times daily.   No facility-administered encounter medications on file as of 08/11/2016.     Allergies as of 08/11/2016  . (No Known Allergies)    Past Medical History:  Diagnosis Date  . Arthritis   . Cystocele   . Depression   . Dysplasia of skin   . Frequent headaches   . GERD (gastroesophageal reflux disease)   . Migraine headache with aura 2012  . Migraines    numbness on side of face with them    Past  Surgical History:  Procedure Laterality Date  . ANTERIOR AND POSTERIOR REPAIR N/A 09/22/2015   Procedure: ANTERIOR (CYSTOCELE) AND POSTERIOR REPAIR (RECTOCELE) ;  Surgeon: Nunzio Cobbs, MD;  Location: [facility];  Service: Gynecology;  Laterality: N/A;  . BILATERAL SALPINGECTOMY Bilateral 01/12/2015   Procedure: BILATERAL SALPINGECTOMY;  Surgeon: Megan Salon, MD;  Location: [facility];  Service: Gynecology;  Laterality: Bilateral;  . BLADDER SUSPENSION N/A 09/22/2015   Procedure: TRANSVAGINAL TAPE (TVT) PROCEDURE;  Surgeon: Nunzio Cobbs, MD;  Location: [facility];  Service: Gynecology;  Laterality: N/A;  . CHOLECYSTECTOMY  2008  . COLONOSCOPY    . CYSTOSCOPY N/A 01/12/2015   Procedure: CYSTOSCOPY;  Surgeon: Megan Salon, MD;  Location: [facility];  Service: Gynecology;  Laterality: N/A;  . CYSTOSCOPY N/A 09/22/2015   Procedure: CYSTOSCOPY;  Surgeon: Nunzio Cobbs, MD;  Location: [facility];  Service: Gynecology;  Laterality: N/A;  . endometrial biopsy     neg per patient  . LEG SKIN LESION  BIOPSY / EXCISION Left 01/20/2015   [facility]-moderate dysplasia per patient  . ORIF FINGER FRACTURE    . ROBOTIC ASSISTED TOTAL HYSTERECTOMY N/A 01/12/2015   Procedure: ROBOTIC ASSISTED TOTAL HYSTERECTOMY;  Surgeon: Megan Salon, MD;  Location: [facility];  Service: Gynecology;  Laterality: N/A;  . TUBAL LIGATION    . uterine ablation  2/13  Family History  Problem Relation Age of Onset  . Arthritis Mother   . Hypertension Mother   . Hyperlipidemia Mother   . Stroke Mother   . Arthritis Father   . Hyperlipidemia Father   . Hypertension Father   . Diabetes Father   . Melanoma Maternal Aunt   . Melanoma Paternal Uncle   . Arthritis Maternal Grandmother   . Alcohol abuse Maternal Grandfather   . Squamous cell carcinoma Maternal Grandfather   . Arthritis Paternal Grandmother   . Melanoma Paternal Grandmother   . Alcohol abuse Paternal Grandfather   . Squamous cell  carcinoma Maternal Aunt   . Colon cancer Paternal Uncle   . Liver cancer Paternal Uncle     Social History   Social History  . Marital status: Married    Spouse name: N/A  . Number of children: N/A  . Years of education: N/A   Occupational History  . Not on file.   Social History Main Topics  . Smoking status: Former Smoker    Quit date: 09/22/2000  . Smokeless tobacco: Never Used  . Alcohol use 0.0 - 0.6 oz/week  . Drug use: No  . Sexual activity: Yes    Partners: Male    Birth control/ protection: Surgical     Comment: BTL/Hyst   Other Topics Concern  . Not on file   Social History Narrative   Work or School: NP heaptology      Home Situation: lives with husband and 2 children      Spiritual Beliefs: none      Lifestyle: elliptical; working on diet               Review of systems: Review of Systems  Constitutional: Negative for fever and chills.  HENT: Negative.   Eyes: Negative for blurred vision.  Respiratory: Negative for cough, shortness of breath and wheezing.   Cardiovascular: Negative for chest pain and palpitations.  Gastrointestinal: as per HPI Genitourinary: Negative for dysuria, urgency, frequency and hematuria.  Musculoskeletal: Negative for myalgias, back pain and joint pain.  Skin: Negative for itching and rash.  Neurological: Negative for dizziness, tremors, focal weakness, seizures and loss of consciousness.  Endo/Heme/Allergies: Positive for seasonal allergies.  Psychiatric/Behavioral: Negative for depression, suicidal ideas and hallucinations.  All other systems reviewed and are negative.   Physical Exam: Vitals:   08/11/16 1346  BP: 90/62  Pulse: 80   Body mass index is 41.37 kg/m. Gen:      No acute distress HEENT:  EOMI, sclera anicteric Neck:     No masses; no thyromegaly Lungs:    Clear to auscultation bilaterally; normal respiratory effort CV:         Regular rate and rhythm; no murmurs Abd:      + bowel sounds; soft,  non-tender; no palpable masses, no distension Ext:    No edema; adequate peripheral perfusion Skin:      Warm and dry; no rash Neuro: alert and oriented x 3 Psych: normal mood and affect  Data Reviewed:  Reviewed labs, radiology imaging, old records and pertinent past GI work up   Assessment and Plan/Recommendations:  40 year old female with family history of colon cancer in second-degree relative (paternal uncle at age 50) and personal history of colon polyps tubular adenoma on colonoscopy in 2009 done at [facility] Patient is past due for surveillance colonoscopy We'll schedule for colonoscopy during this visit The risks and benefits as well as alternatives of endoscopic procedure(s) have been  discussed and reviewed. All questions answered. The patient agrees to proceed. Chronic anemia: We'll check iron panel and ferritin level History of fatty liver: Follow-up LFT and BMP   Greater than 50% of the time used for counseling as well as treatment plan and follow-up. She had multiple questions which were answered to her satisfaction  K. Denzil Magnuson , MD [phone] Mon-Fri 8a-5p [phone] after 5p, weekends, holidays  CC: Lucretia Kern, DO

## 2016-08-11 NOTE — Patient Instructions (Signed)
You have been scheduled for a colonoscopy. Please follow written instructions given to you at your visit today.  Please pick up your prep supplies at the pharmacy within the next 1-3 days. If you use inhalers (even only as needed), please bring them with you on the day of your procedure. Your physician has requested that you go to www.startemmi.com and enter the access code given to you at your visit today. This web site gives a general overview about your procedure. However, you should still follow specific instructions given to you by our office regarding your preparation for the procedure.  Go to the basement for labs today   Follow up as needed

## 2016-09-01 ENCOUNTER — Other Ambulatory Visit: Payer: Self-pay | Admitting: Family Medicine

## 2016-09-01 DIAGNOSIS — Z1231 Encounter for screening mammogram for malignant neoplasm of breast: Secondary | ICD-10-CM

## 2016-09-13 ENCOUNTER — Encounter: Payer: Self-pay | Admitting: Gastroenterology

## 2016-09-13 ENCOUNTER — Ambulatory Visit (AMBULATORY_SURGERY_CENTER): Payer: PRIVATE HEALTH INSURANCE | Admitting: Gastroenterology

## 2016-09-13 VITALS — BP 113/67 | HR 86 | Temp 98.6°F | Resp 12 | Ht 65.0 in | Wt 248.0 lb

## 2016-09-13 DIAGNOSIS — K621 Rectal polyp: Secondary | ICD-10-CM | POA: Diagnosis not present

## 2016-09-13 DIAGNOSIS — Z86010 Personal history of colonic polyps: Secondary | ICD-10-CM | POA: Diagnosis not present

## 2016-09-13 DIAGNOSIS — D128 Benign neoplasm of rectum: Secondary | ICD-10-CM

## 2016-09-13 MED ORDER — SODIUM CHLORIDE 0.9 % IV SOLN: 500.0000 mL | INTRAVENOUS | Status: DC

## 2016-09-13 NOTE — Patient Instructions (Signed)
YOU HAD AN ENDOSCOPIC PROCEDURE TODAY AT THE [facility]:   Refer to the procedure report that was given to you for any specific questions about what was found during the examination.  If the procedure report does not answer your questions, please call your gastroenterologist to clarify.  If you requested that your care partner not be given the details of your procedure findings, then the procedure report has been included in a sealed envelope for you to review at your convenience later.  YOU SHOULD EXPECT: Some feelings of bloating in the abdomen. Passage of more gas than usual.  Walking can help get rid of the air that was put into your GI tract during the procedure and reduce the bloating. If you had a lower endoscopy (such as a colonoscopy or flexible sigmoidoscopy) you may notice spotting of blood in your stool or on the toilet paper. If you underwent a bowel prep for your procedure, you may not have a normal bowel movement for a few days.  Please Note:  You might notice some irritation and congestion in your nose or some drainage.  This is from the oxygen used during your procedure.  There is no need for concern and it should clear up in a day or so.  SYMPTOMS TO REPORT IMMEDIATELY:   Following lower endoscopy (colonoscopy or flexible sigmoidoscopy):  Excessive amounts of blood in the stool  Significant tenderness or worsening of abdominal pains  Swelling of the abdomen that is new, acute  Fever of 100F or higher   Following upper endoscopy (EGD)  Vomiting of blood or coffee ground material  New chest pain or pain under the shoulder blades  Painful or persistently difficult swallowing  New shortness of breath  Fever of 100F or higher  Black, tarry-looking stools  For urgent or emergent issues, a gastroenterologist can be reached at any hour by calling [phone].   DIET:  We do recommend a small meal at first, but then you may proceed to your regular diet.  Drink  plenty of fluids but you should avoid alcoholic beverages for 24 hours.  ACTIVITY:  You should plan to take it easy for the rest of today and you should NOT DRIVE or use heavy machinery until tomorrow (because of the sedation medicines used during the test).    FOLLOW UP: Our staff will call the number listed on your records the next business day following your procedure to check on you and address any questions or concerns that you may have regarding the information given to you following your procedure. If we do not reach you, we will leave a message.  However, if you are feeling well and you are not experiencing any problems, there is no need to return our call.  We will assume that you have returned to your regular daily activities without incident.  If any biopsies were taken you will be contacted by phone or by letter within the next 1-3 weeks.  Please call us at [phone] if you have not heard about the biopsies in 3 weeks.    SIGNATURES/CONFIDENTIALITY: You and/or your care partner have signed paperwork which will be entered into your electronic medical record.  These signatures attest to the fact that that the information above on your After Visit Summary has been reviewed and is understood.  Full responsibility of the confidentiality of this discharge information lies with you and/or your care-partner.    Handout was given to your care partner on polyps.   You may resume your current medications today. Await biopsy results. Please call if any questions or concerns.   

## 2016-09-13 NOTE — Progress Notes (Signed)
Report to PACU, RN, vss, BBS= Clear.  

## 2016-09-13 NOTE — Op Note (Signed)
[facility] Patient Name: Diane Scott Procedure Date: 09/13/2016 10:03 AM MRN: [id number] Endoscopist: Mauri Pole , MD Age: 42 Referring MD:  Date of Birth: 03/12/1976 Gender: Female Account #: [id number] Procedure:                Colonoscopy Indications:              Surveillance: Personal history of adenomatous                            polyps on last colonoscopy > 5 years ago, Last                            colonoscopy: 2009 Medicines:                Monitored Anesthesia Care Procedure:                Pre-Anesthesia Assessment:                           - Prior to the procedure, a History and Physical                            was performed, and patient medications and                            allergies were reviewed. The patient's tolerance of                            previous anesthesia was also reviewed. The risks                            and benefits of the procedure and the sedation                            options and risks were discussed with the patient.                            All questions were answered, and informed consent                            was obtained. Prior Anticoagulants: The patient has                            taken no previous anticoagulant or antiplatelet                            agents. ASA Grade Assessment: II - A patient with                            mild systemic disease. After reviewing the risks                            and benefits, the patient was deemed in  satisfactory condition to undergo the procedure.                           After obtaining informed consent, the colonoscope                            was passed under direct vision. Throughout the                            procedure, the patient's blood pressure, pulse, and                            oxygen saturations were monitored continuously. The                            Model CF-HQ190L [id number]) scope was  introduced                            through the anus and advanced to the the terminal                            ileum, with identification of the appendiceal                            orifice and IC valve. The colonoscopy was performed                            without difficulty. The patient tolerated the                            procedure well. The quality of the bowel                            preparation was excellent. The terminal ileum,                            ileocecal valve, appendiceal orifice, and rectum                            were photographed. Scope In: 10:09:02 AM Scope Out: 10:23:32 AM Scope Withdrawal Time: 0 hours 10 minutes 32 seconds  Total Procedure Duration: 0 hours 14 minutes 30 seconds  Findings:                 The perianal and digital rectal examinations were                            normal.                           A 2 mm polyp was found in the rectum. The polyp was                            sessile. The polyp was removed with a cold biopsy  forceps. Resection and retrieval were complete.                           The exam was otherwise without abnormality. Complications:            No immediate complications. Estimated Blood Loss:     Estimated blood loss: none. Impression:               - One 2 mm polyp in the rectum, removed with a cold                            biopsy forceps. Resected and retrieved.                           - The examination was otherwise normal. Recommendation:           - Patient has a contact number available for                            emergencies. The signs and symptoms of potential                            delayed complications were discussed with the                            patient. Return to normal activities tomorrow.                            Written discharge instructions were provided to the                            patient.                           - Resume previous  diet.                           - Continue present medications.                           - Await pathology results.                           - Repeat colonoscopy in 5-10 years for surveillance                            based on pathology results.                           - Return to GI clinic PRN. Mauri Pole, MD 09/13/2016 10:28:51 AM This report has been signed electronically.

## 2016-09-13 NOTE — Progress Notes (Signed)
Called to room to assist during endoscopic procedure.  Patient ID and intended procedure confirmed with present staff. Received instructions for my participation in the procedure from the performing physician.  

## 2016-09-13 NOTE — Progress Notes (Signed)
No problems noted in the recovery room. maw 

## 2016-09-14 ENCOUNTER — Telehealth: Payer: Self-pay

## 2016-09-14 NOTE — Telephone Encounter (Signed)
  Follow up Call-  Call back number 09/13/2016  Post procedure Call Back phone  # [phone]  Permission to leave phone message Yes     Patient questions:  Do you have a fever, pain , or abdominal swelling? No. Pain Score  0 *  Have you tolerated food without any problems? Yes.    Have you been able to return to your normal activities? Yes.    Do you have any questions about your discharge instructions: Diet   No. Medications  No. Follow up visit  No.  Do you have questions or concerns about your Care? No.  Actions: * If pain score is 4 or above: No action needed, pain <4.

## 2016-09-14 NOTE — Telephone Encounter (Signed)
  Follow up Call-  Call back number 09/13/2016  Post procedure Call Back phone  # [phone]  Permission to leave phone message Yes    Patient was called for follow up after his procedure on 09/13/2016. No answer at the number given for follow up phone call. A message was left on voice mail.

## 2016-09-15 ENCOUNTER — Ambulatory Visit
Admission: RE | Admit: 2016-09-15 | Discharge: 2016-09-15 | Disposition: A | Discharge status: Home / Self Care | Payer: PRIVATE HEALTH INSURANCE | Source: Ambulatory Visit | Attending: Family Medicine | Admitting: Family Medicine

## 2016-09-15 DIAGNOSIS — Z1231 Encounter for screening mammogram for malignant neoplasm of breast: Secondary | ICD-10-CM

## 2016-09-16 ENCOUNTER — Encounter: Payer: PRIVATE HEALTH INSURANCE | Admitting: Gastroenterology

## 2016-09-16 ENCOUNTER — Encounter: Payer: Self-pay | Admitting: Gastroenterology

## 2016-09-23 ENCOUNTER — Ambulatory Visit (INDEPENDENT_AMBULATORY_CARE_PROVIDER_SITE_OTHER): Payer: PRIVATE HEALTH INSURANCE | Admitting: Family Medicine

## 2016-09-23 ENCOUNTER — Encounter: Payer: Self-pay | Admitting: Family Medicine

## 2016-09-23 VITALS — BP 102/80 | HR 111 | Temp 98.2°F | Ht 65.0 in | Wt 243.4 lb

## 2016-09-23 DIAGNOSIS — L659 Nonscarring hair loss, unspecified: Secondary | ICD-10-CM

## 2016-09-23 DIAGNOSIS — Z6841 Body Mass Index (BMI) 40.0 and over, adult: Secondary | ICD-10-CM | POA: Diagnosis not present

## 2016-09-23 DIAGNOSIS — Z0000 Encounter for general adult medical examination without abnormal findings: Secondary | ICD-10-CM | POA: Diagnosis not present

## 2016-09-23 NOTE — Patient Instructions (Addendum)
BEFORE YOU LEAVE: -biometric form -schedule lab visit in next few weeks -follow up: yearly for physical  Vit D3 800-100 IU daily  We have ordered labs or studies at this visit. It can take up to 1-2 weeks for results and processing. IF results require follow up or explanation, we will call you with instructions. Clinically stable results will be released to your [facility]. If you have not heard from [place] or cannot find your results in [facility] in 2 weeks please contact our office at [phone].  If you are not yet signed up for [facility], please consider signing up.   We recommend the following healthy lifestyle for LIFE: 1) Small portions.   Tip: eat off of a salad plate instead of a dinner plate.  Tip: if you need more or a snack choose fruits, veggies and/or a handful of nuts or seeds.  2) Eat a healthy clean diet.  * Tip: Avoid (less then 1 serving per week): processed foods, sweets, sweetened drinks, white starches (rice, flour, bread, potatoes, pasta, etc), red meat, fast foods, butter  *Tip: CHOOSE instead   * 5-9 servings per day of fresh or frozen fruits and vegetables (but not corn, potatoes, bananas, canned or dried fruit)   *nuts and seeds, beans   *olives and olive oil   *small portions of lean meats such as fish and white chicken    *small portions of whole grains  3)Get at least 150 minutes of sweaty aerobic exercise per week.  4)Reduce stress - consider counseling, meditation and relaxation to balance other aspects of your life.

## 2016-09-23 NOTE — Progress Notes (Signed)
Pre visit review using our clinic review tool, if applicable. No additional management support is needed unless otherwise documented below in the visit note. 

## 2016-09-23 NOTE — Progress Notes (Signed)
HPI:  Here for CPE:  -Concerns and/or follow up today: none Hx Obesity, cardiomegaly (s/p cardiology eval), atypical nevus, Depression, migraines, GERD. Reports she is doing well. The only concern she has is that she feels she loses too much hair in the shower and she would like to check a thyroid level. She is not getting any regular exercise. She reports she is trying to eat healthier. She wants to schedule a follow up fasting lab visit. Has biometric physical form for completion. Flu vaccine done. Sees dermatologist for skin exams.  -Taking folic acid, vitamin D or calcium: no  -Hx of HTN: no  -Vaccines: UTD  -pap history: sees Dr. Sabra Heck for gyn physicals, s/p hysterectmy for abnormal bleeding   -sexual activity: yes, female partner, no new partners  -wants STI testing (Hep C if born [birth year]): no  -FH breast, colon or ovarian ca: see FH Last mammogram: 08/2016 birads 1 Last colon cancer screening: hx adenomatous polyps and uncle with colon ca - sees GI; repeat colonoscopy done with Dr. Silverio Decamp 08/2016 - per letter due for repeat in 10 years  -Alcohol, Tobacco, drug use: see social history  Review of Systems - no fevers, unintentional weight loss, vision loss, hearing loss, chest pain, sob, hemoptysis, melena, hematochezia, hematuria, genital discharge, changing or concerning skin lesions, bleeding, bruising, loc, thoughts of self harm or SI  Past Medical History:  Diagnosis Date  . Arthritis   . Cystocele   . Depression   . Dysplasia of skin   . GERD (gastroesophageal reflux disease)   . Migraine headache with aura 2012   hx numb face with migraine  . Obesity   . Osteoarthritis of both knees   . Tubular adenoma 05/12/2009    Past Surgical History:  Procedure Laterality Date  . ANTERIOR AND POSTERIOR REPAIR N/A 09/22/2015   Procedure: ANTERIOR (CYSTOCELE) AND POSTERIOR REPAIR (RECTOCELE) ;  Surgeon: Nunzio Cobbs, MD;  Location: [facility];  Service: Gynecology;   Laterality: N/A;  . BILATERAL SALPINGECTOMY Bilateral 01/12/2015   Procedure: BILATERAL SALPINGECTOMY;  Surgeon: Megan Salon, MD;  Location: [facility];  Service: Gynecology;  Laterality: Bilateral;  . BLADDER SUSPENSION N/A 09/22/2015   Procedure: TRANSVAGINAL TAPE (TVT) PROCEDURE;  Surgeon: Nunzio Cobbs, MD;  Location: [facility];  Service: Gynecology;  Laterality: N/A;  . CHOLECYSTECTOMY  2008  . COLONOSCOPY    . CYSTOSCOPY N/A 01/12/2015   Procedure: CYSTOSCOPY;  Surgeon: Megan Salon, MD;  Location: [facility];  Service: Gynecology;  Laterality: N/A;  . CYSTOSCOPY N/A 09/22/2015   Procedure: CYSTOSCOPY;  Surgeon: Nunzio Cobbs, MD;  Location: [facility];  Service: Gynecology;  Laterality: N/A;  . endometrial biopsy     neg per patient  . LEG SKIN LESION  BIOPSY / EXCISION Left 01/20/2015   [facility]-moderate dysplasia per patient  . ORIF FINGER FRACTURE    . ROBOTIC ASSISTED TOTAL HYSTERECTOMY N/A 01/12/2015   Procedure: ROBOTIC ASSISTED TOTAL HYSTERECTOMY;  Surgeon: Megan Salon, MD;  Location: [facility];  Service: Gynecology;  Laterality: N/A;  . TUBAL LIGATION    . uterine ablation  2/13    Family History  Problem Relation Age of Onset  . Arthritis Mother   . Hypertension Mother   . Hyperlipidemia Mother   . Stroke Mother   . Arthritis Father   . Hyperlipidemia Father   . Hypertension Father   . Diabetes Father   . Melanoma Maternal Aunt   .  Melanoma Paternal Uncle   . Arthritis Maternal Grandmother   . Alcohol abuse Maternal Grandfather   . Squamous cell carcinoma Maternal Grandfather   . Arthritis Paternal Grandmother   . Melanoma Paternal Grandmother   . Alcohol abuse Paternal Grandfather   . Squamous cell carcinoma Maternal Aunt   . Colon cancer Paternal Uncle   . Liver cancer Paternal Uncle     Social History   Social History  . Marital status: Married    Spouse name: N/A  . Number of children: N/A  . Years of education: N/A   Social  History Main Topics  . Smoking status: Former Smoker    Quit date: 09/22/2000  . Smokeless tobacco: Never Used  . Alcohol use 0.0 - 0.6 oz/week  . Drug use: No  . Sexual activity: Yes    Partners: Male    Birth control/ protection: Surgical     Comment: BTL/Hyst   Other Topics Concern  . None   Social History Narrative   Work or School: NP heaptology      Home Situation: lives with husband and 2 children      Spiritual Beliefs: none      Lifestyle: elliptical; working on diet              Current Outpatient Prescriptions:  .  Dexmethylphenidate HCl 30 MG CP24, Take 1 capsule by mouth daily., Disp: , Rfl:  .  lamoTRIgine (LAMICTAL) 150 MG tablet, Take 1 tablet by mouth daily., Disp: , Rfl:   Current Facility-Administered Medications:  .  0.9 %  sodium chloride infusion, 500 mL, Intravenous, Continuous, Kavitha Nandigam V, MD  EXAM:  Vitals:   09/23/16 1514  BP: 102/80  Pulse: (!) 111  Temp: 98.2 F (36.8 C)   Body mass index is 40.5 kg/m.  GENERAL: vitals reviewed and listed below, alert, oriented, appears well hydrated and in no acute distress  HEENT: head atraumatic, PERRLA, normal appearance of eyes, ears, nose and mouth. moist mucus membranes.  NECK: supple, no masses or lymphadenopathy  LUNGS: clear to auscultation bilaterally, no rales, rhonchi or wheeze  CV: HRRR, no peripheral edema or cyanosis, normal pedal pulses  ABDOMEN: bowel sounds normal, soft, non tender to palpation, no masses, no rebound or guarding  SKIN: does skin checks with dermatologist  GU/BREAST: does with gyn  MS: normal gait, moves all extremities normally  NEURO: normal gait, speech and thought processing grossly intact, muscle tone grossly intact throughout  PSYCH: normal affect, pleasant and cooperative  ASSESSMENT AND PLAN:  Discussed the following assessment and plan:  Encounter for preventive health examination - Plan: Hemoglobin A1c, Lipid Panel  Hair loss -  Plan: TSH  BMI 40.0-44.9, adult (HCC)   -Discussed and advised all [place] preventive services health task force level A and B recommendations for age, sex and risks.  -Advised at least 150 minutes of exercise per week and a healthy diet with avoidance of (less then 1 serving per week) processed foods, white starches, red meat, fast foods and sweets and consisting of: * 5-9 servings of fresh fruits and vegetables (not corn or potatoes) *nuts and seeds, beans *olives and olive oil *lean meats such as fish and white chicken  *whole grains  -she plans to set up appointment for labs  Orders Placed This Encounter  Procedures  . Hemoglobin A1c    Standing Status:   Future    Standing Expiration Date:   12/21/2016  . Lipid Panel    Standing  Status:   Future    Standing Expiration Date:   12/21/2016  . TSH    Standing Status:   Future    Standing Expiration Date:   12/21/2016    Patient advised to return to clinic immediately if symptoms worsen or persist or new concerns.  Patient Instructions  BEFORE YOU LEAVE: -biometric form -schedule lab visit in next few weeks -follow up: yearly for physical  Vit D3 800-100 IU daily  We have ordered labs or studies at this visit. It can take up to 1-2 weeks for results and processing. IF results require follow up or explanation, we will call you with instructions. Clinically stable results will be released to your [facility]. If you have not heard from [place] or cannot find your results in [facility] in 2 weeks please contact our office at [phone].  If you are not yet signed up for [facility], please consider signing up.   We recommend the following healthy lifestyle for LIFE: 1) Small portions.   Tip: eat off of a salad plate instead of a dinner plate.  Tip: if you need more or a snack choose fruits, veggies and/or a handful of nuts or seeds.  2) Eat a healthy clean diet.  * Tip: Avoid (less then 1 serving per week): processed foods, sweets, sweetened  drinks, white starches (rice, flour, bread, potatoes, pasta, etc), red meat, fast foods, butter  *Tip: CHOOSE instead   * 5-9 servings per day of fresh or frozen fruits and vegetables (but not corn, potatoes, bananas, canned or dried fruit)   *nuts and seeds, beans   *olives and olive oil   *small portions of lean meats such as fish and white chicken    *small portions of whole grains  3)Get at least 150 minutes of sweaty aerobic exercise per week.  4)Reduce stress - consider counseling, meditation and relaxation to balance other aspects of your life.            No Follow-up on file.  Colin Benton R., DO

## 2016-09-26 ENCOUNTER — Other Ambulatory Visit: Payer: PRIVATE HEALTH INSURANCE

## 2016-10-04 ENCOUNTER — Other Ambulatory Visit (INDEPENDENT_AMBULATORY_CARE_PROVIDER_SITE_OTHER): Payer: PRIVATE HEALTH INSURANCE

## 2016-10-04 DIAGNOSIS — Z0000 Encounter for general adult medical examination without abnormal findings: Secondary | ICD-10-CM | POA: Diagnosis not present

## 2016-10-04 DIAGNOSIS — L659 Nonscarring hair loss, unspecified: Secondary | ICD-10-CM

## 2016-10-04 LAB — LIPID PANEL
CHOL/HDL RATIO: 4
CHOLESTEROL: 177 mg/dL (ref 0–200)
HDL: 41.6 mg/dL (ref >39.00)
LDL CALC: 106 mg/dL — AB (ref 0–99)
NonHDL: 135.74
TRIGLYCERIDES: 147 mg/dL (ref 0.0–149.0)
VLDL: 29.4 mg/dL (ref 0.0–40.0)

## 2016-10-04 LAB — HEMOGLOBIN A1C: Hgb A1c MFr Bld: 5.8 % (ref 4.6–6.5)

## 2016-10-04 LAB — TSH: TSH: 1.54 u[IU]/mL (ref 0.35–4.50)

## 2016-10-27 ENCOUNTER — Encounter: Payer: Self-pay | Admitting: Family Medicine

## 2016-10-27 ENCOUNTER — Ambulatory Visit (INDEPENDENT_AMBULATORY_CARE_PROVIDER_SITE_OTHER): Payer: PRIVATE HEALTH INSURANCE | Admitting: Family Medicine

## 2016-10-27 VITALS — BP 108/80 | HR 98 | Temp 98.1°F | Ht 65.0 in | Wt 240.5 lb

## 2016-10-27 DIAGNOSIS — R21 Rash and other nonspecific skin eruption: Secondary | ICD-10-CM | POA: Diagnosis not present

## 2016-10-27 MED ORDER — FLUCONAZOLE 150 MG PO TABS: 150.0000 mg | ORAL_TABLET | Freq: Every day | ORAL | 0 refills | Status: DC

## 2016-10-27 MED ORDER — MUPIROCIN 2 % EX OINT: 1.0000 "application " | TOPICAL_OINTMENT | Freq: Two times a day (BID) | CUTANEOUS | 0 refills | Status: DC

## 2016-10-27 NOTE — Patient Instructions (Signed)
BEFORE YOU LEAVE: -follow up: 1-2 weeks  Take the diflucan once daily for 3 days, then if needed every 3 days.  Combine a little hydrocortisone cream and mupirocin and apply 2x daily.  I hope you are feeling better soon! Seek care immediately if worsening, new concerns or you are not improving with treatment.  WE NOW OFFER   [facility] FAST TRACK!!!  SAME DAY Appointments for ACUTE CARE  Such as: Sprains, Injuries, cuts, abrasions, rashes, muscle pain, joint pain, back pain Colds, flu, sore throats, headache, allergies, cough, fever  Ear pain, sinus and eye infections Abdominal pain, nausea, vomiting, diarrhea, upset stomach Animal/insect bites  3 Easy Ways to Schedule: Walk-In Scheduling Call in scheduling Mychart Sign-up: [email or web address]

## 2016-10-27 NOTE — Progress Notes (Signed)
HPI:  Acute visit for rash: -x 2 week -R axillary -pruritic -tried lotrimin - not helping -no new deodorants or other exposures  ROS: See pertinent positives and negatives per HPI.  Past Medical History:  Diagnosis Date  . Arthritis   . Cystocele   . Depression   . Dysplasia of skin   . GERD (gastroesophageal reflux disease)   . Migraine headache with aura 2012   hx numb face with migraine  . Obesity   . Osteoarthritis of both knees   . Tubular adenoma 05/12/2009    Past Surgical History:  Procedure Laterality Date  . ANTERIOR AND POSTERIOR REPAIR N/A 09/22/2015   Procedure: ANTERIOR (CYSTOCELE) AND POSTERIOR REPAIR (RECTOCELE) ;  Surgeon: Nunzio Cobbs, MD;  Location: [facility];  Service: Gynecology;  Laterality: N/A;  . BILATERAL SALPINGECTOMY Bilateral 01/12/2015   Procedure: BILATERAL SALPINGECTOMY;  Surgeon: Megan Salon, MD;  Location: [facility];  Service: Gynecology;  Laterality: Bilateral;  . BLADDER SUSPENSION N/A 09/22/2015   Procedure: TRANSVAGINAL TAPE (TVT) PROCEDURE;  Surgeon: Nunzio Cobbs, MD;  Location: [facility];  Service: Gynecology;  Laterality: N/A;  . CHOLECYSTECTOMY  2008  . COLONOSCOPY    . CYSTOSCOPY N/A 01/12/2015   Procedure: CYSTOSCOPY;  Surgeon: Megan Salon, MD;  Location: [facility];  Service: Gynecology;  Laterality: N/A;  . CYSTOSCOPY N/A 09/22/2015   Procedure: CYSTOSCOPY;  Surgeon: Nunzio Cobbs, MD;  Location: [facility];  Service: Gynecology;  Laterality: N/A;  . endometrial biopsy     neg per patient  . LEG SKIN LESION  BIOPSY / EXCISION Left 01/20/2015   [facility]-moderate dysplasia per patient  . ORIF FINGER FRACTURE    . ROBOTIC ASSISTED TOTAL HYSTERECTOMY N/A 01/12/2015   Procedure: ROBOTIC ASSISTED TOTAL HYSTERECTOMY;  Surgeon: Megan Salon, MD;  Location: [facility];  Service: Gynecology;  Laterality: N/A;  . TUBAL LIGATION    . uterine ablation  2/13    Family History  Problem Relation Age of Onset  .  Arthritis Mother   . Hypertension Mother   . Hyperlipidemia Mother   . Stroke Mother   . Arthritis Father   . Hyperlipidemia Father   . Hypertension Father   . Diabetes Father   . Melanoma Maternal Aunt   . Melanoma Paternal Uncle   . Arthritis Maternal Grandmother   . Alcohol abuse Maternal Grandfather   . Squamous cell carcinoma Maternal Grandfather   . Arthritis Paternal Grandmother   . Melanoma Paternal Grandmother   . Alcohol abuse Paternal Grandfather   . Squamous cell carcinoma Maternal Aunt   . Colon cancer Paternal Uncle   . Liver cancer Paternal Uncle     Social History   Social History  . Marital status: Married    Spouse name: N/A  . Number of children: N/A  . Years of education: N/A   Social History Main Topics  . Smoking status: Former Smoker    Quit date: 09/22/2000  . Smokeless tobacco: Never Used  . Alcohol use 0.0 - 0.6 oz/week  . Drug use: No  . Sexual activity: Yes    Partners: Male    Birth control/ protection: Surgical     Comment: BTL/Hyst   Other Topics Concern  . 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  Dexmethylphenidate HCl 30 MG CP24, Take 1 capsule by mouth daily., Disp: , Rfl:  .  lamoTRIgine (LAMICTAL) 150 MG tablet, Take 1 tablet by mouth daily., Disp: , Rfl:  .  fluconazole (DIFLUCAN) 150 MG tablet, Take 1 tablet (150 mg total) by mouth daily., Disp: 6 tablet, Rfl: 0 .  mupirocin ointment (BACTROBAN) 2 %, Place 1 application into the nose 2 (two) times daily., Disp: 22 g, Rfl: 0  Current Facility-Administered Medications:  .  0.9 %  sodium chloride infusion, 500 mL, Intravenous, Continuous, Kavitha Nandigam V, MD  EXAM:  Vitals:   10/27/16 1602  BP: 108/80  Pulse: 98  Temp: 98.1 F (36.7 C)    Body mass index is  40.02 kg/m.  GENERAL: vitals reviewed and listed above, alert, oriented, appears well hydrated and in no acute distress  HEENT: atraumatic, conjunttiva clear, no obvious abnormalities on inspection of external nose and ears  NECK: no obvious masses on inspection  SKIN: sharply demarcated, slightly raise erythematous plaques R axillary region  MS: moves all extremities without noticeable abnormality  PSYCH: pleasant and cooperative, no obvious depression or anxiety  ASSESSMENT AND PLAN:  Discussed the following assessment and plan:  Skin rash  -query yeast/inflammation for scratching/ possible mild secondary bacterial infection -trial abx oint, diflucan, hydrocortisone -follow up 2 weeks -Patient advised to return or notify a doctor immediately if symptoms worsen or persist or new concerns arise.  Patient Instructions  BEFORE YOU LEAVE: -follow up: 1-2 weeks  Take the diflucan once daily for 3 days, then if needed every 3 days.  Combine a little hydrocortisone cream and mupirocin and apply 2x daily.  I hope you are feeling better soon! Seek care immediately if worsening, new concerns or you are not improving with treatment.  WE NOW OFFER   [facility] FAST TRACK!!!  SAME DAY Appointments for ACUTE CARE  Such as: Sprains, Injuries, cuts, abrasions, rashes, muscle pain, joint pain, back pain Colds, flu, sore throats, headache, allergies, cough, fever  Ear pain, sinus and eye infections Abdominal pain, nausea, vomiting, diarrhea, upset stomach Animal/insect bites  3 Easy Ways to Schedule: Walk-In Scheduling Call in scheduling Mychart Sign-up: [email or web address]            Colin Benton R., DO

## 2016-10-27 NOTE — Progress Notes (Signed)
Pre visit review using our clinic review tool, if applicable. No additional management support is needed unless otherwise documented below in the visit note. 

## 2016-11-15 ENCOUNTER — Ambulatory Visit (INDEPENDENT_AMBULATORY_CARE_PROVIDER_SITE_OTHER): Payer: PRIVATE HEALTH INSURANCE | Admitting: Family Medicine

## 2016-11-15 ENCOUNTER — Encounter: Payer: Self-pay | Admitting: Family Medicine

## 2016-11-15 VITALS — BP 102/78 | HR 99 | Temp 98.2°F | Ht 65.0 in | Wt 236.2 lb

## 2016-11-15 DIAGNOSIS — R21 Rash and other nonspecific skin eruption: Secondary | ICD-10-CM | POA: Diagnosis not present

## 2016-11-15 DIAGNOSIS — Z6839 Body mass index (BMI) 39.0-39.9, adult: Secondary | ICD-10-CM

## 2016-11-15 NOTE — Progress Notes (Signed)
Pre visit review using our clinic review tool, if applicable. No additional management support is needed unless otherwise documented below in the visit note. 

## 2016-11-15 NOTE — Progress Notes (Signed)
HPI:  Acute visit for R axillary rash. Much much better. Very minimal hyperpigmentation of skin in this area. O/w all symptoms resolved. Working on diet and exercise for wt reduction. Down from 243 to 236 since last visit!  ROS: See pertinent positives and negatives per HPI.  Past Medical History:  Diagnosis Date  . Arthritis   . Cystocele   . Depression   . Dysplasia of skin   . GERD (gastroesophageal reflux disease)   . Migraine headache with aura 2012   hx numb face with migraine  . Obesity   . Osteoarthritis of both knees   . Tubular adenoma 05/12/2009    Past Surgical History:  Procedure Laterality Date  . ANTERIOR AND POSTERIOR REPAIR N/A 09/22/2015   Procedure: ANTERIOR (CYSTOCELE) AND POSTERIOR REPAIR (RECTOCELE) ;  Surgeon: Nunzio Cobbs, MD;  Location: [facility];  Service: Gynecology;  Laterality: N/A;  . BILATERAL SALPINGECTOMY Bilateral 01/12/2015   Procedure: BILATERAL SALPINGECTOMY;  Surgeon: Megan Salon, MD;  Location: [facility];  Service: Gynecology;  Laterality: Bilateral;  . BLADDER SUSPENSION N/A 09/22/2015   Procedure: TRANSVAGINAL TAPE (TVT) PROCEDURE;  Surgeon: Nunzio Cobbs, MD;  Location: [facility];  Service: Gynecology;  Laterality: N/A;  . CHOLECYSTECTOMY  2008  . COLONOSCOPY    . CYSTOSCOPY N/A 01/12/2015   Procedure: CYSTOSCOPY;  Surgeon: Megan Salon, MD;  Location: [facility];  Service: Gynecology;  Laterality: N/A;  . CYSTOSCOPY N/A 09/22/2015   Procedure: CYSTOSCOPY;  Surgeon: Nunzio Cobbs, MD;  Location: [facility];  Service: Gynecology;  Laterality: N/A;  . endometrial biopsy     neg per patient  . LEG SKIN LESION  BIOPSY / EXCISION Left 01/20/2015   [facility]-moderate dysplasia per patient  . ORIF FINGER FRACTURE    . ROBOTIC ASSISTED TOTAL HYSTERECTOMY N/A 01/12/2015   Procedure: ROBOTIC ASSISTED TOTAL HYSTERECTOMY;  Surgeon: Megan Salon, MD;  Location: [facility];  Service: Gynecology;  Laterality: N/A;  . TUBAL  LIGATION    . uterine ablation  2/13    Family History  Problem Relation Age of Onset  . Arthritis Mother   . Hypertension Mother   . Hyperlipidemia Mother   . Stroke Mother   . Arthritis Father   . Hyperlipidemia Father   . Hypertension Father   . Diabetes Father   . Melanoma Maternal Aunt   . Melanoma Paternal Uncle   . Arthritis Maternal Grandmother   . Alcohol abuse Maternal Grandfather   . Squamous cell carcinoma Maternal Grandfather   . Arthritis Paternal Grandmother   . Melanoma Paternal Grandmother   . Alcohol abuse Paternal Grandfather   . Squamous cell carcinoma Maternal Aunt   . Colon cancer Paternal Uncle   . Liver cancer Paternal Uncle     Social History   Social History  . Marital status: Married    Spouse name: N/A  . Number of children: N/A  . Years of education: N/A   Social History Main Topics  . Smoking status: Former Smoker    Quit date: 09/22/2000  . Smokeless tobacco: Never Used  . Alcohol use 0.0 - 0.6 oz/week  . Drug use: No  . Sexual activity: Yes    Partners: Male    Birth control/ protection: Surgical     Comment: BTL/Hyst   Other Topics Concern  . 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  Dexmethylphenidate HCl 30 MG CP24, Take 1 capsule by mouth daily., Disp: , Rfl:  .  lamoTRIgine (LAMICTAL) 150 MG tablet, Take 1 tablet by mouth daily., Disp: , Rfl:  .  mupirocin ointment (BACTROBAN) 2 %, Place 1 application into the nose 2 (two) times daily., Disp: 22 g, Rfl: 0  Current Facility-Administered Medications:  .  0.9 %  sodium chloride infusion, 500 mL, Intravenous, Continuous, Kavitha Nandigam V, MD  EXAM:  Vitals:   11/15/16 1554  BP: 102/78  Pulse: 99  Temp: 98.2 F (36.8 C)    Body mass index is 39.31  kg/m.  GENERAL: vitals reviewed and listed above, alert, oriented, appears well hydrated and in no acute distress  HEENT: atraumatic, conjunttiva clear, no obvious abnormalities on inspection of external nose and ears  NECK: no obvious masses on inspection  SKIN: small patch of mildly hyperpigmented skin R axillary region  MS: moves all extremities without noticeable abnormality  PSYCH: pleasant and cooperative, no obvious depression or anxiety  ASSESSMENT AND PLAN:  Discussed the following assessment and plan:  Rash  BMI 39.0-39.9,adult  -rash resolved, hyperpigmentation likely post inflammatory - monitor, she has derm follow up planned for regular skin check and will have them check, o/w follow up if persists or worsens -congratulated on weight reduction! -Patient advised to return or notify a doctor immediately if symptoms worsen or persist or new concerns arise.  There are no Patient Instructions on file for this visit.  Colin Benton R., DO
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
## 2017-05-11 ENCOUNTER — Encounter: Payer: Self-pay | Admitting: Family Medicine

## 2017-05-26 ENCOUNTER — Ambulatory Visit (INDEPENDENT_AMBULATORY_CARE_PROVIDER_SITE_OTHER): Payer: No Typology Code available for payment source | Admitting: Obstetrics & Gynecology

## 2017-05-26 VITALS — BP 112/70 | HR 106 | Resp 16 | Ht 65.0 in | Wt 225.0 lb

## 2017-05-26 DIAGNOSIS — Z01419 Encounter for gynecological examination (general) (routine) without abnormal findings: Secondary | ICD-10-CM

## 2017-05-26 DIAGNOSIS — Z8349 Family history of other endocrine, nutritional and metabolic diseases: Secondary | ICD-10-CM | POA: Diagnosis not present

## 2017-05-26 NOTE — Progress Notes (Signed)
41 y.o. [id number] MarriedCaucasianF here for annual exam.  Doing well.  Kids are doing well.  Really busy at work.  Denies vaginal bleeding.  Not having any leakage.  Constipation is much better.  Reports she's just learned about hemochromatosis in family and several individuals with cirrhosis and liver failure that was attributed to alcohol but may not have been.  Would like testing.  Has lost about #25.  Patient's last menstrual period was 12/24/2014.          Sexually active: Yes.    The current method of family planning is status post hysterectomy.    Exercising: No.  The patient does not participate in regular exercise at present. Smoker:  no  Health Maintenance: Pap:  10/23/14 Neg. HR HPV:neg  History of abnormal Pap:  Yes, > 10 years ago MMG:  09/15/16 BIRADS1:neg  Colonoscopy:  09/13/16 Polyps. F/u 10 years  BMD:   Never TDaP:  2013 Pneumonia vaccine(s):  N/A Zostavax:   N/A Hep C testing: N/A Screening Labs: will discuss    reports that she quit smoking about 16 years ago. She has never used smokeless tobacco. She reports that she drinks alcohol. She reports that she does not use drugs.  Past Medical History:  Diagnosis Date  . Arthritis   . Cystocele   . Depression   . Dysplasia of skin   . GERD (gastroesophageal reflux disease)   . Migraine headache with aura 2012   hx numb face with migraine  . Obesity   . Osteoarthritis of both knees   . Tubular adenoma 05/12/2009    Past Surgical History:  Procedure Laterality Date  . ANTERIOR AND POSTERIOR REPAIR N/A 09/22/2015   Procedure: ANTERIOR (CYSTOCELE) AND POSTERIOR REPAIR (RECTOCELE) ;  Surgeon: Nunzio Cobbs, MD;  Location: [facility];  Service: Gynecology;  Laterality: N/A;  . BILATERAL SALPINGECTOMY Bilateral 01/12/2015   Procedure: BILATERAL SALPINGECTOMY;  Surgeon: Megan Salon, MD;  Location: [facility];  Service: Gynecology;  Laterality: Bilateral;  . BLADDER SUSPENSION N/A 09/22/2015   Procedure:  TRANSVAGINAL TAPE (TVT) PROCEDURE;  Surgeon: Nunzio Cobbs, MD;  Location: [facility];  Service: Gynecology;  Laterality: N/A;  . CHOLECYSTECTOMY  2008  . COLONOSCOPY    . CYSTOSCOPY N/A 01/12/2015   Procedure: CYSTOSCOPY;  Surgeon: Megan Salon, MD;  Location: [facility];  Service: Gynecology;  Laterality: N/A;  . CYSTOSCOPY N/A 09/22/2015   Procedure: CYSTOSCOPY;  Surgeon: Nunzio Cobbs, MD;  Location: [facility];  Service: Gynecology;  Laterality: N/A;  . endometrial biopsy     neg per patient  . LEG SKIN LESION  BIOPSY / EXCISION Left 01/20/2015   [facility]-moderate dysplasia per patient  . ORIF FINGER FRACTURE    . ROBOTIC ASSISTED TOTAL HYSTERECTOMY N/A 01/12/2015   Procedure: ROBOTIC ASSISTED TOTAL HYSTERECTOMY;  Surgeon: Megan Salon, MD;  Location: [facility];  Service: Gynecology;  Laterality: N/A;  . TUBAL LIGATION    . uterine ablation  2/13    Current Outpatient Prescriptions  Medication Sig Dispense Refill  . lamoTRIgine (LAMICTAL) 100 MG tablet Take 1 tablet by mouth daily.    . methylphenidate (RITALIN) 10 MG tablet Take 10 mg by mouth 3 (three) times daily with meals.     No current facility-administered medications for this visit.     Family History  Problem Relation Age of Onset  . Arthritis Mother   . Hypertension Mother   . Hyperlipidemia Mother   .  Stroke Mother   . Arthritis Father   . Hyperlipidemia Father   . Hypertension Father   . Diabetes Father   . Melanoma Maternal Aunt   . Melanoma Paternal Uncle   . Arthritis Maternal Grandmother   . Alcohol abuse Maternal Grandfather   . Squamous cell carcinoma Maternal Grandfather   . Arthritis Paternal Grandmother   . Melanoma Paternal Grandmother   . Alcohol abuse Paternal Grandfather   . Squamous cell carcinoma Maternal Aunt   . Colon cancer Paternal Uncle   . Liver cancer Paternal Uncle     ROS:  Pertinent items are noted in HPI.  Otherwise, a comprehensive ROS was  negative.  Exam:   BP 112/70 (BP Location: Right Arm, Patient Position: Sitting, Cuff Size: Large)   Pulse (!) 106   Resp 16   Ht 5\' 5"  (1.651 m)   Wt 225 lb (102.1 kg)   LMP 12/24/2014   BMI 37.44 kg/m   Weight change: -20 #    Height: 5\' 5"  (165.1 cm)  Ht Readings from Last 3 Encounters:  05/26/17 5\' 5"  (1.651 m)  11/15/16 5\' 5"  (1.651 m)  10/27/16 5\' 5"  (1.651 m)    General appearance: alert, cooperative and appears stated age Head: Normocephalic, without obvious abnormality, atraumatic Neck: no adenopathy, supple, symmetrical, trachea midline and thyroid normal to inspection and palpation Lungs: clear to auscultation bilaterally Breasts: normal appearance, no masses or tenderness Heart: regular rate and rhythm Abdomen: soft, non-tender; bowel sounds normal; no masses,  no organomegaly Extremities: extremities normal, atraumatic, no cyanosis or edema Skin: Skin color, texture, turgor normal. No rashes or lesions Lymph nodes: Cervical, supraclavicular, and axillary nodes normal. No abnormal inguinal nodes palpated Neurologic: Grossly normal   Pelvic: External genitalia:  no lesions              Urethra:  normal appearing urethra with no masses, tenderness or lesions              Bartholins and Skenes: normal                 Vagina: normal appearing vagina with normal color and discharge, no lesions              Cervix: absent              Pap taken: No. Bimanual Exam:  Uterus:  uterus absent              Adnexa: no mass, fullness, tenderness               Rectovaginal: Confirms               Anus:  normal sphincter tone, no lesions  Chaperone was present for exam.  A:  Well Woman with normal exam Robotic TLH/bilateral salpingectomy 3/16 then A7P repair 1/17, doing well Family hx of hemochromatosis H/O migraines with aura, improved Family hx of melanoma  P:   Mammogram guidelines reviewed pap smear not indicated CBC, ferritin, iron panel Hemochromatosis DNA  testing obtained today return annually or prn

## 2017-05-28 ENCOUNTER — Encounter: Payer: Self-pay | Admitting: Obstetrics & Gynecology

## 2017-06-01 LAB — IRON,TIBC AND FERRITIN PANEL
Ferritin: 115 ng/mL (ref 15–150)
IRON SATURATION: 14 % — AB (ref 15–55)
IRON: 39 ug/dL (ref 27–159)
Total Iron Binding Capacity: 273 ug/dL (ref 250–450)
UIBC: 234 ug/dL (ref 131–425)

## 2017-06-01 LAB — CBC
HEMATOCRIT: 40.2 % (ref 34.0–46.6)
HEMOGLOBIN: 13.1 g/dL (ref 11.1–15.9)
MCH: 28.9 pg (ref 26.6–33.0)
MCHC: 32.6 g/dL (ref 31.5–35.7)
MCV: 89 fL (ref 79–97)
PLATELETS: 256 10*3/uL (ref 150–379)
RBC: 4.54 x10E6/uL (ref 3.77–5.28)
RDW: 13.5 % (ref 12.3–15.4)
WBC: 8.8 10*3/uL (ref 3.4–10.8)

## 2017-06-01 LAB — HEMOCHROMATOSIS DNA-PCR(C282Y,H63D)

## 2017-08-31 ENCOUNTER — Encounter: Payer: Self-pay | Admitting: Family Medicine

## 2017-10-05 ENCOUNTER — Ambulatory Visit (INDEPENDENT_AMBULATORY_CARE_PROVIDER_SITE_OTHER): Payer: PRIVATE HEALTH INSURANCE | Admitting: Family Medicine

## 2017-10-05 ENCOUNTER — Encounter: Payer: Self-pay | Admitting: Family Medicine

## 2017-10-05 VITALS — BP 120/72 | HR 95 | Temp 97.7°F | Ht 65.5 in | Wt 232.1 lb

## 2017-10-05 DIAGNOSIS — Z1589 Genetic susceptibility to other disease: Secondary | ICD-10-CM | POA: Diagnosis not present

## 2017-10-05 DIAGNOSIS — Z0000 Encounter for general adult medical examination without abnormal findings: Secondary | ICD-10-CM

## 2017-10-05 DIAGNOSIS — Z6838 Body mass index (BMI) 38.0-38.9, adult: Secondary | ICD-10-CM

## 2017-10-05 DIAGNOSIS — M659 Synovitis and tenosynovitis, unspecified: Secondary | ICD-10-CM | POA: Diagnosis not present

## 2017-10-05 DIAGNOSIS — M79643 Pain in unspecified hand: Secondary | ICD-10-CM | POA: Diagnosis not present

## 2017-10-05 DIAGNOSIS — F3342 Major depressive disorder, recurrent, in full remission: Secondary | ICD-10-CM

## 2017-10-05 DIAGNOSIS — Z6841 Body Mass Index (BMI) 40.0 and over, adult: Secondary | ICD-10-CM

## 2017-10-05 HISTORY — DX: Genetic susceptibility to other disease: Z15.89

## 2017-10-05 NOTE — Progress Notes (Signed)
HPI:  Here for CPE:  -Concerns and/or follow up today:  She has a past medical history of obesity, GERD, cardiomegaly, atypical nevus, depression and migraines.  She sees psychiatrist for management of the depression. Reports mood has been good. She sees Dr. Toy Care on a regular basis.  She has had an evaluation in the past cardiology regarding the heart.    Uses only over-the-counter medicines for the migraines. She worked on making better choices with her diet and lost about 30 pounds last year.  She gained some back over the holiday.  Plans to continue a healthy diet and work on adding some exercise as she currently gets no regular exercise.  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  She has some pain and will by IP joints the lateral fingers.  Sometimes these fingers will catch and stick. Will be seeing dermatology.  Desires removal skin lesion under the R chin.  Has family history of hemochromatosis gene mutation.  She had testing and has minor abnormality that is not usually associated with systemic illness.  She had iron testing with her gynecologist which was normal.  -Diet: variety of foods, balance and well rounded, larger portion sizes -Exercise: no regular exercise -Taking folic acid, vitamin D or calcium: no -Diabetes and Dyslipidemia Screening: Not fasting today, prefers to return for fasting check of her lipids and diabetes screening labs, these are required a biometric -Vaccines: see vaccine section EPIC -pap history: Sees gynecologist for GYN exams -FDLMP: see nursing notes -sexual activity: yes, female partner, no new partners -wants STI testing (Hep C if born [birth year]-65): no -FH breast, colon or ovarian ca: see FH Last mammogram: Sees gynecologist for women's health  last colon cancer screening: Not applicable Breast Ca Risk Assessment: see family history and pt history DEXA (>/= 65): Not applicable  -Alcohol, Tobacco, drug use: see social history  Review of Systems - no fevers,  unintentional weight loss, vision loss, hearing loss, chest pain, sob, hemoptysis, melena, hematochezia, hematuria, genital discharge, changing or concerning skin lesions, bleeding, bruising, loc, thoughts of self harm or SI  Past Medical History:  Diagnosis Date  . Arthritis   . Cystocele   . Depression   . Dysplasia of skin   . 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 Results for S65C were negative. The  mutations analyzed by [facility] are most common in the Caucasian  population. Although some patients with this genotype experience  biochemically defined abnormalities of iron overload, the penetrance  for clinical symptoms, such as cirrhosis, cardiomyopa  . GERD (gastroesophageal reflux disease)   . Migraine headache with aura 2012   hx numb face with migraine  . Obesity   . Osteoarthritis of both knees   . Tubular adenoma 05/12/2009    Past Surgical History:  Procedure Laterality Date  . ANTERIOR AND POSTERIOR REPAIR N/A 09/22/2015   Procedure: ANTERIOR (CYSTOCELE) AND POSTERIOR REPAIR (RECTOCELE) ;  Surgeon: Nunzio Cobbs, MD;  Location: [facility];  Service: Gynecology;  Laterality: N/A;  . BILATERAL SALPINGECTOMY Bilateral 01/12/2015   Procedure: BILATERAL SALPINGECTOMY;  Surgeon: Megan Salon, MD;  Location: [facility];  Service: Gynecology;  Laterality: Bilateral;  . BLADDER SUSPENSION N/A 09/22/2015   Procedure: TRANSVAGINAL TAPE (TVT) PROCEDURE;  Surgeon: Nunzio Cobbs, MD;  Location: [facility];  Service: Gynecology;  Laterality: N/A;  . CHOLECYSTECTOMY  2008  .  COLONOSCOPY    . CYSTOSCOPY N/A 01/12/2015   Procedure: CYSTOSCOPY;  Surgeon: Megan Salon, MD;  Location: [facility];  Service: Gynecology;  Laterality: N/A;  . CYSTOSCOPY N/A 09/22/2015   Procedure: CYSTOSCOPY;  Surgeon: Nunzio Cobbs, MD;  Location: [facility];  Service: Gynecology;  Laterality: N/A;  .  endometrial biopsy     neg per patient  . LEG SKIN LESION  BIOPSY / EXCISION Left 01/20/2015    Dermatology-moderate dysplasia per patient  . ORIF FINGER FRACTURE    . ROBOTIC ASSISTED TOTAL HYSTERECTOMY N/A 01/12/2015   Procedure: ROBOTIC ASSISTED TOTAL HYSTERECTOMY;  Surgeon: Megan Salon, MD;  Location: [facility];  Service: Gynecology;  Laterality: N/A;  . TUBAL LIGATION    . uterine ablation  2/13    Family History  Problem Relation Age of Onset  . Arthritis Mother   . Hypertension Mother   . Hyperlipidemia Mother   . Stroke Mother   . Arthritis Father   . Hyperlipidemia Father   . Hypertension Father   . Diabetes Father   . Melanoma Maternal Aunt   . Melanoma Paternal Uncle   . Arthritis Maternal Grandmother   . Alcohol abuse Maternal Grandfather   . Squamous cell carcinoma Maternal Grandfather   . Arthritis Paternal Grandmother   . Melanoma Paternal Grandmother   . Alcohol abuse Paternal Grandfather   . Squamous cell carcinoma Maternal Aunt   . Colon cancer Paternal Uncle   . Liver cancer Paternal Uncle     Social History   Socioeconomic History  . Marital status: Married    Spouse name: None  . Number of children: None  . Years of education: None  . Highest education level: None  Social Needs  . Financial resource strain: None  . Food insecurity - worry: None  . Food insecurity - inability: None  . Transportation needs - medical: None  . Transportation needs - non-medical: None  Occupational History  . None  Tobacco Use  . Smoking status: Former Smoker    Last attempt to quit: 09/22/2000    Years since quitting: 17.0  . Smokeless tobacco: Never Used  Substance and Sexual Activity  . Alcohol use: Yes    Alcohol/week: 0.0 - 0.6 oz  . Drug use: No  . Sexual activity: Yes    Partners: Male    Birth control/protection: Surgical    Comment: BTL/Hyst  Other Topics Concern  . None  Social History Narrative   Work or School: NP heaptology      Home  Situation: lives with husband and 2 children      Spiritual Beliefs: none      Lifestyle: elliptical; working on diet              Current Outpatient Medications:  .  lamoTRIgine (LAMICTAL) 100 MG tablet, Take 1 tablet by mouth daily., Disp: , Rfl:  .  methylphenidate (RITALIN) 10 MG tablet, Take 10 mg by mouth 3 (three) times daily with meals., Disp: , Rfl:   EXAM:  Vitals:   10/05/17 1443  BP: 120/72  Pulse: 95  Temp: 97.7 F (36.5 C)  Body mass index is 38.04 kg/m.  GENERAL: vitals reviewed and listed below, alert, oriented, appears well hydrated and in no acute distress  HEENT: head atraumatic, PERRLA, normal appearance of eyes, ears, nose and mouth. moist mucus membranes.  NECK: supple, no masses or lymphadenopathy  LUNGS: clear to auscultation bilaterally, no rales, rhonchi or wheeze  CV: HRRR, no peripheral edema or cyanosis, normal pedal pulses  ABDOMEN: bowel sounds normal, soft, non tender to palpation, no masses, no rebound or guarding  GU/BREAST: declined, does with gyn  SKIN: no rash or abnormal lesions on exposed portion, small 65m dome shaped lesion flesh colored under chin, plans to see dermatology  MS: normal gait, moves all extremities normally, no redness, swelling or deformity of the joints in the fingers, she is mildly hyperreflexive does have some catching particularly of the 4-5th digits of the L hand  NEURO: normal gait, speech and thought processing grossly intact, muscle tone grossly intact throughout  PSYCH: normal affect, pleasant and cooperative  ASSESSMENT AND PLAN:  Discussed the following assessment and plan:  PREVENTIVE EXAM: -Discussed and advised all [place] health task force level A and B recommendations for age, sex and risks. -Advised at least 150 minutes of exercise per week and a healthy diet with avoidance of (less then 1 serving per week) processed foods, white starches, red meat, fast foods and sweets and  consisting of: * 5-9 servings of fresh fruits and vegetables (not corn or potatoes) *nuts and seeds, beans *olives and olive oil *lean meats such as fish and white chicken  *whole grains -labs, studies and vaccines per orders this encounter  Pain of hand, unspecified laterality - Plan: Ambulatory referral to Sports Medicine - query tenosynovitis or hypermobility -she is concerned because she uses her hands a lot at work  BMI 38.0-38.9,adult -Lifestyle recommendations, encouraged her to increase her aerobic activity  Recurrent major depressive disorder, in full remission [place] -Continue care with psychiatry  Gene mutation -Reviewed labs done with her gynecologist -this type of mutation not usually associated with systemic illness, her iron studies were good  Patient advised to return to clinic immediately if symptoms worsen or persist or new concerns.  Patient Instructions  BEFORE YOU LEAVE: -schedule lab visit -follow up: yearly for physical  See dermatologist about the nodule under the chin  -We placed a referral for you as discussed for the hand. It usually takes about 1-2 weeks to process and schedule this referral. If you have not heard from u[place]arding this appointment in 2 weeks please contact our office.   We have ordered labs or studies at this visit. It can take up to 1-2 weeks for results and processing. IF results require follow up or explanation, we will call you with instructions. Clinically stable results will be [facility] your [facility] If you have not heard from u[place]oreaor cannot find your results in [facility] 2 weeks please contact our office at [phone]  If you are not yet signed up for [facility] please consider signing up.   Preventive Care 40-64 Years, Female Preventive care refers to lifestyle choices and visits with your health care provider that can promote health and wellness. What does preventive care include?  A yearly physical exam. This is also  called an annual well check.  Dental exams once or twice a year.  Routine eye exams. Ask your health care provider how often you should have your eyes checked.  Personal lifestyle choices, including: ? Daily care of your teeth and gums. ? Regular physical activity. ? Eating a healthy diet. ? Avoiding tobacco and drug use. ? Limiting alcohol use. ? Practicing safe sex. ? Taking low-dose aspirin daily starting at age 43 ? Taking vitamin and mineral supplements as recommended by your health care provider. What happens during an annual well check? The services  and screenings done by your health care provider during your annual well check will depend on your age, overall health, lifestyle risk factors, and family history of disease. Counseling Your health care provider may ask you questions about your:  Alcohol use.  Tobacco use.  Drug use.  Emotional well-being.  Home and relationship well-being.  Sexual activity.  Eating habits.  Work and work Statistician.  Method of birth control.  Menstrual cycle.  Pregnancy history.  Screening You may have the following tests or measurements:  Height, weight, and BMI.  Blood pressure.  Lipid and cholesterol levels. These may be checked every 5 years, or more frequently if you are over 60 years old.  Skin check.  Lung cancer screening. You may have this screening every year starting at age 31 if you have a 30-pack-year history of smoking and currently smoke or have quit within the past 15 years.  Fecal occult blood test (FOBT) of the stool. You may have this test every year starting at age 25.  Flexible sigmoidoscopy or colonoscopy. You may have a sigmoidoscopy every 5 years or a colonoscopy every 10 years starting at age 39.  Hepatitis C blood test.  Hepatitis B blood test.  Sexually transmitted disease (STD) testing.  Diabetes screening. This is done by checking your blood sugar (glucose) after you have not eaten for  a while (fasting). You may have this done every 1-3 years.  Mammogram. This may be done every 1-2 years. Talk to your health care provider about when you should start having regular mammograms. This may depend on whether you have a family history of breast cancer.  BRCA-related cancer screening. This may be done if you have a family history of breast, ovarian, tubal, or peritoneal cancers.  Pelvic exam and Pap test. This may be done every 3 years starting at age 58. Starting at age 28, this may be done every 5 years if you have a Pap test in combination with an HPV test.  Bone density scan. This is done to screen for osteoporosis. You may have this scan if you are at high risk for osteoporosis.  Discuss your test results, treatment options, and if necessary, the need for more tests with your health care provider. Vaccines Your health care provider may recommend certain vaccines, such as:  Influenza vaccine. This is recommended every year.  Tetanus, diphtheria, and acellular pertussis (Tdap, Td) vaccine. You may need a Td booster every 10 years.  Varicella vaccine. You may need this if you have not been vaccinated.  Zoster vaccine. You may need this after age 76.  Measles, mumps, and rubella (MMR) vaccine. You may need at least one dose of MMR if you were born in 1957 or later. You may also need a second dose.  Pneumococcal 13-valent conjugate (PCV13) vaccine. You may need this if you have certain conditions and were not previously vaccinated.  Pneumococcal polysaccharide (PPSV23) vaccine. You may need one or two doses if you smoke cigarettes or if you have certain conditions.  Meningococcal vaccine. You may need this if you have certain conditions.  Hepatitis A vaccine. You may need this if you have certain conditions or if you travel or work in places where you may be exposed to hepatitis A.  Hepatitis B vaccine. You may need this if you have certain conditions or if you travel or  work in places where you may be exposed to hepatitis B.  Haemophilus influenzae type b (Hib) vaccine. You may need this  if you have certain conditions.  Talk to your health care provider about which screenings and vaccines you need and how often you need them. This information is not intended to replace advice given to you by your health care provider. Make sure you discuss any questions you have with your health care provider. Document Released: 09/04/2015 Document Revised: 04/27/2016 Document Reviewed: 06/09/2015 Elsevier Interactive Patient Education  2018 [employer].           No Follow-up on file.  Lucretia Kern, DO

## 2017-10-05 NOTE — Patient Instructions (Signed)
BEFORE YOU LEAVE: -schedule lab visit -follow up: yearly for physical  See dermatologist about the nodule under the chin  -We placed a referral for you as discussed for the hand. It usually takes about 1-2 weeks to process and schedule this referral. If you have not heard from [place] regarding this appointment in 2 weeks please contact our office.   We have ordered labs or studies at this visit. It can take up to 1-2 weeks for results and processing. IF results require follow up or explanation, we will call you with instructions. Clinically stable results will be released to your [facility]. If you have not heard from [place] or cannot find your results in [facility] in 2 weeks please contact our office at [phone].  If you are not yet signed up for [facility], please consider signing up.   Preventive Care 40-64 Years, Female Preventive care refers to lifestyle choices and visits with your health care provider that can promote health and wellness. What does preventive care include?  A yearly physical exam. This is also called an annual well check.  Dental exams once or twice a year.  Routine eye exams. Ask your health care provider how often you should have your eyes checked.  Personal lifestyle choices, including: ? Daily care of your teeth and gums. ? Regular physical activity. ? Eating a healthy diet. ? Avoiding tobacco and drug use. ? Limiting alcohol use. ? Practicing safe sex. ? Taking low-dose aspirin daily starting at age 25. ? Taking vitamin and mineral supplements as recommended by your health care provider. What happens during an annual well check? The services and screenings done by your health care provider during your annual well check will depend on your age, overall health, lifestyle risk factors, and family history of disease. Counseling Your health care provider may ask you questions about your:  Alcohol use.  Tobacco use.  Drug use.  Emotional well-being.  Home and  relationship well-being.  Sexual activity.  Eating habits.  Work and work Statistician.  Method of birth control.  Menstrual cycle.  Pregnancy history.  Screening You may have the following tests or measurements:  Height, weight, and BMI.  Blood pressure.  Lipid and cholesterol levels. These may be checked every 5 years, or more frequently if you are over 74 years old.  Skin check.  Lung cancer screening. You may have this screening every year starting at age 74 if you have a 30-pack-year history of smoking and currently smoke or have quit within the past 15 years.  Fecal occult blood test (FOBT) of the stool. You may have this test every year starting at age 53.  Flexible sigmoidoscopy or colonoscopy. You may have a sigmoidoscopy every 5 years or a colonoscopy every 10 years starting at age 72.  Hepatitis C blood test.  Hepatitis B blood test.  Sexually transmitted disease (STD) testing.  Diabetes screening. This is done by checking your blood sugar (glucose) after you have not eaten for a while (fasting). You may have this done every 1-3 years.  Mammogram. This may be done every 1-2 years. Talk to your health care provider about when you should start having regular mammograms. This may depend on whether you have a family history of breast cancer.  BRCA-related cancer screening. This may be done if you have a family history of breast, ovarian, tubal, or peritoneal cancers.  Pelvic exam and Pap test. This may be done every 3 years starting at age 22. Starting at age 30,  this may be done every 5 years if you have a Pap test in combination with an HPV test.  Bone density scan. This is done to screen for osteoporosis. You may have this scan if you are at high risk for osteoporosis.  Discuss your test results, treatment options, and if necessary, the need for more tests with your health care provider. Vaccines Your health care provider may recommend certain vaccines, such  as:  Influenza vaccine. This is recommended every year.  Tetanus, diphtheria, and acellular pertussis (Tdap, Td) vaccine. You may need a Td booster every 10 years.  Varicella vaccine. You may need this if you have not been vaccinated.  Zoster vaccine. You may need this after age 53.  Measles, mumps, and rubella (MMR) vaccine. You may need at least one dose of MMR if you were born in 1957 or later. You may also need a second dose.  Pneumococcal 13-valent conjugate (PCV13) vaccine. You may need this if you have certain conditions and were not previously vaccinated.  Pneumococcal polysaccharide (PPSV23) vaccine. You may need one or two doses if you smoke cigarettes or if you have certain conditions.  Meningococcal vaccine. You may need this if you have certain conditions.  Hepatitis A vaccine. You may need this if you have certain conditions or if you travel or work in places where you may be exposed to hepatitis A.  Hepatitis B vaccine. You may need this if you have certain conditions or if you travel or work in places where you may be exposed to hepatitis B.  Haemophilus influenzae type b (Hib) vaccine. You may need this if you have certain conditions.  Talk to your health care provider about which screenings and vaccines you need and how often you need them. This information is not intended to replace advice given to you by your health care provider. Make sure you discuss any questions you have with your health care provider. Document Released: 09/04/2015 Document Revised: 04/27/2016 Document Reviewed: 06/09/2015 Elsevier Interactive Patient Education  [employer].

## 2017-10-10 ENCOUNTER — Other Ambulatory Visit (INDEPENDENT_AMBULATORY_CARE_PROVIDER_SITE_OTHER): Payer: PRIVATE HEALTH INSURANCE

## 2017-10-10 DIAGNOSIS — Z0000 Encounter for general adult medical examination without abnormal findings: Secondary | ICD-10-CM

## 2017-10-10 LAB — LIPID PANEL
CHOL/HDL RATIO: 4
Cholesterol: 180 mg/dL (ref 0–200)
HDL: 43.8 mg/dL (ref >39.00)
LDL CALC: 114 mg/dL — AB (ref 0–99)
NonHDL: 136.61
TRIGLYCERIDES: 111 mg/dL (ref 0.0–149.0)
VLDL: 22.2 mg/dL (ref 0.0–40.0)

## 2017-10-10 LAB — HEMOGLOBIN A1C: Hgb A1c MFr Bld: 5.8 % (ref 4.6–6.5)

## 2017-10-13 ENCOUNTER — Ambulatory Visit: Payer: Self-pay

## 2017-10-13 ENCOUNTER — Ambulatory Visit (INDEPENDENT_AMBULATORY_CARE_PROVIDER_SITE_OTHER): Payer: PRIVATE HEALTH INSURANCE | Admitting: Sports Medicine

## 2017-10-13 VITALS — BP 120/74 | HR 95 | Wt 234.2 lb

## 2017-10-13 DIAGNOSIS — M79642 Pain in left hand: Secondary | ICD-10-CM | POA: Diagnosis not present

## 2017-10-13 DIAGNOSIS — M2550 Pain in unspecified joint: Secondary | ICD-10-CM | POA: Diagnosis not present

## 2017-10-13 DIAGNOSIS — G5603 Carpal tunnel syndrome, bilateral upper limbs: Secondary | ICD-10-CM

## 2017-10-13 DIAGNOSIS — M79641 Pain in right hand: Secondary | ICD-10-CM | POA: Insufficient documentation

## 2017-10-13 MED ORDER — DICLOFENAC SODIUM 2 % TD SOLN: 1.0000 "application " | Freq: Two times a day (BID) | TRANSDERMAL | 0 refills | Status: AC

## 2017-10-13 MED ORDER — DICLOFENAC SODIUM 2 % TD SOLN: 1.0000 "application " | Freq: Two times a day (BID) | TRANSDERMAL | 2 refills | Status: DC

## 2017-10-13 NOTE — Patient Instructions (Signed)
[facility] instructions for Duexis, Pennsaid and Vimovo:  Your prescription will be filled through a mail order pharmacy.  It is typically [facility] but may vary depending on where you live.  You will receive a phone call from them which will typically come from a [phone] phone number.  You must speak directly to them to have this medication filled.  When the pharmacy calls, they will need your mailing address (for overnight shipment of the medication) andy they will need payment information if you have a copay (typically no more than $10). If you have not heard from them 2-3 days after your appointment with Dr. Rigby, contact us at the office ([phone]) or through MyChart so we can reach back out to the pharmacy.  

## 2017-10-13 NOTE — Assessment & Plan Note (Signed)
Question of hypermobility syndrome versus underlying.  We will plan to check some blood work on her today, have her start on topical Pennsaid to be used twice per day and have her use night splints for the underlying carpal tunnel that she has as well.  She is not having classic symptoms of trigger finger but is having more so a subluxation of her IP joints which may be related to an underlying hypermobility syndrome versus worsening OA.  We will plan to follow-up with her in 4 weeks and see how she is doing after she uses Pennsaid twice daily for 2 weeks.

## 2017-10-13 NOTE — Progress Notes (Signed)
Diane Scott. [place], [name] - 42 y.o. female MRN [id number]  Date of birth: 09-06-1975  Visit Date: 10/13/2017  PCP: Lucretia Kern, DO   Referred by: Lucretia Kern, DO   Scribe for today's visit: Lonell Grandchild, CMA      SUBJECTIVE:  Diane Scott is here for No chief complaint on file. Diane Scott  Referred by: Dr. Colin Benton  Her finger/hand pain symptoms INITIALLY:  Began six months and has gotten worse over time.  Described as moderate numbness in fingers, radiating to palms into wrist on both hands.  Worsened with typing or flexing wrist. Improved with resting.  Additional associated symptoms include: fingers locking on both hands.    ROS Denies night time disturbances. Denies fevers, chills, or night sweats. Denies unexplained weight loss. Denies personal history of cancer. Denies changes in bowel or bladder habits. Denies recent unreported falls. Denies new or worsening dyspnea or wheezing. Denies headaches or dizziness.  Reports numbness, tingling or weakness  In the extremities. Both hands  Denies dizziness or presyncopal episodes Denies lower extremity edema     HISTORY & PERTINENT PRIOR DATA:  Prior History reviewed and updated per electronic medical record.  Significant history, findings, studies and interim changes include:  reports that she quit smoking about 17 years ago. she has never used smokeless tobacco. Recent Labs    10/10/17 0807 10/13/17 1526  HGBA1C 5.8  --   LABURIC  --  6.5   No specialty comments available. Problem  Bilateral Hand Pain    OBJECTIVE:  VS:  HT:    WT:234 lb 3.2 oz (106.2 kg)  BMI:38.37    BP:120/74  HR:95bpm  TEMP: ( )  RESP:95 %   PHYSICAL EXAM: Constitutional: WDWN, Non-toxic appearing. Psychiatric: Alert & appropriately interactive.  Not depressed or anxious appearing. Respiratory: No increased work of breathing.  Trachea  Midline Eyes: Pupils are equal.  EOM intact without nystagmus.  No scleral icterus  NEUROVASCULAR exam: No clubbing or cyanosis appreciated No significant venous stasis changes Capillary Refill: normal, less than 2 seconds   Bilateral hand symptoms reveal a hypermobility of the fourth and fifth fingers.  She has postsurgical scarring over the right fourth finger.  She has good flexion extension.  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  There is increased laxity at the PIP with anterior posterior varus and valgus straining.    ASSESSMENT & PLAN:   1. Bilateral hand pain   2. Bilateral carpal tunnel syndrome   3. Polyarthralgia    ++++++++++++++++++++++++++++++++++++++++++++ Orders & Meds:  Orders Placed This Encounter  Procedures  . [place] MSK POCT ULTRASOUND  . CBC with Differential/Platelet  . Comprehensive metabolic panel  . VITAMIN D 25 Hydroxy (Vit-D Deficiency, Fractures)  . Uric acid  . Sedimentation rate  . C-reactive protein  . Cyclic citrul peptide antibody, IgG  . Rheumatoid factor  . ANA    Meds ordered this encounter  Medications  . Diclofenac Sodium (PENNSAID) 2 % SOLN    Sig: Place 1 application onto the skin 2 (two) times daily.    Dispense:  112 g    Refill:  2    Home Phone      [phone] Mobile          [phone]   . Diclofenac Sodium (PENNSAID) 2 % SOLN    Sig: Place 1 application onto the skin 2 (two) times daily  for 1 day.    Dispense:  8 g    Refill:  0    ++++++++++++++++++++++++++++++++++++++++++++ PLAN:   Encounter Diagnoses  Name Primary?  . Bilateral hand pain Yes  . Bilateral carpal tunnel syndrome   . Polyarthralgia      Bilateral hand pain Question of hypermobility syndrome versus underlying.  We will plan to check some blood work on her today, have her start on topical Pennsaid to be used twice per day and have her use night splints for the underlying carpal tunnel that she has as well.  She is not having classic symptoms of  trigger finger but is having more so a subluxation of her IP joints which may be related to an underlying hypermobility syndrome versus worsening OA.  We will plan to follow-up with her in 4 weeks and see how she is doing after she uses Pennsaid twice daily for 2 weeks.   Follow-up: Return in about 4 weeks (around 11/10/2017).   Pertinent documentation may be included in additional procedure notes, imaging studies, problem based documentation and patient instructions. Please see these sections of the encounter for additional information regarding this visit. CMA/ATC served as Education administrator during this visit. History, Physical, and Plan performed by medical provider. Documentation and orders reviewed and attested to.      Diane Scott, [place] [facility] Physician

## 2017-10-16 LAB — CBC WITH DIFFERENTIAL/PLATELET
BASOS PCT: 0.5 %
Basophils Absolute: 56 cells/uL (ref 0–200)
EOS ABS: 157 {cells}/uL (ref 15–500)
EOS PCT: 1.4 %
HEMATOCRIT: 39.4 % (ref 35.0–45.0)
HEMOGLOBIN: 13.3 g/dL (ref 11.7–15.5)
LYMPHS ABS: 2957 {cells}/uL (ref 850–3900)
MCH: 28.7 pg (ref 27.0–33.0)
MCHC: 33.8 g/dL (ref 32.0–36.0)
MCV: 85.1 fL (ref 80.0–100.0)
MONOS PCT: 6 %
MPV: 10.8 fL (ref 7.5–12.5)
NEUTROS ABS: 7358 {cells}/uL (ref 1500–7800)
Neutrophils Relative %: 65.7 %
Platelets: 261 10*3/uL (ref 140–400)
RBC: 4.63 10*6/uL (ref 3.80–5.10)
RDW: 12.4 % (ref 11.0–15.0)
Total Lymphocyte: 26.4 %
WBC mixed population: 672 cells/uL (ref 200–950)
WBC: 11.2 10*3/uL — ABNORMAL HIGH (ref 3.8–10.8)

## 2017-10-16 LAB — URIC ACID: Uric Acid, Serum: 6.5 mg/dL (ref 2.5–7.0)

## 2017-10-16 LAB — COMPREHENSIVE METABOLIC PANEL
AG Ratio: 2 (calc) (ref 1.0–2.5)
ALBUMIN MSPROF: 4.5 g/dL (ref 3.6–5.1)
ALKALINE PHOSPHATASE (APISO): 57 U/L (ref 33–115)
ALT: 17 U/L (ref 6–29)
AST: 16 U/L (ref 10–30)
BILIRUBIN TOTAL: 0.3 mg/dL (ref 0.2–1.2)
BUN: 13 mg/dL (ref 7–25)
CHLORIDE: 104 mmol/L (ref 98–110)
CO2: 20 mmol/L (ref 20–32)
Calcium: 9.6 mg/dL (ref 8.6–10.2)
Creat: 0.82 mg/dL (ref 0.50–1.10)
GLOBULIN: 2.2 g/dL (ref 1.9–3.7)
Glucose, Bld: 89 mg/dL (ref 65–99)
POTASSIUM: 4.8 mmol/L (ref 3.5–5.3)
Sodium: 139 mmol/L (ref 135–146)
Total Protein: 6.7 g/dL (ref 6.1–8.1)

## 2017-10-16 LAB — CYCLIC CITRUL PEPTIDE ANTIBODY, IGG: Cyclic Citrullin Peptide Ab: <16 UNITS

## 2017-10-16 LAB — RHEUMATOID FACTOR

## 2017-10-16 LAB — ANA: ANA: NEGATIVE

## 2017-10-16 LAB — SEDIMENTATION RATE: Sed Rate: 17 mm/h (ref 0–20)

## 2017-10-16 LAB — C-REACTIVE PROTEIN: CRP: 5 mg/L (ref <8.0)

## 2017-10-16 LAB — VITAMIN D 25 HYDROXY (VIT D DEFICIENCY, FRACTURES): VIT D 25 HYDROXY: 15 ng/mL — AB (ref 30–100)

## 2017-10-20 ENCOUNTER — Encounter: Payer: Self-pay | Admitting: Sports Medicine

## 2017-10-20 MED ORDER — CHOLECALCIFEROL 1.25 MG (50000 UT) PO TABS: 1.0000 | ORAL_TABLET | ORAL | 0 refills | Status: DC

## 2017-10-20 NOTE — Progress Notes (Signed)
My chart message sent as below:  Yes we will follow have all the results.  I was waiting for all of them to be complete before sending to you.  Everything from the standpoint of rheumatologic issues looks good no evidence of significant inflammation.  You are markedly low in your vitamin D and that can cause some generalized muscle pain and aches.  I would like to start you on a supplement of 50,000 units once weekly of vitamin D3.  I would recommend rechecking this in 12 weeks.  I am glad to hear that you are doing slightly better and if there are any other issues in the meantime please let us know but I will plan on seeing him back as scheduled.

## 2017-11-10 ENCOUNTER — Ambulatory Visit: Payer: Self-pay

## 2017-11-10 ENCOUNTER — Encounter: Payer: Self-pay | Admitting: Sports Medicine

## 2017-11-10 ENCOUNTER — Ambulatory Visit (INDEPENDENT_AMBULATORY_CARE_PROVIDER_SITE_OTHER): Payer: PRIVATE HEALTH INSURANCE | Admitting: Sports Medicine

## 2017-11-10 VITALS — BP 112/86 | HR 93 | Ht 65.5 in | Wt 233.6 lb

## 2017-11-10 DIAGNOSIS — M79642 Pain in left hand: Principal | ICD-10-CM

## 2017-11-10 DIAGNOSIS — M659 Unspecified synovitis and tenosynovitis, unspecified site: Secondary | ICD-10-CM

## 2017-11-10 DIAGNOSIS — M2550 Pain in unspecified joint: Secondary | ICD-10-CM | POA: Diagnosis not present

## 2017-11-10 DIAGNOSIS — G5603 Carpal tunnel syndrome, bilateral upper limbs: Secondary | ICD-10-CM | POA: Diagnosis not present

## 2017-11-10 DIAGNOSIS — M65351 Trigger finger, right little finger: Secondary | ICD-10-CM | POA: Diagnosis not present

## 2017-11-10 DIAGNOSIS — M79641 Pain in right hand: Secondary | ICD-10-CM | POA: Diagnosis not present

## 2017-11-10 NOTE — Progress Notes (Signed)
Diane Scott - 42 y.o. female MRN [id number]  Date of birth: Jan 01, 1976  Scribe for today's visit: Josepha Pigg, CMA     SUBJECTIVE:  Diane Scott is here for Follow-up (bilateral hand pain) .  Referred by: Dr. Colin Benton 10/13/17: Her finger/hand pain symptoms INITIALLY:  Began six months and has gotten worse over time.  Described as moderate numbness in fingers, radiating to palms into wrist on both hands.  Worsened with typing or flexing wrist. Improved with resting.  Additional associated symptoms include: fingers locking on both hands.   11/10/17: Compared to the last office visit, her previously described symptoms are improving.  Current symptoms are moderate & are radiating to wrists and hands. She is still having a lot of pain in the fifth finger of R hand.  She has been wearing braces at night and it has been beneficial. If she wakes up at night she takes them off or she is unable to fall back asleep and the does noticed increased numbness when not wearing the brace at night. She was using Pennsaid but it caused her skin to dry so she stopped using it for a few days and then resumed and now hands are dry again.   ROS Reports night time disturbances if not wearing brace. Denies fevers, chills, or night sweats. Denies unexplained weight loss. Denies personal history of cancer. Denies changes in bowel or bladder habits. Denies recent unreported falls. Denies new or worsening dyspnea or wheezing. Denies headaches or dizziness.  Reports numbness, tingling or weakness  In the extremities.  Denies dizziness or presyncopal episodes Denies lower extremity edema    HISTORY & PERTINENT PRIOR DATA:  Prior History reviewed and updated per electronic medical record.  Significant/pertinent history, findings, studies include:  reports that she quit smoking about 17 years ago. She has never used smokeless tobacco. Recent Labs    10/10/17 0807 10/13/17 1526  HGBA1C 5.8  --    LABURIC  --  6.5   No specialty comments available. No problems updated.  OBJECTIVE:  VS:  HT:5' 5.5" (166.4 cm)   WT:233 lb 9.6 oz (106 kg)  BMI:38.27    BP:112/86  HR:93bpm  TEMP: ( )  RESP:98 %   PHYSICAL EXAM: Constitutional: WDWN, Non-toxic appearing. Psychiatric: Alert & appropriately interactive.  Not depressed or anxious appearing. Respiratory: No increased work of breathing.  Trachea Midline Eyes: Pupils are equal.  EOM intact without nystagmus.  No scleral icterus  Vascular Exam: warm to touch no edema  upper extremity neuro exam: unremarkable  MSK Exam: Hypermobility of the right hand with underlying triggering of the fifth finger.  She does have postsurgical changes of the fourth finger and a markedly short fourth metacarpal bilaterally.   ASSESSMENT & PLAN:   1. Bilateral hand pain   2. Bilateral carpal tunnel syndrome   3. Polyarthralgia   4. Tenosynovitis   5. Trigger little finger of right hand     PLAN: Trigger finger injected today.  She does have improvements in her general symptoms.  Likelihood of the postsurgical changes from her fourth finger contributed to the overuse of the fifth is high.  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  Follow-up: She will call us as needed     Please see additional documentation for Objective, Assessment  and Plan sections. Pertinent additional documentation may be included in corresponding procedure notes, imaging studies, problem based documentation and patient instructions. Please see these sections of the encounter for additional information regarding this visit.  CMA/ATC served as Education administrator during this visit. History, Physical, and Plan performed by medical provider. Documentation and orders  reviewed and attested to.      Gerda Diss, [place] [facility] Physician

## 2017-11-10 NOTE — Patient Instructions (Signed)

## 2017-11-10 NOTE — Progress Notes (Signed)
PROCEDURE NOTE:  Ultrasound Guided: Injection: Right 5th finger flexor tendon Images were obtained and interpreted by myself, Teresa Coombs, DO  Images have been saved and stored to PACS system. Images obtained on: GE S7 Ultrasound machine    ULTRASOUND FINDINGS:  Marked thickening and hypoechoic change around the A1 pulley with triggering appreciated.  She also has slight subluxation of the IP joint with only minimal degenerative change appreciated.  DESCRIPTION OF PROCEDURE:  The patient's clinical condition is marked by substantial pain and/or significant functional disability. Other conservative therapy has not provided relief, is contraindicated, or not appropriate. There is a reasonable likelihood that injection will significantly improve the patient's pain and/or functional impairment.   After discussing the risks, benefits and expected outcomes of the injection and all questions were reviewed and answered, the patient wished to undergo the above named procedure.  Verbal consent was obtained.  The ultrasound was used to identify the target structure and adjacent neurovascular structures. The skin was then prepped in sterile fashion and the target structure was injected under direct visualization using sterile technique as below:  PREP: Alcohol, Ethel Chloride and 1 cc 1% lidocaine on Insulin Needle APPROACH: direct, single injection, 25g 1.5 in. INJECTATE: 0.5 cc 1% lidocaine, 0.5 cc 0.5% Marcaine and 0.5 cc 40mg /mL DepoMedrol ASPIRATE: None DRESSING: Band-Aid  Post procedural instructions including recommending icing and warning signs for infection were reviewed.    This procedure was well tolerated and there were no complications.   IMPRESSION: Succesful Ultrasound Guided: Injection

## 2017-11-18 ENCOUNTER — Encounter: Payer: Self-pay | Admitting: Sports Medicine

## 2017-12-20 ENCOUNTER — Ambulatory Visit (INDEPENDENT_AMBULATORY_CARE_PROVIDER_SITE_OTHER): Payer: PRIVATE HEALTH INSURANCE | Admitting: Sports Medicine

## 2017-12-20 ENCOUNTER — Encounter: Payer: Self-pay | Admitting: Sports Medicine

## 2017-12-20 VITALS — BP 106/80 | HR 91 | Ht 65.5 in | Wt 235.4 lb

## 2017-12-20 DIAGNOSIS — M7711 Lateral epicondylitis, right elbow: Secondary | ICD-10-CM | POA: Diagnosis not present

## 2017-12-20 MED ORDER — NITROGLYCERIN 0.2 MG/HR TD PT24: MEDICATED_PATCH | TRANSDERMAL | 1 refills | Status: DC

## 2017-12-20 NOTE — Patient Instructions (Addendum)
Please perform the exercise program that we have prepared for you and gone over in detail on a daily basis.  In addition to the handout you were provided you can access your program through: [email or web address]   Your unique program code is: [email or web address]    Nitroglycerin Protocol   Apply 1/4 nitroglycerin patch to affected area daily.  Change position of patch within the affected area every 24 hours.  You may experience a headache during the first 1-2 weeks of using the patch, these should subside.  If you experience headaches after beginning nitroglycerin patch treatment, you may take your preferred over the counter pain reliever.  Another side effect of the nitroglycerin patch is skin irritation or rash related to patch adhesive.  Please notify our office if you develop more severe headaches or rash, and stop the patch.  Tendon healing with nitroglycerin patch may require 12 to 24 weeks depending on the extent of injury.  Men should not use if taking Viagra, Cialis, or Levitra.   Do not use if you have migraines or rosacea.

## 2017-12-20 NOTE — Progress Notes (Signed)
Diane Scott. [place], [place] at [facility] [place] - 42 y.o. female MRN [id number]  Date of birth: 1976/01/29  Visit Date: 12/20/2017  PCP: Lucretia Kern, DO   Referred by: Lucretia Kern, DO  Scribe for today's visit: Josepha Pigg, CMA     SUBJECTIVE:  Diane Scott is here for R elbow pain  Her R elbow pain symptoms INITIALLY: Began in Feb after [place] around the airport.  Described as moderate aching and tenderness to touch, radiating to the 1st and 2nd fingers.  Worsened with lifting things (even light things), driving, using her computer, using or moving hand Improved with rest but doesn't completely resolve.  Additional associated symptoms include: She has noticed swelling and increased warmth at the elbow.     At this time symptoms are worsening compared to onset  She has tried taking Motrin, using Pennsaid, and icing with no relief.   ROS Reports night time disturbances. Denies fevers, chills, or night sweats. Denies unexplained weight loss. Denies personal history of cancer. Denies changes in bowel or bladder habits. Denies recent unreported falls. Denies new or worsening dyspnea or wheezing. Denies headaches or dizziness.  Reports numbness, tingling or weakness  In the extremities.  Denies dizziness or presyncopal episodes Denies lower extremity edema    HISTORY & PERTINENT PRIOR DATA:  Prior History reviewed and updated per electronic medical record.  Significant/pertinent history, findings, studies include:  reports that she quit smoking about 17 years ago. She has never used smokeless tobacco. Recent Labs    10/10/17 0807 10/13/17 1526  HGBA1C 5.8  --   LABURIC  --  6.5   No specialty comments available. No problems updated.  OBJECTIVE:  VS:  HT:5' 5.5" (166.4 cm)   WT:235 lb 6.4 oz (106.8 kg)  BMI:38.56    BP:106/80  HR:91bpm  TEMP: ( )  RESP:97 %   PHYSICAL  EXAM: Constitutional: WDWN, Non-toxic appearing. Psychiatric: Alert & appropriately interactive.  Not depressed or anxious appearing. Respiratory: No increased work of breathing.  Trachea Midline Eyes: Pupils are equal.  EOM intact without nystagmus.  No scleral icterus  Vascular Exam: warm to touch no edema  upper extremity neuro exam: unremarkable normal strength normal sensation normal reflexes  MSK Exam: Right elbow is overall well aligned without deformity.  Full flexion extension.  She has pain over the lateral epicondyle.  She has pain over the common extensor tendons..  Positive Tinel over the midportion of the common extensors.  Grip strength is intact.  Positive text book test     ASSESSMENT & PLAN:   1. Lateral epicondylitis of right elbow     PLAN: Symptoms of the lateral fellow states with the concern for potential radial tunnel syndrome.  We will plan to start her on therapeutic exercises needed nitroglycerin protocol and follow-up in 6 weeks consider ultrasound at that time.  Discussed the foundation of treatment for this condition is physical therapy and/or daily (5-6 days/week) therapeutic exercises, focusing on core strengthening, coordination, neuromuscular control/reeducation.  Therapeutic exercises prescribed per procedure note.  Discussed options with the patient today including biologic treatment with topical nitroglycerin. Patient has no contraindications & understands the risks, benefits and intentions of treatment. Emphasized the importance of rotating sites as well as appropriate and expected adverse reactions including orthostasis, headache, adhesive sensitivity.  Begin with 1/4 patch to the affected area. Okay to titrate to half a patch as  tolerated.  Follow-up: Return in about 6 weeks (around 01/31/2018).        Please see additional documentation for Objective, Assessment and Plan sections. Pertinent additional documentation may be included in  corresponding procedure notes, imaging studies, problem based documentation and patient instructions. Please see these sections of the encounter for additional information regarding this visit.  CMA/ATC served as Education administrator during this visit. History, Physical, and Plan performed by medical provider. Documentation and orders reviewed and attested to.      Gerda Diss, [place] [facility] Physician

## 2017-12-20 NOTE — Progress Notes (Signed)
PROCEDURE NOTE: THERAPEUTIC EXERCISES (97110) 15 minutes spent for Therapeutic exercises as below and as referenced in the AVS.  This included exercises focusing on stretching, strengthening, with significant focus on eccentric aspects.   Proper technique shown and discussed handout in great detail with ATC.  All questions were discussed and answered.   Long term goals include an improvement in range of motion, strength, endurance as well as avoiding reinjury. Frequency of visits is one time as determined during today's  office visit. Frequency of exercises to be performed is as per handout.  EXERCISES REVIEWED:  Tennis elbow stretching and strengthening

## 2017-12-26 ENCOUNTER — Encounter: Payer: Self-pay | Admitting: Sports Medicine

## 2017-12-26 NOTE — Procedures (Signed)
LIMITED MSK ULTRASOUND OF Bilateral wrist Images were obtained and interpreted by myself, Teresa Coombs, DO  Images have been saved and stored to PACS system. Images obtained on: GE S7 Ultrasound machine  FINDINGS:   Right median nerve measures 0.17 cm   Left median nerve measures 0.15 cm  There are degenerative changes with spurring and a small amount of articular fluid within the IP and PIP joints.  No significant periosteal reaction. No evidence of significant ligamentous or tendinous injury.  IMPRESSION:  1. Bilateral moderate carpal tunnel syndrome 2. Degenerative changes of the PIP joint with no evidence of significant ligamentous disruption.

## 2018-01-30 ENCOUNTER — Encounter: Payer: Self-pay | Admitting: Sports Medicine

## 2018-01-30 ENCOUNTER — Ambulatory Visit (INDEPENDENT_AMBULATORY_CARE_PROVIDER_SITE_OTHER): Payer: PRIVATE HEALTH INSURANCE | Admitting: Sports Medicine

## 2018-01-30 VITALS — BP 120/82 | HR 89 | Ht 65.5 in | Wt 236.4 lb

## 2018-01-30 DIAGNOSIS — M7711 Lateral epicondylitis, right elbow: Secondary | ICD-10-CM

## 2018-01-30 DIAGNOSIS — G5621 Lesion of ulnar nerve, right upper limb: Secondary | ICD-10-CM | POA: Diagnosis not present

## 2018-01-30 DIAGNOSIS — M79641 Pain in right hand: Secondary | ICD-10-CM | POA: Diagnosis not present

## 2018-01-30 DIAGNOSIS — G5603 Carpal tunnel syndrome, bilateral upper limbs: Secondary | ICD-10-CM | POA: Diagnosis not present

## 2018-01-30 DIAGNOSIS — G5631 Lesion of radial nerve, right upper limb: Secondary | ICD-10-CM

## 2018-01-30 DIAGNOSIS — M79642 Pain in left hand: Secondary | ICD-10-CM

## 2018-01-30 MED ORDER — GABAPENTIN 300 MG PO CAPS
ORAL_CAPSULE | ORAL | 1 refills | Status: DC
Start: 2018-01-30 — End: 2018-03-13

## 2018-01-30 NOTE — Progress Notes (Signed)
Diane Scott. Rig[place], Diane Scott at [facility] [place] - 42 y.o. female MRN [id number]  Date of birth: September 20, 1975  Visit Date: 01/30/2018  PCP: Lucretia Kern, DO   Referred by: Lucretia Kern, DO  Scribe(s) for today's visit: Josepha Pigg, CMA  SUBJECTIVE:  Diane Scott is here for Follow-up (R elbow)   12/20/2017: Her R elbow pain symptoms INITIALLY: Began in Feb after [place] around the airport.  Described as moderate aching and tenderness to touch, radiating to the 1st and 2nd fingers.  Worsened with lifting things (even light things), driving, using her computer, using or moving hand Improved with rest but doesn't completely resolve.  Additional associated symptoms include: She has noticed swelling and increased warmth at the elbow.    At this time symptoms are worsening compared to onset  She has tried taking Motrin, using Pennsaid, and icing with no relief.   01/30/2018: Compared to the last office visit, her previously described symptoms show no change. She has tenderness around the elbow immediately after doing HEP. When she straightens her arm she has numbness down to her hand.  Current symptoms are moderate-severe & are radiating to 4th and 5th fingers. At times, if she straightens her arm quickly, she will have radiating pain and tingling/numbness into the entire hand and all fingers.  She has been doing HEP every other day, she is unable to do them daily d/t pain. She has been following Nitro Protocol with no side effects. She reports that the first time she used the nitroglycerin patches the did get light headed when she stood but it hasn't happened again since then. She has been doing ice massage after exercises. She has also been taking Motrin prn.    REVIEW OF SYSTEMS: Reports night time disturbances. Denies fevers, chills, or night sweats. Denies unexplained weight loss. Denies personal history of  cancer. Denies changes in bowel or bladder habits. Denies recent unreported falls. Denies new or worsening dyspnea or wheezing. Reports headaches or dizziness.  Reports numbness, tingling or weakness  In the extremities.  Denies dizziness or presyncopal episodes Denies lower extremity edema    HISTORY & PERTINENT PRIOR DATA:  Prior History reviewed and updated per electronic medical record.  Significant/pertinent history, findings, studies include:  reports that she quit smoking about 17 years ago. She has never used smokeless tobacco. Recent Labs    10/10/17 0807 10/13/17 1526  HGBA1C 5.8  --   LABURIC  --  6.5   No specialty comments available. No problems updated.  OBJECTIVE:  VS:  HT:5' 5.5" (166.4 cm)   WT:236 lb 6.4 oz (107.2 kg)  BMI:38.73    BP:120/82  HR:89bpm  TEMP: ( )  RESP:98 %   PHYSICAL EXAM: Constitutional: WDWN, Non-toxic appearing. Psychiatric: Alert & appropriately interactive.  Not depressed or anxious appearing. Respiratory: No increased work of breathing.  Trachea Midline Eyes: Pupils are equal.  EOM intact without nystagmus.  No scleral icterus  Vascular Exam: warm to touch no edema  upper extremity neuro exam: Pain with palpation of the cubital tunnel bilaterally with a positive Tinel's at both the Carpal Tunl. and Cubital Tunl. worse on the right than the left.  She has focal pain with palpation of the lateral condyle on the right as well as over the [place].   Pain with resisted wrist extension as well as grip strength.  She has more focal pain  however with resisted finger extension.   ASSESSMENT & PLAN:   1. Lateral epicondylitis of right elbow   2. Bilateral hand pain   3. Bilateral carpal tunnel syndrome   4. 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  Recommend starting B complex  vitamin as well as trial of gabapentin.  If any lack of improvement can consider further electrodiagnostic testing versus further evaluation of cervical radiculopathy.    Follow-up: Return in about 6 weeks (around 03/13/2018).       Please see additional documentation for Objective, Assessment and Plan sections. Pertinent additional documentation may be included in corresponding procedure notes, imaging studies, problem based documentation and patient instructions. Please see these sections of the encounter for additional information regarding this visit.  CMA/ATC served as Education administrator during this visit. History, Physical, and Plan performed by medical provider. Documentation and orders reviewed and attested to.      Diane Scott, [place] Sports Medicine Physician

## 2018-02-23 ENCOUNTER — Encounter: Payer: Self-pay | Admitting: Sports Medicine

## 2018-03-13 ENCOUNTER — Other Ambulatory Visit: Payer: Self-pay | Admitting: Sports Medicine

## 2018-03-13 ENCOUNTER — Ambulatory Visit (INDEPENDENT_AMBULATORY_CARE_PROVIDER_SITE_OTHER): Payer: PRIVATE HEALTH INSURANCE | Admitting: Sports Medicine

## 2018-03-13 ENCOUNTER — Encounter: Payer: Self-pay | Admitting: Sports Medicine

## 2018-03-13 VITALS — BP 108/78 | HR 90 | Ht 65.5 in | Wt 238.2 lb

## 2018-03-13 DIAGNOSIS — M24559 Contracture, unspecified hip: Secondary | ICD-10-CM

## 2018-03-13 DIAGNOSIS — M7711 Lateral epicondylitis, right elbow: Secondary | ICD-10-CM | POA: Diagnosis not present

## 2018-03-13 NOTE — Patient Instructions (Addendum)

## 2018-03-13 NOTE — Progress Notes (Signed)
[name]. Breion Novacek, [place] at [facility] - 42 y.o. female MRN [id number]  Date of birth: [birth year]  Visit Date: 03/13/2018  PCP: Lucretia Kern, DO   Referred by: Lucretia Kern, DO  Scribe(s) for today's visit: Wendy Poet, LAT, ATC  SUBJECTIVE:  Diane Scott is here for Follow-up (R elbow pain and B hand pain) .    12/20/2017: Her R elbow pain symptoms INITIALLY: Began in Feb after dragging her bags around the airport.  Described as moderate aching and tenderness to touch, radiating to the 1st and 2nd fingers.  Worsened with lifting things (even light things), driving, using her computer, using or moving hand Improved with rest but doesn't completely resolve.  Additional associated symptoms include: She has noticed swelling and increased warmth at the elbow.    At this time symptoms are worsening compared to onset  She has tried taking Motrin, using Pennsaid, and icing with no relief.   01/30/2018: Compared to the last office visit, her previously described symptoms show no change. She has tenderness around the elbow immediately after doing HEP. When she straightens her arm she has numbness down to her hand.  Current symptoms are moderate-severe & are radiating to 4th and 5th fingers. At times, if she straightens her arm quickly, she will have radiating pain and tingling/numbness into the entire hand and all fingers.  She has been doing HEP every other day, she is unable to do them daily d/t pain. She has been following Nitro Protocol with no side effects. She reports that the first time she used the nitroglycerin patches the did get light headed when she stood but it hasn't happened again since then. She has been doing ice massage after exercises. She has also been taking Motrin prn.   03/13/2018: Compared to the last office visit on 01/30/18, her previously described R elbow pain symptoms are improving w/  decreased pain and little to no N/T noted in the R forearm Current symptoms are mild-moderate & are nonradiating She has been intermittently using the nitroglycerin patches but hasn't used any in the past 1-2 weeks.  She is no longer using the Gabapentin and is doing her HEP 4-5x/week.   REVIEW OF SYSTEMS: Denies night time disturbances. Denies fevers, chills, or night sweats. Denies unexplained weight loss. Denies personal history of cancer. Denies changes in bowel or bladder habits. Denies recent unreported falls. Denies new or worsening dyspnea or wheezing. Denies headaches or dizziness.  Denies numbness, tingling or weakness  In the extremities.  Denies dizziness or presyncopal episodes Denies lower extremity edema    HISTORY & PERTINENT PRIOR DATA:  Prior History reviewed and updated per electronic medical record.  Significant/pertinent history, findings, studies include:  reports that she quit smoking about 17 years ago. She has never used smokeless tobacco. Recent Labs    10/10/17 0807 10/13/17 1526  HGBA1C 5.8  --   LABURIC  --  6.5   No specialty comments available. No problems updated.  OBJECTIVE:  VS:  HT:5' 5.5" (166.4 cm)   WT:238 lb 3.2 oz (108 kg)  BMI:39.02    BP:108/78  HR:90bpm  TEMP: ( )  RESP:98 %   PHYSICAL EXAM: Constitutional: WDWN, Non-toxic appearing. Psychiatric: Alert & appropriately interactive.  Not depressed or anxious appearing. Respiratory: No increased work of breathing.  Trachea Midline Eyes: Pupils are equal.  EOM intact without nystagmus.  No scleral icterus  Vascular  Exam: warm to touch no edema  upper extremity neuro exam: unremarkable normal strength normal sensation  MSK Exam: Elbow is overall significantly less tender to palpation.  She has good flexion and extension.  Supination and pronation strength is normal.  There finger extension is nonpainful.  Wrist extension is slightly painful.  She does have a generalized  anterior carriage of her head and anterior chain dominant posture.   ASSESSMENT & PLAN:   1. Lateral epicondylitis of right elbow   2. Hip flexor tightness, unspecified laterality     PLAN: Doing better.  Continue with home therapeutic exercise.  Links to [employer] provided today per Patient Instructions.  These exercises were developed by Minerva Ends, DC with a strong emphasis on core neuromuscular reducation and postural realignment through body-weight exercises.  Discussed the underlying features of tight hip flexors leading to crouched, fetal like position that results in spinal column compression.  Including lumbar hyperflexion with hypermobility, thoracic flexion with restrictive rotation and cervical lordosis reversal  Follow-up: Return if symptoms worsen or fail to improve.      Please see additional documentation for Objective, Assessment and Plan sections. Pertinent additional documentation may be included in corresponding procedure notes, imaging studies, problem based documentation and patient instructions. Please see these sections of the encounter for additional information regarding this visit.  CMA/ATC served as Education administrator during this visit. History, Physical, and Plan performed by medical provider. Documentation and orders reviewed and attested to.      Gerda Diss, [place] [facility] Physician

## 2018-05-07 ENCOUNTER — Encounter: Payer: Self-pay | Admitting: Sports Medicine

## 2018-08-03 ENCOUNTER — Other Ambulatory Visit: Payer: Self-pay

## 2018-08-03 ENCOUNTER — Encounter: Payer: Self-pay | Admitting: Obstetrics & Gynecology

## 2018-08-03 ENCOUNTER — Ambulatory Visit (INDEPENDENT_AMBULATORY_CARE_PROVIDER_SITE_OTHER): Payer: PRIVATE HEALTH INSURANCE | Admitting: Obstetrics & Gynecology

## 2018-08-03 VITALS — BP 118/80 | HR 92 | Resp 16 | Ht 65.0 in | Wt 240.0 lb

## 2018-08-03 DIAGNOSIS — Z01419 Encounter for gynecological examination (general) (routine) without abnormal findings: Secondary | ICD-10-CM

## 2018-08-03 NOTE — Progress Notes (Signed)
42 y.o. G32P2002 Married White or Caucasian female here for annual exam.  Doing well.  Denies vaginal bleeding.  Bladder control is really good.  Does get up once a night to void.    Patient's last menstrual period was 12/24/2014.          Sexually active: Yes.    The current method of family planning is status post hysterectomy.    Exercising: No.   Smoker:  no  Health Maintenance: Pap:  10/23/14 Neg. HR HPV:neg  History of abnormal Pap:  yes MMG:  09/15/16 BIRADS1:neg   Colonoscopy:  09/13/16 f/u 10 years  BMD:  Never TDaP:  2013 Screening Labs: PCP   reports that she quit smoking about 17 years ago. She has never used smokeless tobacco. She reports current alcohol use. She reports that she does not use drugs.  Past Medical History:  Diagnosis Date  . Arthritis   . Cystocele   . Depression   . Dysplasia of skin   . 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 Results for S65C were negative. The  mutations analyzed by [facility] are most common in the Caucasian  population. Although some patients with this genotype experience  biochemically defined abnormalities of iron overload, the penetrance  for clinical symptoms, such as cirrhosis, cardiomyopa  . GERD (gastroesophageal reflux disease)   . Migraine headache with aura 2012   hx numb face with migraine  . Obesity   . Osteoarthritis of both knees   . Tubular adenoma 05/12/2009    Past Surgical History:  Procedure Laterality Date  . ANTERIOR AND POSTERIOR REPAIR N/A 09/22/2015   Procedure: ANTERIOR (CYSTOCELE) AND POSTERIOR REPAIR (RECTOCELE) ;  Surgeon: Nunzio Cobbs, MD;  Location: [facility];  Service: Gynecology;  Laterality: N/A;  . BILATERAL SALPINGECTOMY Bilateral 01/12/2015   Procedure: BILATERAL SALPINGECTOMY;  Surgeon: Megan Salon, MD;  Location: [facility];  Service: Gynecology;  Laterality: Bilateral;  . BLADDER SUSPENSION N/A  09/22/2015   Procedure: TRANSVAGINAL TAPE (TVT) PROCEDURE;  Surgeon: Nunzio Cobbs, MD;  Location: [facility];  Service: Gynecology;  Laterality: N/A;  . CHOLECYSTECTOMY  2008  . COLONOSCOPY    . CYSTOSCOPY N/A 01/12/2015   Procedure: CYSTOSCOPY;  Surgeon: Megan Salon, MD;  Location: [facility];  Service: Gynecology;  Laterality: N/A;  . CYSTOSCOPY N/A 09/22/2015   Procedure: CYSTOSCOPY;  Surgeon: Nunzio Cobbs, MD;  Location: [facility];  Service: Gynecology;  Laterality: N/A;  . endometrial biopsy     neg per patient  . LEG SKIN LESION  BIOPSY / EXCISION Left 01/20/2015   [facility]-moderate dysplasia per patient  . ORIF FINGER FRACTURE    . ROBOTIC ASSISTED TOTAL HYSTERECTOMY N/A 01/12/2015   Procedure: ROBOTIC ASSISTED TOTAL HYSTERECTOMY;  Surgeon: Megan Salon, MD;  Location: [facility];  Service: Gynecology;  Laterality: N/A;  . TUBAL LIGATION    . uterine ablation  2/13    Current Outpatient Medications  Medication Sig Dispense Refill  . cholecalciferol (VITAMIN D3) 25 MCG (1000 UT) tablet Take 1,000 Units by mouth daily.    . Diclofenac Sodium (PENNSAID) 2 % SOLN Place 1 application onto the skin 2 (two) times daily. 112 g 2  . lamoTRIgine (LAMICTAL) 100 MG tablet Take 1 tablet by mouth daily.    . methylphenidate (RITALIN) 10 MG tablet Take 10 mg by  mouth 3 (three) times daily with meals.     No current facility-administered medications for this visit.     Family History  Problem Relation Age of Onset  . Arthritis Mother   . Hypertension Mother   . Hyperlipidemia Mother   . Stroke Mother   . Arthritis Father   . Hyperlipidemia Father   . Hypertension Father   . Diabetes Father   . Melanoma Maternal Aunt   . Melanoma Paternal Uncle   . Arthritis Maternal Grandmother   . Alcohol abuse Maternal Grandfather   . Squamous cell carcinoma Maternal Grandfather   . Arthritis Paternal Grandmother   . Melanoma Paternal Grandmother   . Alcohol abuse Paternal  Grandfather   . Squamous cell carcinoma Maternal Aunt   . Colon cancer Paternal Uncle   . Liver cancer Paternal Uncle     Review of Systems  All other systems reviewed and are negative.   Exam:   BP 118/80 (BP Location: Right Arm, Patient Position: Sitting, Cuff Size: Large)   Pulse 92   Resp 16   Ht 5\' 5"  (1.651 m)   Wt 240 lb (108.9 kg)   LMP 12/24/2014   BMI 39.94 kg/m     Height: 5\' 5"  (165.1 cm)  Ht Readings from Last 3 Encounters:  08/03/18 5\' 5"  (1.651 m)  03/13/18 5' 5.5" (1.664 m)  01/30/18 5' 5.5" (1.664 m)    General appearance: alert, cooperative and appears stated age Head: Normocephalic, without obvious abnormality, atraumatic Neck: no adenopathy, supple, symmetrical, trachea midline and thyroid normal to inspection and palpation Lungs: clear to auscultation bilaterally Breasts: normal appearance, no masses or tenderness Heart: regular rate and rhythm Abdomen: soft, non-tender; bowel sounds normal; no masses,  no organomegaly Extremities: extremities normal, atraumatic, no cyanosis or edema Skin: Skin color, texture, turgor normal. No rashes or lesions Lymph nodes: Cervical, supraclavicular, and axillary nodes normal. No abnormal inguinal nodes palpated Neurologic: Grossly normal   Pelvic: External genitalia:  no lesions              Urethra:  normal appearing urethra with no masses, tenderness or lesions              Bartholins and Skenes: normal                 Vagina: normal appearing vagina with normal color and discharge, no lesions              Cervix: absent              Pap taken: No. Bimanual Exam:  Uterus:  uterus absent              Adnexa: no mass, fullness, tenderness               Rectovaginal: Confirms               Anus:  normal sphincter tone, no lesions  Chaperone was present for exam.  A:  Well Woman with normal exam H/O robotic TLH/bilateral salpingectomy 3/16 and then A&P repair 1/17 Family hx of hemochromatosis H/O migraines  with aura Family hx of melanoma  P:   Mammogram guidelines reviewed.  She is doing MMGs every 2 years pap smear not indicated Has blood work planned with Dr. Maudie Mercury in February 000Return annually or prn

## 2018-10-04 ENCOUNTER — Ambulatory Visit: Payer: Self-pay

## 2018-10-04 ENCOUNTER — Ambulatory Visit: Payer: PRIVATE HEALTH INSURANCE | Admitting: Sports Medicine

## 2018-10-04 ENCOUNTER — Encounter: Payer: Self-pay | Admitting: Sports Medicine

## 2018-10-04 VITALS — BP 120/84 | HR 90 | Ht 65.0 in | Wt 239.6 lb

## 2018-10-04 DIAGNOSIS — G5603 Carpal tunnel syndrome, bilateral upper limbs: Secondary | ICD-10-CM

## 2018-10-04 DIAGNOSIS — G5621 Lesion of ulnar nerve, right upper limb: Secondary | ICD-10-CM

## 2018-10-04 NOTE — Patient Instructions (Addendum)
You had an injection today.  Things to be aware of after injection are listed below: . You may experience no significant improvement or even a slight worsening in your symptoms during the first 24 to 48 hours.  After that we expect your symptoms to improve gradually over the next 2 weeks for the medicine to have its maximal effect.  You should continue to have improvement out to 6 weeks after your injection. . Dr. Paulla Fore recommends icing the site of the injection for 20 minutes  1-2 times the day of your injection . You may shower but no swimming, tub bath or Jacuzzi for 24 hours. . If your bandage falls off this does not need to be replaced.  It is appropriate to remove the bandage after 4 hours. . You may resume light activities as tolerated unless otherwise directed per Dr. Paulla Fore during your visit  POSSIBLE STEROID SIDE EFFECTS:  Side effects from injectable steroids tend to be less than when taken orally however you may experience some of the symptoms listed below.  If experienced these should only last for a short period of time. Change in menstrual flow  Edema (swelling)  Increased appetite Skin flushing (redness)  Skin rash/acne  Thrush (oral) Yeast vaginitis    Increased sweating  Depression Increased blood glucose levels Cramping and leg/calf  Euphoria (feeling happy)  POSSIBLE PROCEDURE SIDE EFFECTS: The side effects of the injection are usually fairly minimal however if you may experience some of the following side effects that are usually self-limited and will is off on their own.  If you are concerned please feel free to call the office with questions:  Increased numbness or tingling  Nausea or vomiting  Swelling or bruising at the injection site   Please call our office if if you experience any of the following symptoms over the next week as these can be signs of infection:   Fever greater than 100.10F  Significant swelling at the injection site  Significant redness or drainage  from the injection site  If after 2 weeks you are continuing to have worsening symptoms please call our office to discuss what the next appropriate actions should be including the potential for a return office visit or other diagnostic testing.   Frequent icing is recommended.  You can ice for 10-15 minutes at a time 3-4 times per day if needed.  It is important to be sure to ice immediately after activity. For the foot/ankle and hand/wrist it is sometimes easier and more effective to soak in a bucket of cool water.   The water should not be miserably cold, a good rule to go by his if there is any ice floating by the end of the 10-15 minutes it was likely too cold.

## 2018-10-04 NOTE — Progress Notes (Signed)
Diane Scott. Diane Scott, [place] at [facility] - 43 y.o. female MRN [id number]  Date of birth: [birth year]  Visit Date: October 04, 2018  PCP: Lucretia Kern, DO   Referred by: Lucretia Kern, DO  SUBJECTIVE:  Chief Complaint  Patient presents with  . Follow-up    Wrist and hand pain.    HPI: Patient is here with worsening right wrist and hand symptoms of the median nerve distribution.  She has been having mild to moderate pain over the last 2 weeks as well as some swelling.  Worse first thing in the morning when she wakes up she is getting worse symptoms with typing and wrist flexion.  She is been using Pennsaid, night splints and neurodynamic mouse and keyboard with padding  REVIEW OF SYSTEMS: No significant nighttime awakenings due to this issue. Denies fevers, chills, recent weight gain or weight loss.  No night sweats.  Moderate numbness and tingling.  HISTORY:  Prior history reviewed and updated per electronic medical record.  Patient Active Problem List   Diagnosis Date Noted  . Bilateral hand pain 10/13/2017  . Gene mutation 10/05/2017    On screening for hemochromatosis due to [place] 2018 "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 Results for S65C were negative. The  mutations analyzed by [facility] are most common in the Caucasian  population. Although some patients with this genotype experience  biochemically defined abnormalities of iron overload, the penetrance  for clinical symptoms, such as cirrhosis, cardiomyopathy, diabetes and  arthropathy, is low."  Iron normal.   . BMI 40.0-44.9, adult ([place]) 10/05/2017  . Stress incontinence 11/20/2014  . OA (osteoarthritis) of knee 12/26/2013  . Cystocele, lateral 12/23/2010  . Headache, migraine 11/11/2010  . Atypical nevus 10/05/2009  . Clinical depression 10/05/2009  . Carpal tunnel syndrome 10/05/2009  . Acid reflux 05/12/2009    Social History   Occupational History  . Not on file  Tobacco Use  . Smoking status: Former Smoker    Last attempt to quit: 09/22/2000    Years since quitting: 18.0  . Smokeless tobacco: Never Used  Substance and Sexual Activity  . Alcohol use: Yes    Alcohol/week: 0.0 - 2.0 standard drinks  . Drug use: No  . Sexual activity: Yes    Partners: Male    Birth control/protection: Surgical    Comment: BTL/Hyst   Social History   Social History Narrative   Work or School: NP heaptology      Home Situation: lives with husband and 2 children      Spiritual Beliefs: none      Lifestyle: elliptical; working on diet             OBJECTIVE:  VS:  HT:5\' 5"  (165.1 cm)   WT:239 lb 9.6 oz (108.7 kg)  BMI:39.87    BP:120/84  HR:90bpm  TEMP: ( )  RESP:98 %   PHYSICAL EXAM: Adult female. No acute distress.  Alert and appropriate. Bilateral hands are overall well aligned.  No significant thenar atrophy.  She has pain with Tinel's and carpal tunnel compression test on the right, none on the left.  She has a well-healed postsurgical incision over the ring finger on the right.  The A1 pulleys have only minimal pain.  Negative Tinel's on the left.   ASSESSMENT:  1. Ulnar neuropathy of right upper extremity   2. Bilateral carpal tunnel syndrome  PROCEDURES:  US Guided Injection per procedure note      PLAN:  Pertinent additional documentation may be included in corresponding procedure notes, imaging studies, problem based documentation and patient instructions.  No problem-specific Assessment & Plan notes found for this encounter.   Symptoms are consistent with acute on chronic carpal tunnel syndrome that is likely mild to moderate in nature.  We will go ahead and inject her today and see if she has good improvement with this.  If any lack of improvement can consider referral for nerve conduction studies.  Median nerves actually measure slightly smaller than in the past  however there is a moderate amount of perineural swelling with a positive halo sign consistent with acute neuritis.  Activity modifications and the importance of avoiding exacerbating activities (limiting pain to no more than a 4 / 10 during or following activity) recommended and discussed.  Discussed red flag symptoms that warrant earlier emergent evaluation and patient voices understanding.   No orders of the defined types were placed in this encounter.  Lab Orders  No laboratory test(s) ordered today    Imaging Orders     [place] [facility] POCT ULTRASOUND Referral Orders  No referral(s) requested today    Return in about 6 weeks (around 11/15/2018).          Gerda Diss, [place] [facility] Physician

## 2018-10-04 NOTE — Procedures (Signed)
PROCEDURE NOTE:  Ultrasound Guided: Injection: Right carpal tunnel Hydro dissection Images were obtained and interpreted by myself, Teresa Coombs, DO  Images have been saved and stored to PACS system. Images obtained on: GE S7 Ultrasound machine    ULTRASOUND FINDINGS:  Right median nerve measures 0.12 cm in surface area.  Left median nerve measures 0.9 cm  The middle finger on the right does have a small amount of triggering minimal tenosynovitis but there is some mild joint effusion and mild synovitis of the third MCP.  DESCRIPTION OF PROCEDURE:  The patient's clinical condition is marked by substantial pain and/or significant functional disability. Other conservative therapy has not provided relief, is contraindicated, or not appropriate. There is a reasonable likelihood that injection will significantly improve the patient's pain and/or functional impairment.   After discussing the risks, benefits and expected outcomes of the injection and all questions were reviewed and answered, the patient wished to undergo the above named procedure.  Verbal consent was obtained.  The ultrasound was used to identify the target structure and adjacent neurovascular structures. The skin was then prepped in sterile fashion and the target structure was injected under direct visualization using sterile technique as below:  Single injection performed as below: PREP: Alcohol and Ethel Chloride APPROACH:direct, single injection, 25g 1.5 in. INJECTATE: 0.5 cc 1% lidocaine, 0.5 cc 0.5% Marcaine and 0.5 cc 40mg /mL DepoMedrol ASPIRATE: None DRESSING: Band-Aid  Post procedural instructions including recommending icing and warning signs for infection were reviewed.    This procedure was well tolerated and there were no complications.   IMPRESSION: Succesful Ultrasound Guided: Injection

## 2018-10-21 DIAGNOSIS — R509 Fever, unspecified: Secondary | ICD-10-CM | POA: Insufficient documentation

## 2018-11-15 ENCOUNTER — Ambulatory Visit: Payer: PRIVATE HEALTH INSURANCE | Admitting: Sports Medicine

## 2018-11-23 ENCOUNTER — Telehealth: Payer: PRIVATE HEALTH INSURANCE | Admitting: Nurse Practitioner

## 2018-11-23 DIAGNOSIS — R6889 Other general symptoms and signs: Principal | ICD-10-CM

## 2018-11-23 DIAGNOSIS — Z20822 Contact with and (suspected) exposure to covid-19: Secondary | ICD-10-CM

## 2018-11-23 NOTE — Progress Notes (Signed)
E-Visit for Corona Virus Screening  Based on your current symptoms, you may very well have the virus, however your symptoms are mild. Currently, not all patients are being tested. If the symptoms are mild and there is not a known exposure, performing the test is not indicated.  Coronavirus disease 2019 (COVID-19) is a respiratory illness that can spread from person to person. The virus that causes COVID-19 is a new virus that was first identified in the country of [place] but is now found in multiple other countries and has spread to the [place].  Symptoms associated with the virus are mild to severe fever, cough, and shortness of breath. There is currently no vaccine to protect against COVID-19, and there is no specific antiviral treatment for the virus.   To be considered HIGH RISK for Coronavirus (COVID-19), you have to meet the following criteria:  . Traveled to [place], [place], [place], [place] or [place]; or in the [place] to [place], [place], [place], or [place]; and have fever, cough, and shortness of breath within the last 2 weeks of travel OR  . Been in close contact with a person diagnosed with COVID-19 within the last 2 weeks and have fever, cough, and shortness of breath  . IF YOU DO NOT MEET THESE CRITERIA, YOU ARE CONSIDERED LOW RISK FOR COVID-19.   It is vitally important that if you feel that you have an infection such as this virus or any other virus that you stay home and away from places where you may spread it to others.  You should self-quarantine for 14 days if you have symptoms that could potentially be coronavirus and avoid contact with people age 58 and older.   You can use medication such as delsym or mucinex OTC for cough  You may also take acetaminophen (Tylenol) as needed for fever.   Reduce your risk of any infection by using the same precautions used for avoiding the common cold or flu:  [name] Wash your hands often with soap and warm water for at least  20 seconds.  If soap and water are not readily available, use an alcohol-based hand sanitizer with at least 60% alcohol.  . If coughing or sneezing, cover your mouth and nose by coughing or sneezing into the elbow areas of your shirt or coat, into a tissue or into your sleeve (not your hands). . Avoid shaking hands with others and consider head nods or verbal greetings only. . Avoid touching your eyes, nose, or mouth with unwashed hands.  . Avoid close contact with people who are sick. . Avoid places or events with large numbers of people in one location, like concerts or sporting events. . Carefully consider travel plans you have or are making. . If you are planning any travel outside or inside the [place], visit the CDC's Travelers' Health webpage for the latest health notices. . If you have some symptoms but not all symptoms, continue to monitor at home and seek medical attention if your symptoms worsen. . If you are having a medical emergency, call 911.  HOME CARE . Only take medications as instructed by your medical team. . Drink plenty of fluids and get plenty of rest. . A steam or ultrasonic humidifier can help if you have congestion.   GET HELP RIGHT AWAY IF: . You develop worsening fever. . You become short of breath . You cough up blood. . Your symptoms become more severe MAKE SURE YOU   Understand these  instructions.  Will watch your condition.  Will get help right away if you are not doing well or get worse.  Your e-visit answers were reviewed by a board certified advanced clinical practitioner to complete your personal care plan.  Depending on the condition, your plan could have included both over the counter or prescription medications.  If there is a problem please reply once you have received a response from your provider. Your safety is important to [place].  If you have drug allergies check your prescription carefully.    You can use MyChart to ask questions about today's  visit, request a non-urgent call back, or ask for a work or school excuse for 24 hours related to this e-Visit. If it has been greater than 24 hours you will need to follow up with your provider, or enter a new e-Visit to address those concerns. You will get an e-mail in the next two days asking about your experience.  I hope that your e-visit has been valuable and will speed your recovery. Thank you for using e-visits.  5 minutes spent reviewing and documenting in chart.

## 2018-12-05 ENCOUNTER — Encounter: Payer: Self-pay | Admitting: Sports Medicine

## 2018-12-06 ENCOUNTER — Ambulatory Visit: Payer: PRIVATE HEALTH INSURANCE | Admitting: Sports Medicine

## 2018-12-10 ENCOUNTER — Encounter: Payer: Self-pay | Admitting: Family Medicine

## 2018-12-10 NOTE — Telephone Encounter (Signed)
I called the pt and informed her of the message below.  Patient stated she prefers an appt tomorrow, this was scheduled at 11am and [email or web address] message was sent.

## 2018-12-11 ENCOUNTER — Encounter (HOSPITAL_BASED_OUTPATIENT_CLINIC_OR_DEPARTMENT_OTHER): Payer: Self-pay | Admitting: *Deleted

## 2018-12-11 ENCOUNTER — Ambulatory Visit (INDEPENDENT_AMBULATORY_CARE_PROVIDER_SITE_OTHER): Payer: PRIVATE HEALTH INSURANCE | Admitting: Family Medicine

## 2018-12-11 ENCOUNTER — Emergency Department (HOSPITAL_BASED_OUTPATIENT_CLINIC_OR_DEPARTMENT_OTHER)
Admission: EM | Admit: 2018-12-11 | Discharge: 2018-12-11 | Disposition: A | Discharge status: Home / Self Care | Payer: PRIVATE HEALTH INSURANCE | Attending: Emergency Medicine | Admitting: Emergency Medicine

## 2018-12-11 ENCOUNTER — Encounter: Payer: Self-pay | Admitting: Family Medicine

## 2018-12-11 ENCOUNTER — Emergency Department (HOSPITAL_BASED_OUTPATIENT_CLINIC_OR_DEPARTMENT_OTHER): Payer: PRIVATE HEALTH INSURANCE

## 2018-12-11 ENCOUNTER — Other Ambulatory Visit: Payer: Self-pay

## 2018-12-11 VITALS — Temp 99.7°F

## 2018-12-11 DIAGNOSIS — Z20822 Contact with and (suspected) exposure to covid-19: Secondary | ICD-10-CM

## 2018-12-11 DIAGNOSIS — Z79899 Other long term (current) drug therapy: Secondary | ICD-10-CM | POA: Insufficient documentation

## 2018-12-11 DIAGNOSIS — Z87891 Personal history of nicotine dependence: Secondary | ICD-10-CM | POA: Diagnosis not present

## 2018-12-11 DIAGNOSIS — R0602 Shortness of breath: Secondary | ICD-10-CM

## 2018-12-11 DIAGNOSIS — Z20828 Contact with and (suspected) exposure to other viral communicable diseases: Secondary | ICD-10-CM | POA: Insufficient documentation

## 2018-12-11 DIAGNOSIS — R0609 Other forms of dyspnea: Secondary | ICD-10-CM

## 2018-12-11 DIAGNOSIS — R5381 Other malaise: Secondary | ICD-10-CM | POA: Diagnosis not present

## 2018-12-11 DIAGNOSIS — R6889 Other general symptoms and signs: Secondary | ICD-10-CM

## 2018-12-11 DIAGNOSIS — R509 Fever, unspecified: Secondary | ICD-10-CM

## 2018-12-11 LAB — CBC WITH DIFFERENTIAL/PLATELET
Abs Immature Granulocytes: 0.05 10*3/uL (ref 0.00–0.07)
Basophils Absolute: 0 10*3/uL (ref 0.0–0.1)
Basophils Relative: 0 %
Eosinophils Absolute: 0.2 10*3/uL (ref 0.0–0.5)
Eosinophils Relative: 2 %
HCT: 41.5 % (ref 36.0–46.0)
Hemoglobin: 13.3 g/dL (ref 12.0–15.0)
Immature Granulocytes: 1 %
Lymphocytes Relative: 24 %
Lymphs Abs: 2 10*3/uL (ref 0.7–4.0)
MCH: 29.2 pg (ref 26.0–34.0)
MCHC: 32 g/dL (ref 30.0–36.0)
MCV: 91 fL (ref 80.0–100.0)
Monocytes Absolute: 0.5 10*3/uL (ref 0.1–1.0)
Monocytes Relative: 6 %
Neutro Abs: 5.5 10*3/uL (ref 1.7–7.7)
Neutrophils Relative %: 67 %
Platelets: 246 10*3/uL (ref 150–400)
RBC: 4.56 MIL/uL (ref 3.87–5.11)
RDW: 12.8 % (ref 11.5–15.5)
WBC: 8.3 10*3/uL (ref 4.0–10.5)
nRBC: 0 % (ref 0.0–0.2)

## 2018-12-11 LAB — BASIC METABOLIC PANEL
Anion gap: 7 (ref 5–15)
BUN: 13 mg/dL (ref 6–20)
CO2: 26 mmol/L (ref 22–32)
Calcium: 8.9 mg/dL (ref 8.9–10.3)
Chloride: 103 mmol/L (ref 98–111)
Creatinine, Ser: 0.72 mg/dL (ref 0.44–1.00)
GFR calc Af Amer: >60 mL/min (ref >60)
GFR calc non Af Amer: >60 mL/min (ref >60)
Glucose, Bld: 97 mg/dL (ref 70–99)
Potassium: 4.1 mmol/L (ref 3.5–5.1)
Sodium: 136 mmol/L (ref 135–145)

## 2018-12-11 LAB — BRAIN NATRIURETIC PEPTIDE: B Natriuretic Peptide: 26.1 pg/mL (ref 0.0–100.0)

## 2018-12-11 LAB — TROPONIN I: Troponin I: <0.03 ng/mL (ref <0.03)

## 2018-12-11 LAB — D-DIMER, QUANTITATIVE: D-Dimer, Quant: <0.27 ug/mL-FEU (ref 0.00–0.50)

## 2018-12-11 NOTE — ED Provider Notes (Signed)
[facility] EMERGENCY DEPARTMENT Provider Note   CSN: [id number] Arrival date & time: 12/11/18  1211    History   Chief Complaint Chief Complaint  Patient presents with  . Shortness of Breath    HPI Diane Scott is a 43 y.o. female.     43 yo F with chief complaint of shortness of breath.  Going on for the past 3 weeks.  Patient has had low-grade fevers as well as high as 101.  He usually cycles throughout the day.  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  She gave blood about a week before her symptoms started and she had a phlebotomist that she thinks was sick.  Otherwise denies sick contacts.  Denies recent travel.  She denies cough or congestion.  States that she really just has chest pain that is worse on exertion and improves with rest.  Left side sharp worse with deep breaths.  She denies hemoptysis denies any lateral lower extremity edema denies history of PE or DVT denies recent surgery or immobilization.  She did have some nausea vomiting and diarrhea at the onset of the illness but this is resolved.  She has had multiple phone visits with her PCP and they encouraged her to come to the ED for evaluation.  The history is provided by the patient.  Shortness of Breath  Severity:  Mild Onset quality:  Gradual Duration:  3 weeks Timing:  Constant Progression:  Worsening Chronicity:  New Relieved by:  Nothing Worsened by:  Nothing Ineffective treatments:  None tried Associated symptoms: fever   Associated symptoms: no chest pain, no headaches, no vomiting and no wheezing     Past Medical History:  Diagnosis Date  . Arthritis   . Cystocele   . Depression   . Dysplasia of skin   . 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 Results for S65C were negative. The  mutations analyzed by [facility] are most common in  the Caucasian  population. Although some patients with this genotype experience  biochemically defined abnormalities of iron overload, the penetrance  for clinical symptoms, such as cirrhosis, cardiomyopa  . GERD (gastroesophageal reflux disease)   . Migraine headache with aura 2012   hx numb face with migraine  . Obesity   . Osteoarthritis of both knees   . Tubular adenoma 05/12/2009    Patient Active Problem List   Diagnosis Date Noted  . Bilateral hand pain 10/13/2017  . 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 BMI 40.0-44.9, adult ([place]) 10/05/2017  . Stress incontinence 11/20/2014  . OA (osteoarthritis) of knee 12/26/2013  . Cystocele, lateral 12/23/2010  . Headache, migraine 11/11/2010  . Atypical nevus 10/05/2009  . Clinical depression 10/05/2009  . Carpal tunnel syndrome 10/05/2009  . Acid reflux 05/12/2009    Past Surgical History:  Procedure Laterality Date  . ANTERIOR AND POSTERIOR REPAIR N/A 09/22/2015   Procedure: ANTERIOR (CYSTOCELE) AND POSTERIOR REPAIR (RECTOCELE) ;  Surgeon: Nunzio Cobbs, MD;  Location: [facility];  Service: Gynecology;  Laterality: N/A;  . BILATERAL SALPINGECTOMY Bilateral 01/12/2015   Procedure: BILATERAL SALPINGECTOMY;  Surgeon: Megan Salon, MD;  Location: [facility];  Service: Gynecology;  Laterality: Bilateral;  . BLADDER SUSPENSION N/A 09/22/2015   Procedure: TRANSVAGINAL TAPE (TVT) PROCEDURE;  Surgeon: Nunzio Cobbs, MD;  Location:  [facility];  Service: Gynecology;  Laterality: N/A;  . CHOLECYSTECTOMY  2008  . COLONOSCOPY    . CYSTOSCOPY N/A 01/12/2015   Procedure: CYSTOSCOPY;  Surgeon: Megan Salon, MD;  Location: [facility];  Service: Gynecology;  Laterality: N/A;  . CYSTOSCOPY N/A 09/22/2015   Procedure: CYSTOSCOPY;  Surgeon: Nunzio Cobbs, MD;  Location: [facility];  Service: Gynecology;  Laterality: N/A;  . endometrial biopsy     neg per patient  . LEG SKIN LESION  BIOPSY / EXCISION Left 01/20/2015    Dermatology-moderate  dysplasia per patient  . ORIF FINGER FRACTURE    . ROBOTIC ASSISTED TOTAL HYSTERECTOMY N/A 01/12/2015   Procedure: ROBOTIC ASSISTED TOTAL HYSTERECTOMY;  Surgeon: Megan Salon, MD;  Location: [facility];  Service: Gynecology;  Laterality: N/A;  . TUBAL LIGATION    . uterine ablation  2/13     OB History    Gravida  2   Para  2   Term  2   Preterm      AB      Living  2     SAB      TAB      Ectopic      Multiple      Live Births  2            Home Medications    Prior to Admission medications   Medication Sig Start Date End Date Taking? Authorizing Provider  cholecalciferol (VITAMIN D3) 25 MCG (1000 UT) tablet Take 1,000 Units by mouth daily.   Yes [provider]  Diclofenac Sodium (PENNSAID) 2 % SOLN Place 1 application onto the skin 2 (two) times daily. 10/13/17  Yes Gerda Diss, DO  lamoTRIgine (LAMICTAL) 100 MG tablet Take 1 tablet by mouth daily. 04/06/17  Yes [provider]  methylphenidate (RITALIN) 10 MG tablet Take 20 mg by mouth 3 (three) times daily with meals.    Yes [provider]    Family History Family History  Problem Relation Age of Onset  . Arthritis Mother   . Hypertension Mother   . Hyperlipidemia Mother   . Stroke Mother   . Arthritis Father   . Hyperlipidemia Father   . Hypertension Father   . Diabetes Father   . Melanoma Maternal Aunt   . Melanoma Paternal Uncle   . Arthritis Maternal Grandmother   . Alcohol abuse Maternal Grandfather   . Squamous cell carcinoma Maternal Grandfather   . Arthritis Paternal Grandmother   . Melanoma Paternal Grandmother   . Alcohol abuse Paternal Grandfather   . Squamous cell carcinoma Maternal Aunt   . Colon cancer Paternal Uncle   . Liver cancer Paternal Uncle     Social History Social History   Tobacco Use  . Smoking status: Former Smoker    Last attempt to quit: 09/22/2000    Years since quitting: 18.2  . Smokeless tobacco: Never Used  Substance Use  Topics  . Alcohol use: Yes    Alcohol/week: 0.0 - 2.0 standard drinks  . Drug use: No     Allergies   Patient has no known allergies.   Review of Systems Review of Systems  Constitutional: Positive for chills and fever.  HENT: Negative for congestion and rhinorrhea.   Eyes: Negative for redness and visual disturbance.  Respiratory: Positive for shortness of breath. Negative for wheezing.   Cardiovascular: Negative for chest pain and palpitations.  Gastrointestinal: Negative for nausea and vomiting.  Genitourinary: Negative  for dysuria and urgency.  Musculoskeletal: Positive for myalgias. Negative for arthralgias.  Skin: Negative for pallor and wound.  Neurological: Negative for dizziness and headaches.     Physical Exam Updated Vital Signs BP 128/82   Pulse 89   Temp 98.2 F (36.8 C) (Oral)   Resp 20   Ht 5\' 6"  (1.676 m)   Wt 109.8 kg   LMP 12/24/2014   SpO2 97%   BMI 39.06 kg/m   Physical Exam Vitals signs and nursing note reviewed.  Constitutional:      General: She is not in acute distress.    Appearance: She is well-developed. She is not diaphoretic.  HENT:     Head: Normocephalic and atraumatic.     Comments: Swollen turbinates, posterior nasal drip, no noted sinus ttp, tm normal bilaterally.   Eyes:     Pupils: Pupils are equal, round, and reactive to light.  Neck:     Musculoskeletal: Normal range of motion and neck supple.  Cardiovascular:     Rate and Rhythm: Normal rate and regular rhythm.     Heart sounds: No murmur. No friction rub. No gallop.   Pulmonary:     Effort: Pulmonary effort is normal.     Breath sounds: No wheezing or rales.  Abdominal:     General: There is no distension.     Palpations: Abdomen is soft.     Tenderness: There is no abdominal tenderness.  Musculoskeletal:        General: No tenderness.  Skin:    General: Skin is warm and dry.  Neurological:     Mental Status: She is alert and oriented to person, place, and  time.  Psychiatric:        Behavior: Behavior normal.      ED Treatments / Results  Labs (all labs ordered are listed, but only abnormal results are displayed) Labs Reviewed  NOVEL CORONAVIRUS, NAA (HOSPITAL ORDER, SEND-OUT TO REF LAB)  TROPONIN I  BRAIN NATRIURETIC PEPTIDE  CBC WITH DIFFERENTIAL/PLATELET  BASIC METABOLIC PANEL  D-DIMER, QUANTITATIVE (NOT AT [facility])    EKG None  Radiology Dg Chest Port 1 View  Result Date: 12/11/2018 CLINICAL DATA:  Shortness of breath with chest pain and fever EXAM: PORTABLE CHEST 1 VIEW COMPARISON:  July 24, 2015 FINDINGS: Lungs are clear. Heart size is upper normal with pulmonary vascularity normal. No adenopathy. No pneumothorax. No bone lesions. IMPRESSION: No edema or consolidation.  Stable cardiac silhouette. Electronically Signed   By: Lowella Grip III M.D.   On: 12/11/2018 13:05    Procedures Procedures (including critical care time)  Medications Ordered in ED Medications - No data to display   Initial Impression / Assessment and Plan / ED Course  I have reviewed the triage vital signs and the nursing notes.  Pertinent labs & imaging results that were available during my care of the patient were reviewed by me and considered in my medical decision making (see chart for details).        43 yo F with a chief complaint of exertional shortness of breath and pleuritic chest pain.  Going on for 3 weeks.  She has had some low-grade fevers as well.  She has had multiple ED visits with her primary care physician who was concerned that she had the novel coronavirus.  She however has not had any coughing.  This makes me were worried about the pulmonary embolism is possible pathology.  She would be PERC negative with the exception  of her tachycardia on arrival.  I will obtain a d-dimer.  Troponin BNP CBC BMP chest x-ray.  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  Patient's troponin is negative d-dimer is negative her BNP is 26.  CBC without anemia no significant electrolyte abnormality patient with no renal dysfunction.  Chest x-ray was viewed by me without focal infiltrate or pneumothorax.  I feel this is unlikely to be ACS or pulmonary embolism.  With the coronavirus outbreak that certainly is a possible diagnosis though is a bit odd that the patient does not having a cough with this.  I offered antibiotic therapy though with discussion with the patient it seems unlikely to be beneficial without her coughing.  We will have her continue to follow-up with her PCP.  Instructed return for worsening shortness of breath.  We will have her continue to self isolate at home.  Diane Scott was evaluated in Emergency Department on 12/11/2018 for the symptoms described in the history of present illness. He/she was evaluated in the context of the global COVID-19 pandemic, which necessitated consideration that the patient might be at risk for infection with the SARS-CoV-2 virus that causes COVID-19. Institutional protocols and algorithms that pertain to the evaluation of patients at risk for COVID-19 are in a state of rapid change based on information released by regulatory bodies including the CDC and federal and state organizations. These policies and algorithms were followed during the patient's care in the ED.  2:37 PM:  I have discussed the diagnosis/risks/treatment options with the patient and believe the pt to be eligible for discharge home to follow-up with PCP. We also discussed returning to the ED immediately if new or worsening sx occur. We discussed the sx which are most concerning (e.g., sudden worsening pain, fever, inability to tolerate by mouth) that necessitate immediate return. Medications administered to the patient during their visit and any new prescriptions provided to the patient are listed below.  Medications given during this visit  Medications - No data to display   The patient appears reasonably screen and/or stabilized for discharge and I doubt any other medical condition or other [facility] requiring further screening, evaluation, or treatment in the ED at this time prior to discharge.    Final Clinical Impressions(s) / ED Diagnoses   Final diagnoses:  SOB (shortness of breath) on exertion  Suspected Covid-19 Virus Infection    ED Discharge Orders    None       Deno Etienne, DO 12/11/18 1437

## 2018-12-11 NOTE — Discharge Instructions (Signed)
Person Under Monitoring Name: Diane Scott  Location: [place] [id number]   Infection Prevention Recommendations for Individuals Confirmed to have, or Being Evaluated for, 2019 Novel Coronavirus (COVID-19) Infection Who Receive Care at Home  Individuals who are confirmed to have, or are being evaluated for, COVID-19 should follow the prevention steps below until a healthcare provider or local or state health department says they can return to normal activities.  Stay home except to get medical care You should restrict activities outside your home, except for getting medical care. Do not go to work, school, or public areas, and do not use public transportation or taxis.  Call ahead before visiting your doctor Before your medical appointment, call the healthcare provider and tell them that you have, or are being evaluated for, COVID-19 infection. This will help the healthcare providers office take steps to keep other people from getting infected. Ask your healthcare provider to call the local or state health department.  Monitor your symptoms Seek prompt medical attention if your illness is worsening (e.g., difficulty breathing). Before going to your medical appointment, call the healthcare provider and tell them that you have, or are being evaluated for, COVID-19 infection. Ask your healthcare provider to call the local or state health department.  Wear a facemask You should wear a facemask that covers your nose and mouth when you are in the same room with other people and when you visit a healthcare provider. People who live with or visit you should also wear a facemask while they are in the same room with you.  Separate yourself from other people in your home As much as possible, you should stay in a different room from other people in your home. Also, you should use a separate bathroom, if available.  Avoid sharing household items You should not share  dishes, drinking glasses, cups, eating utensils, towels, bedding, or other items with other people in your home. After using these items, you should wash them thoroughly with soap and water.  Cover your coughs and sneezes Cover your mouth and nose with a tissue when you cough or sneeze, or you can cough or sneeze into your sleeve. Throw used tissues in a lined trash can, and immediately wash your hands with soap and water for at least 20 seconds or use an alcohol-based hand rub.  Wash your [facility] your hands often and thoroughly with soap and water for at least 20 seconds. You can use an alcohol-based hand sanitizer if soap and water are not available and if your hands are not visibly dirty. Avoid touching your eyes, nose, and mouth with unwashed hands.   Prevention Steps for Caregivers and Household Members of Individuals Confirmed to have, or Being Evaluated for, COVID-19 Infection Being Cared for in the Home  If you live with, or provide care at home for, a person confirmed to have, or being evaluated for, COVID-19 infection please follow these guidelines to prevent infection:  Follow healthcare providers instructions Make sure that you understand and can help the patient follow any healthcare provider instructions for all care.  Provide for the patients basic needs You should help the patient with basic needs in the home and provide support for getting groceries, prescriptions, and other personal needs.  Monitor the patients symptoms If they are getting sicker, call his or her medical provider and tell them that the patient has, or is being evaluated for, COVID-19 infection. This will help the healthcare providers  office take steps to keep other people from getting infected. Ask the healthcare provider to call the local or state health department.  Limit the number of people who have contact with the patient If possible, have only one caregiver for the patient. Other  household members should stay in another home or place of residence. If this is not possible, they should stay in another room, or be separated from the patient as much as possible. Use a separate bathroom, if available. Restrict visitors who do not have an essential need to be in the home.  Keep older adults, very young children, and other sick people away from the patient Keep older adults, very young children, and those who have compromised immune systems or chronic health conditions away from the patient. This includes people with chronic heart, lung, or kidney conditions, diabetes, and cancer.  Ensure good ventilation Make sure that shared spaces in the home have good air flow, such as from an air conditioner or an opened window, weather permitting.  Wash your hands often Wash your hands often and thoroughly with soap and water for at least 20 seconds. You can use an alcohol based hand sanitizer if soap and water are not available and if your hands are not visibly dirty. Avoid touching your eyes, nose, and mouth with unwashed hands. Use disposable paper towels to dry your hands. If not available, use dedicated cloth towels and replace them when they become wet.  Wear a facemask and gloves Wear a disposable facemask at all times in the room and gloves when you touch or have contact with the patients blood, body fluids, and/or secretions or excretions, such as sweat, saliva, sputum, nasal mucus, vomit, urine, or feces.  Ensure the mask fits over your nose and mouth tightly, and do not touch it during use. Throw out disposable facemasks and gloves after using them. Do not reuse. Wash your hands immediately after removing your facemask and gloves. If your personal clothing becomes contaminated, carefully remove clothing and launder. Wash your hands after handling contaminated clothing. Place all used disposable facemasks, gloves, and other waste in a lined container before disposing them with  other household waste. Remove gloves and wash your hands immediately after handling these items.  Do not share dishes, glasses, or other household items with the patient Avoid sharing household items. You should not share dishes, drinking glasses, cups, eating utensils, towels, bedding, or other items with a patient who is confirmed to have, or being evaluated for, COVID-19 infection. After the person uses these items, you should wash them thoroughly with soap and water.  Wash laundry thoroughly Immediately remove and wash clothes or bedding that have blood, body fluids, and/or secretions or excretions, such as sweat, saliva, sputum, nasal mucus, vomit, urine, or feces, on them. Wear gloves when handling laundry from the patient. Read and follow directions on labels of laundry or clothing items and detergent. In general, wash and dry with the warmest temperatures recommended on the label.  Clean all areas the individual has used often Clean all touchable surfaces, such as counters, tabletops, doorknobs, bathroom fixtures, toilets, phones, keyboards, tablets, and bedside tables, every day. Also, clean any surfaces that may have blood, body fluids, and/or secretions or excretions on them. Wear gloves when cleaning surfaces the patient has come in contact with. Use a diluted bleach solution (e.g., dilute bleach with 1 part bleach and 10 parts water) or a household disinfectant with a label that says EPA-registered for coronaviruses. To make a  bleach solution at home, add 1 tablespoon of bleach to 1 quart (4 cups) of water. For a larger supply, add  cup of bleach to 1 gallon (16 cups) of water. Read labels of cleaning products and follow recommendations provided on product labels. Labels contain instructions for safe and effective use of the cleaning product including precautions you should take when applying the product, such as wearing gloves or eye protection and making sure you have good ventilation  during use of the product. Remove gloves and wash hands immediately after cleaning.  Monitor yourself for signs and symptoms of illness Caregivers and household members are considered close contacts, should monitor their health, and will be asked to limit movement outside of the home to the extent possible. Follow the monitoring steps for close contacts listed on the symptom monitoring form.   ? If you have additional questions, contact your local health department or call the epidemiologist on call at [phone] (available 24/7). ? This guidance is subject to change. For the most up-to-date guidance from [facility], please refer to their website: [email or web address]

## 2018-12-11 NOTE — ED Notes (Signed)
Pt states she has "pleuritic" chest pain to upper L that hurts with every breath. Pt also describes it as a "stabby" pain. Denies radiation of pain and is not reproducible to palpation.

## 2018-12-11 NOTE — ED Triage Notes (Signed)
States she has been "sick" for 21 days. She is no better with chest pressure, fever. No cough.

## 2018-12-11 NOTE — Progress Notes (Signed)
Virtual Visit via Video Note  I connected with@  on 12/11/18 at 11:00 AM EDT by a video enabled telemedicine application and verified that I am speaking with the correct person using two identifiers.  Location patient: home Location provider:work or home office Persons participating in the virtual visit: patient, provider  I discussed the limitations of evaluation and management by telemedicine and the availability of in person appointments. The patient expressed understanding and agreed to proceed.   HPI:  Resp Illness: -started 22 days ago -symptoms:intermittent fevers (100.6-102 at night, 99 in the evenings), fatigue, chest tighness, sob with exertion, occ CP, body aches -temp is 99.7 today -temp last night 100.7 -pulse ox 97 -pulse is 93 -has had bronchitis in the past and required alb once, but denies hx lung dz or asthma -reports no known COVID19 exposure, but when did an evisit last week she was told likely has COVID and to go to ED if worse, but no testing -she feels SOB has worsened gradually -no vomiting, diarrhea, hemoptysis -several family members with GI symptoms, fevers of shorter duration   ROS: See pertinent positives and negatives per HPI.  Past Medical History:  Diagnosis Date  . Arthritis   . Cystocele   . Depression   . Dysplasia of skin   . 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 Results for S65C were negative. The  mutations analyzed by [facility] are most common in the Caucasian  population. Although some patients with this genotype experience  biochemically defined abnormalities of iron overload, the penetrance  for clinical symptoms, such as cirrhosis, cardiomyopa  . GERD (gastroesophageal reflux disease)   . Migraine headache with aura 2012   hx numb face with migraine  . Obesity   . Osteoarthritis of both knees   . Tubular adenoma 05/12/2009    Past  Surgical History:  Procedure Laterality Date  . ANTERIOR AND POSTERIOR REPAIR N/A 09/22/2015   Procedure: ANTERIOR (CYSTOCELE) AND POSTERIOR REPAIR (RECTOCELE) ;  Surgeon: Nunzio Cobbs, MD;  Location: [facility];  Service: Gynecology;  Laterality: N/A;  . BILATERAL SALPINGECTOMY Bilateral 01/12/2015   Procedure: BILATERAL SALPINGECTOMY;  Surgeon: Megan Salon, MD;  Location: [facility];  Service: Gynecology;  Laterality: Bilateral;  . BLADDER SUSPENSION N/A 09/22/2015   Procedure: TRANSVAGINAL TAPE (TVT) PROCEDURE;  Surgeon: Nunzio Cobbs, MD;  Location: [facility];  Service: Gynecology;  Laterality: N/A;  . CHOLECYSTECTOMY  2008  . COLONOSCOPY    . CYSTOSCOPY N/A 01/12/2015   Procedure: CYSTOSCOPY;  Surgeon: Megan Salon, MD;  Location: [facility];  Service: Gynecology;  Laterality: N/A;  . CYSTOSCOPY N/A 09/22/2015   Procedure: CYSTOSCOPY;  Surgeon: Nunzio Cobbs, MD;  Location: [facility];  Service: Gynecology;  Laterality: N/A;  . endometrial biopsy     neg per patient  . LEG SKIN LESION  BIOPSY / EXCISION Left 01/20/2015   [facility]-moderate dysplasia per patient  . ORIF FINGER FRACTURE    . ROBOTIC ASSISTED TOTAL HYSTERECTOMY N/A 01/12/2015   Procedure: ROBOTIC ASSISTED TOTAL HYSTERECTOMY;  Surgeon: Megan Salon, MD;  Location: [facility];  Service: Gynecology;  Laterality: N/A;  . TUBAL LIGATION    . uterine ablation  2/13    Family History  Problem Relation Age of Onset  . Arthritis Mother   . Hypertension Mother   . Hyperlipidemia Mother   .  Stroke Mother   . Arthritis Father   . Hyperlipidemia Father   . Hypertension Father   . Diabetes Father   . Melanoma Maternal Aunt   . Melanoma Paternal Uncle   . Arthritis Maternal Grandmother   . Alcohol abuse Maternal Grandfather   . Squamous cell carcinoma Maternal Grandfather   . Arthritis Paternal Grandmother   . Melanoma Paternal Grandmother   . Alcohol abuse Paternal Grandfather   . Squamous cell  carcinoma Maternal Aunt   . Colon cancer Paternal Uncle   . Liver cancer Paternal Uncle     SOCIAL HX: see hpi   Current Outpatient Medications:  .  cholecalciferol (VITAMIN D3) 25 MCG (1000 UT) tablet, Take 1,000 Units by mouth daily., Disp: , Rfl:  .  Diclofenac Sodium (PENNSAID) 2 % SOLN, Place 1 application onto the skin 2 (two) times daily., Disp: 112 g, Rfl: 2 .  lamoTRIgine (LAMICTAL) 100 MG tablet, Take 1 tablet by mouth daily., Disp: , Rfl:  .  methylphenidate (RITALIN) 10 MG tablet, Take 20 mg by mouth 3 (three) times daily with meals. , Disp: , Rfl:   EXAM:  VITALS per patient if applicable: -temp is 49.1 today -temp last night 100.7 -pulse ox 97 -pulse 93  GENERAL: alert, oriented, appears well and in no acute distress  HEENT: atraumatic, conjunttiva clear, no obvious abnormalities on inspection of external nose and ears  NECK: normal movements of the head and neck  LUNGS: on inspection no signs of respiratory distress, breathing rate appears normal, no obvious gross SOB, gasping or wheezing  CV: no obvious cyanosis  MS: moves all visible extremities without noticeable abnormality  PSYCH/NEURO: pleasant and cooperative, no obvious depression or anxiety, speech and thought processing grossly intact  ASSESSMENT AND PLAN:  Discussed the following assessment and plan:  DOE (dyspnea on exertion)  SOB (shortness of breath)  Fever, unspecified fever cause  Malaise  -we discussed possible serious and likely etiologies, workup and treatment, treatment risks and return precautions. 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 She agrees and agrees to go to [facility] care as we do not currently have access to these in the ambulatory setin gin light of the COVID 19 pandemic. Further tx pending eval. -after  this discussion, Tirzah opted for evaluation at ER and agrees to go now.   I discussed the assessment and treatment plan with the patient. The patient was provided an opportunity to ask questions and all were answered. The patient agreed with the plan and demonstrated an understanding of the instructions.   The patient was advised to call back or seek an in-person evaluation if the symptoms worsen or if the condition fails to improve as anticipated.   Lucretia Kern, DO

## 2018-12-13 LAB — NOVEL CORONAVIRUS, NAA (HOSP ORDER, SEND-OUT TO REF LAB; TAT 18-24 HRS): SARS-CoV-2, NAA: NOT DETECTED

## 2018-12-18 ENCOUNTER — Ambulatory Visit (INDEPENDENT_AMBULATORY_CARE_PROVIDER_SITE_OTHER): Payer: PRIVATE HEALTH INSURANCE | Admitting: Family Medicine

## 2018-12-18 ENCOUNTER — Encounter: Payer: Self-pay | Admitting: Family Medicine

## 2018-12-18 ENCOUNTER — Other Ambulatory Visit: Payer: Self-pay

## 2018-12-18 VITALS — HR 85 | Temp 99.6°F

## 2018-12-18 DIAGNOSIS — R0981 Nasal congestion: Secondary | ICD-10-CM | POA: Diagnosis not present

## 2018-12-18 DIAGNOSIS — R0602 Shortness of breath: Secondary | ICD-10-CM | POA: Diagnosis not present

## 2018-12-18 DIAGNOSIS — R509 Fever, unspecified: Secondary | ICD-10-CM

## 2018-12-18 MED ORDER — DOXYCYCLINE HYCLATE 100 MG PO TABS: 100.0000 mg | ORAL_TABLET | Freq: Two times a day (BID) | ORAL | 0 refills | Status: DC

## 2018-12-18 NOTE — Progress Notes (Signed)
Virtual Visit via Video Note  I connected with Diane Scott  on 12/18/18 at  2:30 PM EDT by a video enabled telemedicine application and verified that I am speaking with the correct person using two identifiers.  Location patient: home Location provider:work or home office Persons participating in the virtual visit: patient, provider  I discussed the limitations of evaluation and management by telemedicine and the availability of in person appointments. The patient expressed understanding and agreed to proceed.   HPI:  Follow up respiratory illness: -almost 4 weeks -extensive labs, cxr and covid testing all looked good -however she is still feeling tired, still intermittent low grade temps 99-low 100s, stil mild SOB at times -her whole family had the same and husband and daughter are still with the same temps 99s to low 100s. -SOB better.  No sig cough, nausea, vomiting, sore throat, loss of taste, headache, rashes, swollen glands, diarrhea, bleeding, dizziness -She has some allergy issues - sneezing, mild nasal congestion, PND.  Pulse ox 98 Temp 99.7 HR 85  ROS: See pertinent positives and negatives per HPI.  Past Medical History:  Diagnosis Date  . Arthritis   . Cystocele   . Depression   . Dysplasia of skin   . 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 Results for S65C were negative. The  mutations analyzed by [facility] are most common in the Caucasian  population. Although some patients with this genotype experience  biochemically defined abnormalities of iron overload, the penetrance  for clinical symptoms, such as cirrhosis, cardiomyopa  . GERD (gastroesophageal reflux disease)   . Migraine headache with aura 2012   hx numb face with migraine  . Obesity   . Osteoarthritis of both knees   . Tubular adenoma 05/12/2009    Past Surgical History:  Procedure Laterality Date  . ANTERIOR AND  POSTERIOR REPAIR N/A 09/22/2015   Procedure: ANTERIOR (CYSTOCELE) AND POSTERIOR REPAIR (RECTOCELE) ;  Surgeon: Nunzio Cobbs, MD;  Location: [facility];  Service: Gynecology;  Laterality: N/A;  . BILATERAL SALPINGECTOMY Bilateral 01/12/2015   Procedure: BILATERAL SALPINGECTOMY;  Surgeon: Megan Salon, MD;  Location: [facility];  Service: Gynecology;  Laterality: Bilateral;  . BLADDER SUSPENSION N/A 09/22/2015   Procedure: TRANSVAGINAL TAPE (TVT) PROCEDURE;  Surgeon: Nunzio Cobbs, MD;  Location: [facility];  Service: Gynecology;  Laterality: N/A;  . CHOLECYSTECTOMY  2008  . COLONOSCOPY    . CYSTOSCOPY N/A 01/12/2015   Procedure: CYSTOSCOPY;  Surgeon: Megan Salon, MD;  Location: [facility];  Service: Gynecology;  Laterality: N/A;  . CYSTOSCOPY N/A 09/22/2015   Procedure: CYSTOSCOPY;  Surgeon: Nunzio Cobbs, MD;  Location: [facility];  Service: Gynecology;  Laterality: N/A;  . endometrial biopsy     neg per patient  . LEG SKIN LESION  BIOPSY / EXCISION Left 01/20/2015   [facility]-moderate dysplasia per patient  . ORIF FINGER FRACTURE    . ROBOTIC ASSISTED TOTAL HYSTERECTOMY N/A 01/12/2015   Procedure: ROBOTIC ASSISTED TOTAL HYSTERECTOMY;  Surgeon: Megan Salon, MD;  Location: [facility];  Service: Gynecology;  Laterality: N/A;  . TUBAL LIGATION    . uterine ablation  2/13    Family History  Problem Relation Age of Onset  . Arthritis Mother   . Hypertension Mother   . Hyperlipidemia Mother   . Stroke Mother   . Arthritis Father   .  Hyperlipidemia Father   . Hypertension Father   . Diabetes Father   . Melanoma Maternal Aunt   . Melanoma Paternal Uncle   . Arthritis Maternal Grandmother   . Alcohol abuse Maternal Grandfather   . Squamous cell carcinoma Maternal Grandfather   . Arthritis Paternal Grandmother   . Melanoma Paternal Grandmother   . Alcohol abuse Paternal Grandfather   . Squamous cell carcinoma Maternal Aunt   . Colon cancer Paternal Uncle   . Liver  cancer Paternal Uncle     SOCIAL HX: see hpi   Current Outpatient Medications:  .  cholecalciferol (VITAMIN D3) 25 MCG (1000 UT) tablet, Take 1,000 Units by mouth daily., Disp: , Rfl:  .  Diclofenac Sodium (PENNSAID) 2 % SOLN, Place 1 application onto the skin 2 (two) times daily., Disp: 112 g, Rfl: 2 .  lamoTRIgine (LAMICTAL) 100 MG tablet, Take 1 tablet by mouth daily., Disp: , Rfl:  .  methylphenidate (RITALIN) 10 MG tablet, Take 20 mg by mouth 3 (three) times daily with meals. , Disp: , Rfl:  .  doxycycline (VIBRA-TABS) 100 MG tablet, Take 1 tablet (100 mg total) by mouth 2 (two) times daily., Disp: 20 tablet, Rfl: 0  EXAM:  VITALS per patient if applicable:  Pulse ox 98 Temp 99.7 HR 85  GENERAL: alert, oriented, appears well and in no acute distress  HEENT: atraumatic, conjunttiva clear, no obvious abnormalities on inspection of external nose and ears, no pain with tugging tragus, normal inspection of the oropharynx, no tonsillar edema or exudate, no sinus TTP with patient palpates over sinuses  NECK: normal movements of the head and neck, no meningeal signs  LUNGS: on inspection no signs of respiratory distress, breathing rate appears normal, no obvious gross SOB, gasping or wheezing  CV: no obvious cyanosis  MS: moves all visible extremities without noticeable abnormality  PSYCH/NEURO: pleasant and cooperative, no obvious depression or anxiety, speech and thought processing grossly intact  ASSESSMENT AND PLAN:  Discussed the following assessment and plan:  Fever, unspecified fever cause  SOB (shortness of breath)  Nasal congestion  Discuss potential etiologies infectious, inflammatory, oncologic, etc. Discussed various options for further evaluation. She prefers to avoid further eval for now as the labs and prior tests, including rheum labs recently were all reassuring, she is improving, and family is all dealing with the same. Discussed possibility of false neg COVID  testing, have heard of cases where symptoms linger, allergies, tick borne illness, sinusitus or walking pneumonia not picked up on CXR. Given duration of symptoms and still with occ fever opted to try course of doxy that would cover several of these. She agrees to call us in 1 week if all symptoms are not resolved. Discussed risks with doxy and proper use. 100 bid x 10 days.   I discussed the assessment and treatment plan with the patient. The patient was provided an opportunity to ask questions and all were answered. The patient agreed with the plan and demonstrated an understanding of the instructions.   The patient was advised to call back or seek an in-person evaluation if the symptoms worsen or if the condition fails to improve as anticipated.  Needs TOC with Dr. Ethlyn Gallery, she agrees to call back for this.   Lucretia Kern, DO

## 2018-12-20 ENCOUNTER — Ambulatory Visit: Payer: PRIVATE HEALTH INSURANCE | Admitting: Sports Medicine

## 2018-12-25 ENCOUNTER — Other Ambulatory Visit: Payer: Self-pay

## 2018-12-25 ENCOUNTER — Ambulatory Visit (INDEPENDENT_AMBULATORY_CARE_PROVIDER_SITE_OTHER): Payer: PRIVATE HEALTH INSURANCE | Admitting: Family Medicine

## 2018-12-25 ENCOUNTER — Encounter: Payer: Self-pay | Admitting: Family Medicine

## 2018-12-25 VITALS — Temp 100.2°F

## 2018-12-25 DIAGNOSIS — M25531 Pain in right wrist: Secondary | ICD-10-CM | POA: Diagnosis not present

## 2018-12-25 DIAGNOSIS — R509 Fever, unspecified: Secondary | ICD-10-CM

## 2018-12-25 DIAGNOSIS — R5381 Other malaise: Secondary | ICD-10-CM

## 2018-12-25 NOTE — Progress Notes (Signed)
Virtual Visit via Video Note  I connected with Diane Scott  on 12/25/18 at  1:40 PM EDT by a video enabled telemedicine application and verified that I am speaking with the correct person using two identifiers.  Location patient: home Location provider:work or home office Persons participating in the virtual visit: patient, provider  I discussed the limitations of evaluation and management by telemedicine and the availability of in person appointments. The patient expressed understanding and agreed to proceed.   HPI:  Follow up intermittent fevers: -reports till intermittently having fevers  despite on day 6 of doxy -she came down with febrile resp illness about 5-6 weeks ago, presumed COVID (though testing neg) -husband and children had the same and took unusually long time to get rid of fevers as well - she actually report husband whom is 3 weeks into this is still also running intermittent temps over 100.4 -she reports temps around a little over 101 on several occassions a few days ago and intermittent temps over 100 nearly daily the last few days with a temp of 100.2 currently -she had been monitoring temps with this same thermometer prior to getting sick as is a [employer] and did not have temps -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  Past Medical History:  Diagnosis Date  . Arthritis   . Cystocele   . Depression   . Dysplasia of skin    . 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 Results for S65C were negative. The  mutations analyzed by [facility] are most common in the Caucasian  population. Although some patients with this genotype experience  biochemically defined abnormalities of iron overload, the penetrance  for clinical symptoms, such as cirrhosis, cardiomyopa  . GERD (gastroesophageal reflux disease)   . Migraine headache with aura 2012   hx numb face with migraine  . Obesity   . Osteoarthritis of both knees   . Tubular adenoma 05/12/2009    Past Surgical History:  Procedure Laterality Date  . ANTERIOR AND POSTERIOR REPAIR N/A 09/22/2015   Procedure: ANTERIOR (CYSTOCELE) AND POSTERIOR REPAIR (RECTOCELE) ;  Surgeon: Nunzio Cobbs, MD;  Location: [facility];  Service: Gynecology;  Laterality: N/A;  . BILATERAL SALPINGECTOMY Bilateral 01/12/2015   Procedure: BILATERAL SALPINGECTOMY;  Surgeon: Megan Salon, MD;  Location: [facility];  Service: Gynecology;  Laterality: Bilateral;  . BLADDER SUSPENSION N/A 09/22/2015   Procedure: TRANSVAGINAL TAPE (TVT) PROCEDURE;  Surgeon: Nunzio Cobbs, MD;  Location: [facility];  Service: Gynecology;  Laterality: N/A;  . CHOLECYSTECTOMY  2008  . COLONOSCOPY    . CYSTOSCOPY N/A 01/12/2015   Procedure: CYSTOSCOPY;  Surgeon: Megan Salon, MD;  Location: [facility];  Service: Gynecology;  Laterality: N/A;  . CYSTOSCOPY N/A 09/22/2015   Procedure: CYSTOSCOPY;  Surgeon: Nunzio Cobbs,  MD;  Location: [facility];  Service: Gynecology;  Laterality: N/A;  . endometrial biopsy     neg per patient  . LEG SKIN LESION  BIOPSY / EXCISION Left 01/20/2015   [facility]-moderate dysplasia per patient  . ORIF FINGER FRACTURE    . ROBOTIC ASSISTED TOTAL HYSTERECTOMY N/A 01/12/2015   Procedure: ROBOTIC ASSISTED TOTAL HYSTERECTOMY;  Surgeon: Megan Salon, MD;  Location: [facility];  Service:  Gynecology;  Laterality: N/A;  . TUBAL LIGATION    . uterine ablation  2/13    Family History  Problem Relation Age of Onset  . Arthritis Mother   . Hypertension Mother   . Hyperlipidemia Mother   . Stroke Mother   . Arthritis Father   . Hyperlipidemia Father   . Hypertension Father   . Diabetes Father   . Melanoma Maternal Aunt   . Melanoma Paternal Uncle   . Arthritis Maternal Grandmother   . Alcohol abuse Maternal Grandfather   . Squamous cell carcinoma Maternal Grandfather   . Arthritis Paternal Grandmother   . Melanoma Paternal Grandmother   . Alcohol abuse Paternal Grandfather   . Squamous cell carcinoma Maternal Aunt   . Colon cancer Paternal Uncle   . Liver cancer Paternal Uncle     SOCIAL HX: see hpi    Current Outpatient Medications:  .  cholecalciferol (VITAMIN D3) 25 MCG (1000 UT) tablet, Take 1,000 Units by mouth daily., Disp: , Rfl:  .  Diclofenac Sodium (PENNSAID) 2 % SOLN, Place 1 application onto the skin 2 (two) times daily., Disp: 112 g, Rfl: 2 .  doxycycline (VIBRA-TABS) 100 MG tablet, Take 1 tablet (100 mg total) by mouth 2 (two) times daily., Disp: 20 tablet, Rfl: 0 .  lamoTRIgine (LAMICTAL) 100 MG tablet, Take 1 tablet by mouth daily., Disp: , Rfl:  .  methylphenidate (RITALIN) 10 MG tablet, Take 20 mg by mouth 3 (three) times daily with meals. , Disp: , Rfl:   EXAM:  VITALS per patient if applicable: Today's Vitals   12/25/18 1335  Temp: 100.2 F (37.9 C)   There is no height or weight on file to calculate BMI.   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  LUNGS: on inspection  no signs of respiratory distress, breathing rate appears normal, no obvious gross SOB, gasping or wheezing  CV: no obvious cyanosis  MS: moves all visible extremities without noticeable abnormality  SKIN: no rashes on exposed portions of the skin  PSYCH/NEURO: pleasant and cooperative, no obvious depression or anxiety, speech and thought processing grossly intact  ASSESSMENT AND PLAN:  Discussed the following assessment and plan:  Fever, unspecified fever cause - Plan: CBC with Differential/Platelet, Comprehensive metabolic panel, HIV Antibody (routine testing w rflx), Quantiferon tb gold assay, Mononucleosis screen, Sedimentation Rate, C-reactive Protein, Lactate Dehydrogenase, Rheumatoid Factor, ANA, Ambulatory referral to Infectious Disease, Electrophoresis, Protein and Immunofixation  Right wrist pain  Malaise - Plan: Ambulatory referral to Infectious Disease  -we discussed possible serious and likely etiologies, workup and treatment, treatment risks and return precautions. While a prolonged reaction to a viral illness is possible given the onset with resp illness and  the whole family has had similar, advised further workup given the duration of fevers to exclude other infectious, neoplastic, rheumatological causes. I also feel she needs to see ID in regards to return to work, as she wishes to return. She currently is working from home, but reports has pts she would like to see in office if is allowed to return. We also talked about seeing rheum - particularly if any repeat rheum labs are positive or if ongoing wrist/hand pain. Also discussed a possible oncology referral. Discussed CT abd and pelvis. -after this discussion, Usha opted for labs per orders, referral to ID, finish doxy course and consideration rheum/onc referral pending results/symptoms. -follow up advised 2 weeks -of course, we advised Lakoda  to return or notify a doctor immediately if symptoms worsen or persist or new  concerns arise.    I discussed the assessment and treatment plan with the patient. The patient was provided an opportunity to ask questions and all were answered. The patient agreed with the plan and demonstrated an understanding of the instructions.   The patient was advised to call back or seek an in-person evaluation if the symptoms worsen or if the condition fails to improve as anticipated.   Follow up instructions: Advised assistant Wendie Simmer to help patient arrange the following:  -order UA and culture -lab visit in the next 1 week (patient will need to wear mask and person drawing blood will need to wear mask, face sheild, gown and gloves and clean thoroughly after the visit - pt has no respiratory symptoms) -follow up with Dr. Maudie Mercury in 2 weeks -I sent an ID referral   Lucretia Kern, DO

## 2018-12-25 NOTE — Addendum Note (Signed)
Addended by: Agnes Lawrence on: 12/25/2018 04:03 PM   Modules accepted: Orders

## 2018-12-26 ENCOUNTER — Other Ambulatory Visit: Payer: PRIVATE HEALTH INSURANCE

## 2018-12-26 ENCOUNTER — Other Ambulatory Visit: Payer: Self-pay

## 2018-12-26 LAB — SEDIMENTATION RATE: Sed Rate: 21 mm/hr — ABNORMAL HIGH (ref 0–20)

## 2018-12-26 LAB — COMPREHENSIVE METABOLIC PANEL
ALT: 18 U/L (ref 0–35)
AST: 14 U/L (ref 0–37)
Albumin: 4.2 g/dL (ref 3.5–5.2)
Alkaline Phosphatase: 64 U/L (ref 39–117)
BUN: 17 mg/dL (ref 6–23)
CO2: 24 mEq/L (ref 19–32)
Calcium: 9.1 mg/dL (ref 8.4–10.5)
Chloride: 102 mEq/L (ref 96–112)
Creatinine, Ser: 0.69 mg/dL (ref 0.40–1.20)
GFR: 92.47 mL/min (ref >60.00)
Glucose, Bld: 89 mg/dL (ref 70–99)
Potassium: 4.1 mEq/L (ref 3.5–5.1)
Sodium: 136 mEq/L (ref 135–145)
Total Bilirubin: 0.3 mg/dL (ref 0.2–1.2)
Total Protein: 6.7 g/dL (ref 6.0–8.3)

## 2018-12-26 LAB — POC URINALSYSI DIPSTICK (AUTOMATED)
Bilirubin, UA: NEGATIVE
Blood, UA: NEGATIVE
Glucose, UA: NEGATIVE
Ketones, UA: NEGATIVE
Leukocytes, UA: NEGATIVE
Nitrite, UA: NEGATIVE
Protein, UA: NEGATIVE
Spec Grav, UA: >=1.03 — AB
Urobilinogen, UA: 0.2 U/dL
pH, UA: 5.5

## 2018-12-26 LAB — CBC WITH DIFFERENTIAL/PLATELET
Basophils Absolute: 0 K/uL (ref 0.0–0.1)
Basophils Relative: 0.4 % (ref 0.0–3.0)
Eosinophils Absolute: 0.2 K/uL (ref 0.0–0.7)
Eosinophils Relative: 1.7 % (ref 0.0–5.0)
HCT: 40.9 % (ref 36.0–46.0)
Hemoglobin: 13.7 g/dL (ref 12.0–15.0)
Lymphocytes Relative: 25.1 % (ref 12.0–46.0)
Lymphs Abs: 3 K/uL (ref 0.7–4.0)
MCHC: 33.5 g/dL (ref 30.0–36.0)
MCV: 88.1 fl (ref 78.0–100.0)
Monocytes Absolute: 0.7 K/uL (ref 0.1–1.0)
Monocytes Relative: 5.6 % (ref 3.0–12.0)
Neutro Abs: 8 K/uL — ABNORMAL HIGH (ref 1.4–7.7)
Neutrophils Relative %: 67.2 % (ref 43.0–77.0)
Platelets: 263 K/uL (ref 150.0–400.0)
RBC: 4.65 Mil/uL (ref 3.87–5.11)
RDW: 13.3 % (ref 11.5–15.5)
WBC: 12 K/uL — ABNORMAL HIGH (ref 4.0–10.5)

## 2018-12-26 LAB — MONONUCLEOSIS SCREEN: Mono Screen: NEGATIVE

## 2018-12-26 LAB — C-REACTIVE PROTEIN: CRP: <1 mg/dL (ref 0.5–20.0)

## 2018-12-26 NOTE — Addendum Note (Signed)
Addended by: Gwynne Edinger on: 12/26/2018 01:31 PM   Modules accepted: Orders

## 2018-12-26 NOTE — Addendum Note (Signed)
Addended by: Gwynne Edinger on: 12/26/2018 01:33 PM   Modules accepted: Orders

## 2018-12-26 NOTE — Addendum Note (Signed)
Addended by: Gwynne Edinger on: 12/26/2018 01:30 PM   Modules accepted: Orders

## 2018-12-27 ENCOUNTER — Ambulatory Visit: Payer: PRIVATE HEALTH INSURANCE | Admitting: Family Medicine

## 2018-12-28 LAB — PROTEIN ELECTROPHORESIS, SERUM
Albumin ELP: 3.9 g/dL (ref 3.8–4.8)
Alpha 1: 0.3 g/dL (ref 0.2–0.3)
Alpha 2: 0.7 g/dL (ref 0.5–0.9)
Beta 2: 0.4 g/dL (ref 0.2–0.5)
Beta Globulin: 0.5 g/dL (ref 0.4–0.6)
Gamma Globulin: 0.9 g/dL (ref 0.8–1.7)
Total Protein: 6.8 g/dL (ref 6.1–8.1)

## 2018-12-28 LAB — URINE CULTURE
MICRO NUMBER:: 450213
SPECIMEN QUALITY:: ADEQUATE

## 2018-12-28 LAB — HIV ANTIBODY (ROUTINE TESTING W REFLEX): HIV 1&2 Ab, 4th Generation: NONREACTIVE

## 2018-12-28 LAB — ANA: Anti Nuclear Antibody (ANA): NEGATIVE

## 2018-12-28 LAB — IMMUNOFIXATION ELECTROPHORESIS
IgG (Immunoglobin G), Serum: 967 mg/dL (ref 600–1640)
IgM, Serum: 100 mg/dL (ref 50–300)
Immunofix Electr Int: NOT DETECTED
Immunoglobulin A: 157 mg/dL (ref 47–310)

## 2018-12-28 LAB — LACTATE DEHYDROGENASE: LDH: 118 U/L (ref 100–200)

## 2018-12-28 LAB — RHEUMATOID FACTOR: Rheumatoid fact SerPl-aCnc: <14 IU/mL (ref <14)

## 2018-12-31 ENCOUNTER — Encounter: Payer: Self-pay | Admitting: Family Medicine

## 2018-12-31 DIAGNOSIS — R509 Fever, unspecified: Secondary | ICD-10-CM

## 2018-12-31 DIAGNOSIS — M2550 Pain in unspecified joint: Secondary | ICD-10-CM

## 2019-01-08 ENCOUNTER — Encounter: Payer: Self-pay | Admitting: Family Medicine

## 2019-01-08 ENCOUNTER — Encounter: Payer: Self-pay | Admitting: Infectious Diseases

## 2019-01-08 ENCOUNTER — Ambulatory Visit (INDEPENDENT_AMBULATORY_CARE_PROVIDER_SITE_OTHER): Payer: PRIVATE HEALTH INSURANCE | Admitting: Family Medicine

## 2019-01-08 ENCOUNTER — Ambulatory Visit: Payer: PRIVATE HEALTH INSURANCE | Admitting: Infectious Diseases

## 2019-01-08 ENCOUNTER — Other Ambulatory Visit: Payer: Self-pay

## 2019-01-08 VITALS — Temp 99.7°F

## 2019-01-08 VITALS — BP 138/93 | HR 97 | Temp 98.5°F | Wt 245.0 lb

## 2019-01-08 DIAGNOSIS — M25542 Pain in joints of left hand: Secondary | ICD-10-CM | POA: Diagnosis not present

## 2019-01-08 DIAGNOSIS — M25541 Pain in joints of right hand: Secondary | ICD-10-CM

## 2019-01-08 DIAGNOSIS — R509 Fever, unspecified: Secondary | ICD-10-CM | POA: Diagnosis not present

## 2019-01-08 DIAGNOSIS — R5381 Other malaise: Secondary | ICD-10-CM

## 2019-01-08 DIAGNOSIS — M2550 Pain in unspecified joint: Secondary | ICD-10-CM | POA: Diagnosis not present

## 2019-01-08 NOTE — Patient Instructions (Addendum)
See Infectious Disease Specialist today.  Please call the Rheumatology office and I will have my office call as well.  See the orthopedic specialist as planned.  Would consider the following with the specialists or let me know if you would like me to order: lyme testing, CT chest/abd/pelvis, heart echo, blood cultures, COVID antibody testing, LP, imaging of the joints/possible tap if fluid and oncology evaluation.

## 2019-01-08 NOTE — Progress Notes (Signed)
Virtual Visit via Video Note  I connected with Diane Scott  on 01/08/19 at 11:20 AM EDT by a video enabled telemedicine application and verified that I am speaking with the correct person using two identifiers.  Location patient: home Location provider:work or home office Persons participating in the virtual visit: patient, provider  I discussed the limitations of evaluation and management by telemedicine and the availability of in person appointments. The patient expressed understanding and agreed to proceed.   HPI:  Follow up fevers: -ongoing now since March -she was presumptively diagnosed with COVID19 on the onset by outside clinic -had resp symptoms -now lowgrade fevers usually at night, joint pains in fingers, knees and ankles at times (but mainly in the hands), tired at the end of the day and HA at the end of the day -fever 100.7 last night -temp currently with forehead thermometer 98.5, reports the only other thermometer she could get online is oral/axillary and it reads all over the place from 102 to 97 in the same minute -no cough, rashes, diarrhea, nausea, CP, redness or swelling, LAD, vomiting, SOB (except on steps) -husband and children had the same and husband ran fever for 45 days and his has finally subsided -she has not been taking tylenol or analgesic other then rarely -she reports she really feels well enough to return to work and is frustrated that she can't because of the  -long list of labs and other studies unrevealing, she is seeing ortho this week and ID today, she also will see rheumatologist   ROS: See pertinent positives and negatives per HPI.  Past Medical History:  Diagnosis Date  . Arthritis   . Cystocele   . Depression   . Dysplasia of skin   . Gene mutation 10/05/2017   On screening for hemochromatosis due to [place] 2018 "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 Results for S65C were negative. The  mutations analyzed by [facility] are most  common in the Caucasian  population. Although some patients with this genotype experience  biochemically defined abnormalities of iron overload, the penetrance  for clinical symptoms, such as cirrhosis, cardiomyopa  . GERD (gastroesophageal reflux disease)   . Migraine headache with aura 2012   hx numb face with migraine  . Obesity   . Osteoarthritis of both knees   . Tubular adenoma 05/12/2009    Past Surgical History:  Procedure Laterality Date  . ANTERIOR AND POSTERIOR REPAIR N/A 09/22/2015   Procedure: ANTERIOR (CYSTOCELE) AND POSTERIOR REPAIR (RECTOCELE) ;  Surgeon: Nunzio Cobbs, MD;  Location: [facility];  Service: Gynecology;  Laterality: N/A;  . BILATERAL SALPINGECTOMY Bilateral 01/12/2015   Procedure: BILATERAL SALPINGECTOMY;  Surgeon: Megan Salon, MD;  Location: [facility];  Service: Gynecology;  Laterality: Bilateral;  . BLADDER SUSPENSION N/A 09/22/2015   Procedure: TRANSVAGINAL TAPE (TVT) PROCEDURE;  Surgeon: Nunzio Cobbs, MD;  Location: [facility];  Service: Gynecology;  Laterality: N/A;  . CHOLECYSTECTOMY  2008  . COLONOSCOPY    . CYSTOSCOPY N/A 01/12/2015   Procedure: CYSTOSCOPY;  Surgeon: Megan Salon, MD;  Location: [facility];  Service: Gynecology;  Laterality: N/A;  . CYSTOSCOPY N/A 09/22/2015   Procedure: CYSTOSCOPY;  Surgeon: Nunzio Cobbs, MD;  Location: [facility];  Service: Gynecology;  Laterality: N/A;  . endometrial biopsy     neg per patient  . LEG SKIN LESION  BIOPSY / EXCISION Left 01/20/2015   [facility]-moderate dysplasia per  patient  . ORIF FINGER FRACTURE    . ROBOTIC ASSISTED TOTAL HYSTERECTOMY N/A 01/12/2015   Procedure: ROBOTIC ASSISTED TOTAL HYSTERECTOMY;  Surgeon: Megan Salon, MD;  Location: [facility];  Service: Gynecology;  Laterality: N/A;  . TUBAL LIGATION    . uterine ablation  2/13    Family History  Problem Relation Age of Onset  . Arthritis Mother   . Hypertension Mother   . Hyperlipidemia Mother   . Stroke  Mother   . Arthritis Father   . Hyperlipidemia Father   . Hypertension Father   . Diabetes Father   . Melanoma Maternal Aunt   . Melanoma Paternal Uncle   . Arthritis Maternal Grandmother   . Alcohol abuse Maternal Grandfather   . Squamous cell carcinoma Maternal Grandfather   . Arthritis Paternal Grandmother   . Melanoma Paternal Grandmother   . Alcohol abuse Paternal Grandfather   . Squamous cell carcinoma Maternal Aunt   . Colon cancer Paternal Uncle   . Liver cancer Paternal Uncle     SOCIAL HX: see hpi   Current Outpatient Medications:  .  cholecalciferol (VITAMIN D3) 25 MCG (1000 UT) tablet, Take 1,000 Units by mouth daily., Disp: , Rfl:  .  Diclofenac Sodium (PENNSAID) 2 % SOLN, Place 1 application onto the skin 2 (two) times daily., Disp: 112 g, Rfl: 2 .  lamoTRIgine (LAMICTAL) 100 MG tablet, Take 1 tablet by mouth daily., Disp: , Rfl:  .  methylphenidate (RITALIN) 10 MG tablet, Take 20 mg by mouth 3 (three) times daily with meals. , Disp: , Rfl:   EXAM:  VITALS per patient if applicable:  Today's Vitals   01/08/19 1104  Temp: 99.7 F (37.6 C)  SpO2: 97%   There is no height or weight on file to calculate BMI.   GENERAL: alert, oriented, appears well and in no acute distress  HEENT: atraumatic, conjunttiva clear, no obvious abnormalities on inspection of external nose and ears, moist mucus membraines  NECK: normal movements of the head and neck  LUNGS: on inspection no signs of respiratory distress, breathing rate appears normal, no obvious gross SOB, gasping or wheezing  CV: no obvious cyanosis  MS: moves all visible extremities without noticeable abnormality, no swelling or redness of hand foints  PSYCH/NEURO: pleasant and cooperative, no obvious depression or anxiety, speech and thought processing grossly intact  ASSESSMENT AND PLAN:  Discussed the following assessment and plan:  Fever, unspecified fever cause  Polyarthralgia  Malaise  -we  discussed possible serious and likely etiologies, workup and treatment, treatment risks and return precautions. Have suggested further labs with cultures, lyme testing, CT c/a/p, echo, further imaging joints, possible tap if effusions, possible LP since headaches and she had opted to hold off until seeing specialist. She sees ID today and agrees to call me for these orders if not placed by ID if they feel are warranted. She has seen on forums that Caspian can last this long and that is uncharted waters, so I am not sure, with reports of post infectious inflammatory conditions in children did advise wonder if this could be the case. Discussed possible antibody testing, limitations of this test, along with the other tests above. She plans to discuss with ID. She also plans to see rheumatology. She agrees to consider onc eval once sees ID. -follow up 1 month -of course, we advised Lacy  to return or notify a doctor immediately if symptoms worsen or persist or new concerns arise.   I  discussed the assessment and treatment plan with the patient. The patient was provided an opportunity to ask questions and all were answered. The patient agreed with the plan and demonstrated an understanding of the instructions.   The patient was advised to call back or seek an in-person evaluation if the symptoms worsen or if the condition fails to improve as anticipated.   Follow up instructions: Advised assistant Wendie Simmer to help patient arrange the following: -follow up 2 weeks Dr. Dimple Casey, DO

## 2019-01-08 NOTE — Patient Instructions (Signed)
Begin naprosyn suppression test with 375 mg PO BID for 3 days. Check temperature at least every 8 hrs while on suppression test.

## 2019-01-08 NOTE — Progress Notes (Signed)
Subjective:    Patient ID: Diane Scott, female    DOB: 1976-01-10, 43 y.o.   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  She states that she initially developed myalgias and a mild cough along with fever the onset of her illness.  As she began to experience a wave of improvement, her husband and then her later her 2 daughters began developing similar symptoms as well.  Peculiarly, she denies any shortness of breath or dyspnea on exertion as a component of her symptoms.  She does admit to significant night sweats that occur approximately every 4 to 5 days.  She has not had any significant weight loss during this time but does admit to it decreased appetite.  When the fevers peaking, her only associated symptom is profound fatigue/malaise and occasionally myalgias.  She does take ritalin for ADD per guidance of her psychiatrist.  Within the last several weeks, she has had 2 COVID tests both of which were negative.  She did have a chest x-ray performed several weeks ago that showed no active infiltrates or other cardiopulmonary disease.  Her routine laboratories including CBC and CMP have been within normal limits and even a d-dimer was also normal on 2 checks approximately 1 week apart from 1 another.  She denies any Mediterranean ancestry, but does admit she has had increasing issues with small joint pain over the last several months to a year prior to onset of these daily fevers.   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 Results for S65C were negative. The  mutations analyzed by [facility] are most common in the Caucasian  population. Although some patients with this genotype experience  biochemically defined abnormalities of iron overload, the penetrance  for clinical symptoms, such as cirrhosis, cardiomyopa   GERD (gastroesophageal reflux disease)    Migraine headache with aura 2012   hx numb face with migraine   Obesity    Osteoarthritis of both knees    Tubular adenoma 05/12/2009    Past Surgical History:  Procedure Laterality Date   ANTERIOR AND POSTERIOR REPAIR N/A 09/22/2015   Procedure: ANTERIOR (CYSTOCELE) AND POSTERIOR REPAIR (RECTOCELE) ;  Surgeon: Nunzio Cobbs, MD;  Location: [facility];  Service: Gynecology;  Laterality: N/A;   BILATERAL SALPINGECTOMY Bilateral 01/12/2015   Procedure: BILATERAL SALPINGECTOMY;  Surgeon: Megan Salon, MD;  Location: [facility];  Service: Gynecology;  Laterality: Bilateral;   BLADDER SUSPENSION N/A 09/22/2015   Procedure: TRANSVAGINAL TAPE (TVT) PROCEDURE;  Surgeon: Nunzio Cobbs, MD;  Location: [facility];  Service: Gynecology;  Laterality: N/A;   CHOLECYSTECTOMY  2008   COLONOSCOPY     CYSTOSCOPY N/A 01/12/2015   Procedure: CYSTOSCOPY;  Surgeon: Megan Salon, MD;  Location: [facility];  Service: Gynecology;  Laterality: N/A;   CYSTOSCOPY N/A 09/22/2015   Procedure: CYSTOSCOPY;  Surgeon: Nunzio Cobbs, MD;  Location: [facility];  Service: Gynecology;  Laterality: N/A;   endometrial biopsy     neg per patient   LEG SKIN LESION  BIOPSY / EXCISION Left 01/20/2015   [facility]-moderate dysplasia per patient   ORIF FINGER FRACTURE     ROBOTIC ASSISTED TOTAL HYSTERECTOMY N/A 01/12/2015   Procedure: ROBOTIC ASSISTED TOTAL HYSTERECTOMY;  Surgeon: Megan Salon, MD;  Location: [facility];  Service: Gynecology;  Laterality: N/A;   TUBAL LIGATION     uterine ablation  2/13     Family History  Problem Relation Age of  Onset   Arthritis Mother    Hypertension Mother    Hyperlipidemia Mother    Stroke Mother    Arthritis Father    Hyperlipidemia Father    Hypertension Father    Diabetes Father    Melanoma Maternal Aunt    Melanoma Paternal Uncle    Arthritis Maternal Grandmother    Alcohol abuse Maternal Grandfather    Squamous cell carcinoma Maternal Grandfather    Arthritis Paternal Grandmother    Melanoma Paternal Grandmother    Alcohol abuse Paternal Grandfather    Squamous cell carcinoma Maternal Aunt    Colon cancer Paternal Uncle    Liver cancer Paternal Uncle      Social History   Tobacco Use   Smoking status: Former Smoker    Last attempt to quit: 09/22/2000    Years since quitting: 18.3   Smokeless tobacco: Never Used  Substance Use Topics   Alcohol use: Yes    Alcohol/week: 0.0 - 2.0 standard drinks   Drug use: No      reports being sexually active and has had partner(s) who are Female. She reports using the following method of birth control/protection: Surgical.   Outpatient Medications Prior to Visit  Medication Sig Dispense Refill   cholecalciferol (VITAMIN D3) 25 MCG (1000 UT) tablet Take 1,000 Units by mouth daily.     Diclofenac Sodium (PENNSAID) 2 % SOLN Place 1 application onto the skin 2 (two) times daily. 112 g 2   lamoTRIgine (LAMICTAL) 100 MG tablet Take 1 tablet by mouth daily.     methylphenidate (RITALIN) 10 MG tablet Take 20 mg by mouth 3 (three) times daily with meals.      No facility-administered medications prior to visit.      No Known Allergies    Review of Systems  Constitutional: Positive for fatigue and fever. Negative for chills.  HENT: Negative for congestion, hearing loss and sinus pressure.   Eyes: Negative for photophobia, discharge, redness and visual disturbance.  Respiratory: Negative for apnea, cough, shortness of breath and wheezing.   Cardiovascular: Negative for chest pain and leg swelling.    Gastrointestinal: Negative for abdominal distention, abdominal pain, constipation, diarrhea, nausea and vomiting.  Endocrine: Negative for cold intolerance, heat intolerance, polydipsia and polyuria.  Genitourinary: Negative for dysuria, flank pain, frequency, urgency, vaginal bleeding and vaginal discharge.  Musculoskeletal: Positive for myalgias. Negative for arthralgias, back pain, joint swelling and neck pain.  Skin: Negative for pallor and rash.  Allergic/Immunologic: Negative for immunocompromised state.  Neurological: Positive for headaches. Negative for dizziness, seizures, speech difficulty and weakness.  Hematological: Does not bruise/bleed easily.  Psychiatric/Behavioral: Negative for agitation, confusion, hallucinations and sleep disturbance. The patient is not nervous/anxious.        Objective:    Vitals:   01/08/19 1507  BP: (!) 138/93  Pulse: 97  Temp: 98.5 F (36.9 C)   Physical Exam Gen: pleasant, anxious, NAD, A&Ox 3 Head:  NCAT, no temporal wasting evident EENT: PERRL, EOMI, MMM, adequate dentition Neck: supple, no JVD CV: NRRR, no murmurs evident Pulm: CTA bilaterally, no wheeze or retractions Abd: soft, NTND, +BS Extrems: trace LE edema, 2+ pulses Skin: no rashes, adequate skin turgor Neuro: CN II-XII grossly intact, no focal neurologic deficits appreciated, gait was normal, A&Ox 3   Labs: Lab Results  Component Value Date   WBC 12.0 (H) 12/26/2018   HGB 13.7 12/26/2018   HCT 40.9 12/26/2018   MCV 88.1 12/26/2018   PLT 263.0 12/26/2018   Lab Results  Component Value Date   NA 136 12/26/2018   K 4.1 12/26/2018   CL 102 12/26/2018   CO2 24 12/26/2018   GLUCOSE 89 12/26/2018   BUN 17 12/26/2018   CREATININE 0.69 12/26/2018   CALCIUM 9.1 12/26/2018   Lab Results  Component Value Date   CRP <1.0 12/26/2018       Assessment & Plan:  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  Fever of unknown origin -with a 2- PCR test for COVID-19, it is safe to say that the patient does not have an active COVID-19 that would preclude her being able to return to work.  While in consideration for COVID-19 antibody testing might be reasonable, this would not allow [place] to draw any conclusions of this to whether this is related to her current prolonged fever for nearly every day for the past 2 months.  We will check blood cultures x2 today to rule out transient bacteremia.  Additionally, will check a transthoracic echocardiogram to rule out for subacute endocarditis.  The patient does not have Mediterranean ancestry to raise concern for Mediterranean familial relapsing fever syndrome or amyloidosis.  I have suggested the patient begin a Naprosyn suppression challenge taking Naprosyn 375 mg p.o. twice daily for the next 72 hours to assess for a potentially oncologic source of her fever.  If her fever completely suppresses with the use of Naprosyn, then this argues that she may have an oncologic origin to her fever.  If she has a partial response to her fever curve with the use of Naprosyn over this timeframe, this would be suggestive of an autoimmune disease.  Considerations for causes of her fever other than infection, should be heavily considered his most infectious fevers will last for only 3 weeks and then self abort.  And the prolonged duration of fever that she has had, she has only had 3 to 5% chance of having an infectious cause of her fever.  For this reason, I cannot advise an exhaustive effort to find further infections as this would be all low yield.  Lastly, drug fever also remains a possibility so I would encourage her primary care physician to have a thorough review of all her medications.  Her medications for ADD may need to be readjusted even though she has had no change in dose in the last several months per her report.  These  agents occasionally can be pyrogenic however with chronic use.  Clinically, the most likely possibility appears to be possibly either seronegative rheumatoid arthritis or a mixed connective tissue disorder.  For this reason, I agree with the patient being referred to rheumatology and I am curious of their thoughts regarding her condition.  I spent well over 55 minutes in evaluating the patient discussing her history and gathering further information to formulate a  plan.

## 2019-01-10 ENCOUNTER — Ambulatory Visit: Payer: PRIVATE HEALTH INSURANCE | Admitting: Family Medicine

## 2019-01-15 NOTE — Progress Notes (Deleted)
Office Visit Note  Patient: Diane Scott             Date of Birth: 18-Feb-1976           MRN: [id number]             PCP: Lucretia Kern, DO Referring: Lucretia Kern, DO Visit Date: 01/28/2019 Occupation: @GUAROCC @  Subjective:  No chief complaint on file.   History of Present Illness: Breyon A Simek is a 43 y.o. female ***   Activities of Daily Living:  Patient reports morning stiffness for *** {minute/hour:19697}.   Patient {ACTIONS;DENIES/REPORTS:21021675::"Denies"} nocturnal pain.  Difficulty dressing/grooming: {ACTIONS;DENIES/REPORTS:21021675::"Denies"} Difficulty climbing stairs: {ACTIONS;DENIES/REPORTS:21021675::"Denies"} Difficulty getting out of chair: {ACTIONS;DENIES/REPORTS:21021675::"Denies"} Difficulty using hands for taps, buttons, cutlery, and/or writing: {ACTIONS;DENIES/REPORTS:21021675::"Denies"}  No Rheumatology ROS completed.   PMFS History:  Patient Active Problem List   Diagnosis Date Noted  . Bilateral hand pain 10/13/2017  . 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 BMI 40.0-44.9, adult ([place]) 10/05/2017  . Stress incontinence 11/20/2014  . OA (osteoarthritis) of knee 12/26/2013  . Cystocele, lateral 12/23/2010  . Headache, migraine 11/11/2010  . Atypical nevus 10/05/2009  . Clinical depression 10/05/2009  . Carpal tunnel syndrome 10/05/2009  . Acid reflux 05/12/2009    Past Medical History:  Diagnosis Date  . Arthritis   . Cystocele   . Depression   . Dysplasia of skin   . Gene mutation 10/05/2017   On screening for hemochromatosis due to [place] 2018 "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 Results for S65C were negative. The  mutations analyzed by [facility] are most common in the Caucasian  population. Although some patients with this genotype experience  biochemically defined abnormalities of iron overload, the penetrance  for clinical symptoms, such as cirrhosis, cardiomyopa  . GERD (gastroesophageal reflux disease)   . Migraine headache  with aura 2012   hx numb face with migraine  . Obesity   . Osteoarthritis of both knees   . Tubular adenoma 05/12/2009    Family History  Problem Relation Age of Onset  . Arthritis Mother   . Hypertension Mother   . Hyperlipidemia Mother   . Stroke Mother   . Arthritis Father   . Hyperlipidemia Father   . Hypertension Father   . Diabetes Father   . Melanoma Maternal Aunt   . Melanoma Paternal Uncle   . Arthritis Maternal Grandmother   . Alcohol abuse Maternal Grandfather   . Squamous cell carcinoma Maternal Grandfather   . Arthritis Paternal Grandmother   . Melanoma Paternal Grandmother   . Alcohol abuse Paternal Grandfather   . Squamous cell carcinoma Maternal Aunt   . Colon cancer Paternal Uncle   . Liver cancer Paternal Uncle    Past Surgical History:  Procedure Laterality Date  . ANTERIOR AND POSTERIOR REPAIR N/A 09/22/2015   Procedure: ANTERIOR (CYSTOCELE) AND POSTERIOR REPAIR (RECTOCELE) ;  Surgeon: Nunzio Cobbs, MD;  Location: [facility];  Service: Gynecology;  Laterality: N/A;  . BILATERAL SALPINGECTOMY Bilateral 01/12/2015   Procedure: BILATERAL SALPINGECTOMY;  Surgeon: Megan Salon, MD;  Location: [facility];  Service: Gynecology;  Laterality: Bilateral;  . BLADDER SUSPENSION N/A 09/22/2015   Procedure: TRANSVAGINAL TAPE (TVT) PROCEDURE;  Surgeon: Nunzio Cobbs, MD;  Location: [facility];  Service: Gynecology;  Laterality: N/A;  . CHOLECYSTECTOMY  2008  . COLONOSCOPY    . CYSTOSCOPY N/A 01/12/2015   Procedure: CYSTOSCOPY;  Surgeon: Megan Salon, MD;  Location: [facility];  Service: Gynecology;  Laterality: N/A;  . CYSTOSCOPY N/A 09/22/2015   Procedure: CYSTOSCOPY;  Surgeon: Nunzio Cobbs, MD;  Location: [facility];  Service: Gynecology;  Laterality: N/A;  . endometrial biopsy     neg per patient  . LEG SKIN LESION  BIOPSY / EXCISION Left 01/20/2015   [facility]-moderate dysplasia per patient  . ORIF FINGER FRACTURE    . ROBOTIC ASSISTED  TOTAL HYSTERECTOMY N/A 01/12/2015   Procedure: ROBOTIC ASSISTED TOTAL HYSTERECTOMY;  Surgeon: Megan Salon, MD;  Location: [facility];  Service: Gynecology;  Laterality: N/A;  . TUBAL LIGATION    . uterine ablation  2/13   Social History   Social History Narrative   Work or School: NP heaptology      Home Situation: lives with husband and 2 children      Spiritual Beliefs: none      Lifestyle: elliptical; working on diet            Immunization History  Administered Date(s) Administered  . Influenza-Unspecified 05/22/2014, 06/05/2015, 05/22/2016, 05/25/2017  . PPD Test 10/10/2007  . Tdap 08/23/2011, 10/20/2011     Objective: Vital Signs: LMP 12/24/2014    Physical Exam   Musculoskeletal Exam: ***  CDAI Exam: CDAI Score: Not documented Patient Global Assessment: Not documented; Provider Global Assessment: Not documented Swollen: Not documented; Tender: Not documented Joint Exam   Not documented   There is currently no information documented on the homunculus. Go to the Rheumatology activity and complete the homunculus joint exam.  Investigation: Findings:  12/26/18: ANA negative, RF<14, sed rate 21, CRP <1, LDH 118, Immunoglobulins WNL, IFE no monoclonal proteins   Component     Latest Ref Rng & Units 12/26/2018  Total Protein     6.1 - 8.1 g/dL 6.8  Albumin ELP     3.8 - 4.8 g/dL 3.9  Alpha 1     0.2 - 0.3 g/dL 0.3  Alpha 2     0.5 - 0.9 g/dL 0.7  Beta Globulin     0.4 - 0.6 g/dL 0.5  Beta 2     0.2 - 0.5 g/dL 0.4  Gamma Globulin     0.8 - 1.7 g/dL 0.9  SPE Interp.        Immunofix Electr Int      NO MONOCLONAL PROTEIN DETECTED  Immunoglobulin A     47 - 310 mg/dL 157  IgG (Immunoglobin G), Serum     600 - 1,640 mg/dL 967  IgM, Serum     50 - 300 mg/dL 100  Sed Rate     0 - 20 mm/hr 21 (H)  CRP     0.5 - 20.0 mg/dL <1.0  LDH     100 - 200 U/L 118  RA Latex Turbid.     <14 IU/mL <14  Anti Nuclear Antibody (ANA)     NEGATIVE NEGATIVE    Imaging: No results found.  Recent Labs: Lab Results  Component Value Date   WBC 12.0 (H) 12/26/2018   HGB 13.7 12/26/2018   PLT 263.0 12/26/2018   NA 136 12/26/2018   K 4.1 12/26/2018   CL 102 12/26/2018   CO2 24 12/26/2018   GLUCOSE 89 12/26/2018   BUN 17 12/26/2018   CREATININE 0.69 12/26/2018   BILITOT 0.3 12/26/2018   ALKPHOS 64 12/26/2018   AST 14 12/26/2018   ALT 18 12/26/2018   PROT 6.7 12/26/2018   PROT 6.8 12/26/2018  ALBUMIN 4.2 12/26/2018   CALCIUM 9.1 12/26/2018   GFRAA >60 12/11/2018    Speciality Comments: No specialty comments available.  Procedures:  No procedures performed Allergies: Patient has no known allergies.   Assessment / Plan:     Visit Diagnoses: No diagnosis found.   Orders: No orders of the defined types were placed in this encounter.  No orders of the defined types were placed in this encounter.   Face-to-face time spent with patient was *** minutes. Greater than 50% of time was spent in counseling and coordination of care.  Follow-Up Instructions: No follow-ups on file.   Ofilia Neas, PA-C  Note - This record has been created using Dragon software.  Chart creation errors have been sought, but may not always  have been located. Such creation errors do not reflect on  the standard of medical care.

## 2019-01-17 LAB — "CULTURE BLOOD MANUAL "
Micro Number: 499927
Micro Number: 499930
Result: NO GROWTH
Result: NO GROWTH
Specimen Quality: ADEQUATE
Specimen Quality: ADEQUATE

## 2019-01-21 ENCOUNTER — Other Ambulatory Visit: Payer: Self-pay

## 2019-01-21 ENCOUNTER — Ambulatory Visit (HOSPITAL_COMMUNITY)
Admission: RE | Admit: 2019-01-21 | Discharge: 2019-01-21 | Disposition: A | Discharge status: Home / Self Care | Payer: PRIVATE HEALTH INSURANCE | Source: Ambulatory Visit | Attending: Infectious Diseases | Admitting: Infectious Diseases

## 2019-01-21 DIAGNOSIS — R509 Fever, unspecified: Secondary | ICD-10-CM | POA: Insufficient documentation

## 2019-01-21 NOTE — Progress Notes (Signed)
  Echocardiogram 2D Echocardiogram has been performed.  Diane Scott 01/21/2019, 3:48 PM

## 2019-01-22 ENCOUNTER — Encounter: Payer: Self-pay | Admitting: Family Medicine

## 2019-01-22 ENCOUNTER — Telehealth: Payer: Self-pay | Admitting: *Deleted

## 2019-01-22 ENCOUNTER — Ambulatory Visit (INDEPENDENT_AMBULATORY_CARE_PROVIDER_SITE_OTHER): Payer: PRIVATE HEALTH INSURANCE | Admitting: Family Medicine

## 2019-01-22 VITALS — Temp 98.9°F

## 2019-01-22 DIAGNOSIS — M25532 Pain in left wrist: Secondary | ICD-10-CM

## 2019-01-22 DIAGNOSIS — R030 Elevated blood-pressure reading, without diagnosis of hypertension: Secondary | ICD-10-CM | POA: Diagnosis not present

## 2019-01-22 DIAGNOSIS — R509 Fever, unspecified: Secondary | ICD-10-CM

## 2019-01-22 DIAGNOSIS — M25531 Pain in right wrist: Secondary | ICD-10-CM

## 2019-01-22 DIAGNOSIS — R5381 Other malaise: Secondary | ICD-10-CM

## 2019-01-22 NOTE — Progress Notes (Signed)
Virtual Visit via Video Note  I connected with Diane Scott  on 01/22/19 at 11:00 AM EDT by a video enabled telemedicine application and verified that I am speaking with the correct person using two identifiers.  Location patient: home Location provider:work or home office Persons participating in the virtual visit: patient, provider  I discussed the limitations of evaluation and management by telemedicine and the availability of in person appointments. The patient expressed understanding and agreed to proceed.   HPI:  Reports continues to have fevers and fatigue nightly. 100.8 last night. Reports failed naproxen challenge from ID, Dr. Prince Rome. Reports has not heard back from ID. She wants recs from ID in terms of return to work, but is still having fevers. Also reports she is waiting on ID for echo results and wether or not to get the CT C/A/P. Reports was told Lyme disease is not in this area so testing for this was not advised by ID.  Seeing Rheumatology next week. Still some wrist pains.  ROS: See pertinent positives and negatives per HPI.  Past Medical History:  Diagnosis Date  . Arthritis   . Cystocele   . Depression   . Dysplasia of skin   . Gene mutation 10/05/2017   On screening for hemochromatosis due to [place] 2018 "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 Results for S65C were negative. The  mutations analyzed by [facility] are most common in the Caucasian  population. Although some patients with this genotype experience  biochemically defined abnormalities of iron overload, the penetrance  for clinical symptoms, such as cirrhosis, cardiomyopa  . GERD (gastroesophageal reflux disease)   . Migraine headache with aura 2012   hx numb face with migraine  . Obesity   . Osteoarthritis of both knees   . Tubular adenoma 05/12/2009    Past Surgical History:  Procedure Laterality Date  . ANTERIOR AND POSTERIOR REPAIR N/A 09/22/2015   Procedure: ANTERIOR (CYSTOCELE) AND  POSTERIOR REPAIR (RECTOCELE) ;  Surgeon: Nunzio Cobbs, MD;  Location: [facility];  Service: Gynecology;  Laterality: N/A;  . BILATERAL SALPINGECTOMY Bilateral 01/12/2015   Procedure: BILATERAL SALPINGECTOMY;  Surgeon: Megan Salon, MD;  Location: [facility];  Service: Gynecology;  Laterality: Bilateral;  . BLADDER SUSPENSION N/A 09/22/2015   Procedure: TRANSVAGINAL TAPE (TVT) PROCEDURE;  Surgeon: Nunzio Cobbs, MD;  Location: [facility];  Service: Gynecology;  Laterality: N/A;  . CHOLECYSTECTOMY  2008  . COLONOSCOPY    . CYSTOSCOPY N/A 01/12/2015   Procedure: CYSTOSCOPY;  Surgeon: Megan Salon, MD;  Location: [facility];  Service: Gynecology;  Laterality: N/A;  . CYSTOSCOPY N/A 09/22/2015   Procedure: CYSTOSCOPY;  Surgeon: Nunzio Cobbs, MD;  Location: [facility];  Service: Gynecology;  Laterality: N/A;  . endometrial biopsy     neg per patient  . LEG SKIN LESION  BIOPSY / EXCISION Left 01/20/2015   [facility]-moderate dysplasia per patient  . ORIF FINGER FRACTURE    . ROBOTIC ASSISTED TOTAL HYSTERECTOMY N/A 01/12/2015   Procedure: ROBOTIC ASSISTED TOTAL HYSTERECTOMY;  Surgeon: Megan Salon, MD;  Location: [facility];  Service: Gynecology;  Laterality: N/A;  . TUBAL LIGATION    . uterine ablation  2/13    Family History  Problem Relation Age of Onset  . Arthritis Mother   . Hypertension Mother   . Hyperlipidemia Mother   . Stroke Mother   . Arthritis Father   . Hyperlipidemia Father   .  Hypertension Father   . Diabetes Father   . Melanoma Maternal Aunt   . Melanoma Paternal Uncle   . Arthritis Maternal Grandmother   . Alcohol abuse Maternal Grandfather   . Squamous cell carcinoma Maternal Grandfather   . Arthritis Paternal Grandmother   . Melanoma Paternal Grandmother   . Alcohol abuse Paternal Grandfather   . Squamous cell carcinoma Maternal Aunt   . Colon cancer Paternal Uncle   . Liver cancer Paternal Uncle     SOCIAL HX: see hpi   Current  Outpatient Medications:  .  cholecalciferol (VITAMIN D3) 25 MCG (1000 UT) tablet, Take 1,000 Units by mouth daily., Disp: , Rfl:  .  Diclofenac Sodium (PENNSAID) 2 % SOLN, Place 1 application onto the skin 2 (two) times daily., Disp: 112 g, Rfl: 2 .  lamoTRIgine (LAMICTAL) 100 MG tablet, Take 1 tablet by mouth daily., Disp: , Rfl:  .  methylphenidate (RITALIN) 10 MG tablet, Take 20 mg by mouth 3 (three) times daily with meals. , Disp: , Rfl:   EXAM:  VITALS per patient if applicable:temp 397.6 last night  GENERAL: alert, oriented, appears well and in no acute distress  HEENT: atraumatic, conjunttiva clear, no obvious abnormalities on inspection of external nose and ears  NECK: normal movements of the head and neck  LUNGS: on inspection no signs of respiratory distress, breathing rate appears normal, no obvious gross SOB, gasping or wheezing  CV: no obvious cyanosis  MS: moves all visible extremities without noticeable abnormality  PSYCH/NEURO: pleasant and cooperative, no obvious depression or anxiety, speech and thought processing grossly intact  ASSESSMENT AND PLAN:  Discussed the following assessment and plan:  Fever, unspecified fever cause  Pain of both wrist joints  Malaise  Has had pretty extensive evaluation. Seeing ID. Also rheum in less then one week. offered to order CT c/a/p. Also offered referral to cardiology for trivial pericardial effusion on echo - o/w per report not much. She would like to run this by ID and talk with rheum. She also asked about antibody testing for covid. Discussed limitations. Advised against for her given still febrile. Her husband had fevers for a number of weeks, but now has been afebrile for several weeks and he may consider antibody testing with [facility], but does not really help her much with her ongoing situation. I'll reach out to ID about next steps and return to work from an infectious standpoint and she will see rheumatology. She also has  had some elevated BP readings. Dicussed and she has opted for some home monitoring. Follow up in 1 month, may need treatment if remains elevated. She agrees to call if needs anything further from me in terms of the fevers.   I discussed the assessment and treatment plan with the patient. The patient was provided an opportunity to ask questions and all were answered. The patient agreed with the plan and demonstrated an understanding of the instructions.   The patient was advised to call back or seek an in-person evaluation if the symptoms worsen or if the condition fails to improve as anticipated.   Follow up instructions: Advised assistant Wendie Simmer to help patient arrange the following: -follow up BP with Dr. Maudie Mercury in 1 month -BP cuff order if she wishes  Lucretia Kern, DO

## 2019-01-22 NOTE — Telephone Encounter (Signed)
-----   Message from Lucretia Kern, DO sent at 01/22/2019 11:40 AM EDT ----- -follow up BP with Dr. Maudie Mercury in 1 month -BP cuff order if she wishes

## 2019-01-22 NOTE — Patient Instructions (Signed)
Please see rheumatology as planned.  Reach out to Infectious Disease regarding next steps and return to work. I sent Dr. Prince Rome a message as well. Let us know if we can help in any way.  Get a blood pressure cuff and monitor, keep a log. Follow up in 1 month.

## 2019-01-22 NOTE — Telephone Encounter (Signed)
I called the pt and scheduled a follow up appt on 7/9.  Patient stated she will order a BP cuff from [facility] and does not need an RX as she has not met her deductible.

## 2019-01-24 NOTE — Progress Notes (Signed)
Office Visit Note  Patient: Diane Scott             Date of Birth: [birth year]           MRN: [id number]             PCP: Lucretia Kern, DO Referring: Lucretia Kern, DO Visit Date: 02/05/2019 Occupation: NP in liver care  Subjective:  New Patient (Initial Visit) (Joint pain, fever x 70 days, negative COVID 11/2018 1)   History of Present Illness: Diane Scott is a 43 y.o. 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  She has been seen in consultation per request of her PCP.  According to patient her symptoms started several years ago with joint pain.  She recalls having right knee joint discomfort for about 10 years and then pain in multiple joints over the last 3 years.  She has been having pain in her bilateral hands, both ankles, and shoulders off and on.  She has had TMJ problems for which she uses mouthguard.  About 3 years ago she had an episode of nodular episcleritis for which she was given topical steroid with no recurrence of symptoms.  She also has had sicca symptoms for which she is given Restasis that did not help.  She had been seeing Dr. Paulla Fore for last 1-1/2 years for hand pain and discomfort.  She states she has trigger finger in both hands and he injected the right fifth trigger finger in the past.  He also advised her to use Pennsaid gel.  She seen him in the past for right tennis elbow for which she was given exercises and the symptoms resolved.  In February 2020 she had right wrist carpal tunnel syndrome injection which helped.  She states in March 2020 she developed fever shortness of breath and chest pain.  At the time she had test done for COVID which was negative.  She has had low-grade fever since then and has been lingering on for last 70 days now.  She has been evaluated by infectious disease and the work-up was negative.  She also had an echocardiogram which was negative.  She states all her autoimmune work-up was negative except for mild elevation in the  sedimentation rate.  Recently she has been experiencing increased fatigue and increased discomfort in her hands.  She states the hand pain could be so severe that it keeps her up at night.  Her right knee joint also continues to bother her.  There is no history of exposure to any infections.  She has 2 dogs she did not really have a tick bite but is concerned about possible tick related infections.  She has 2 children who are teenagers.  Activities of Daily Living:  Patient reports morning stiffness for 1 hour.   Patient Reports nocturnal pain.  Difficulty dressing/grooming: Reports Difficulty climbing stairs: Denies Difficulty getting out of chair: Denies Difficulty using hands for taps, buttons, cutlery, and/or writing: Reports  Review of Systems  Constitutional: Positive for fatigue. Negative for night sweats, weight gain and weight loss.  HENT: Positive for mouth dryness. Negative for mouth sores, trouble swallowing, trouble swallowing and nose dryness.   Eyes: Positive for dryness. Negative for pain, redness and visual disturbance.  Respiratory: Negative for cough, shortness of breath and difficulty breathing.   Cardiovascular: Positive for swelling in legs/feet. Negative for chest pain, palpitations, hypertension and irregular heartbeat.  Gastrointestinal: Negative for blood in stool, constipation and diarrhea.  Endocrine: Negative for increased urination.  Genitourinary: Negative for difficulty urinating and vaginal dryness.  Musculoskeletal: Positive for arthralgias, joint pain, joint swelling, muscle weakness and morning stiffness. Negative for myalgias, muscle tenderness and myalgias.  Skin: Negative for color change, rash, hair loss, skin tightness, ulcers and sensitivity to sunlight.  Allergic/Immunologic: Negative for susceptible to infections.  Neurological: Positive for numbness and weakness. Negative for dizziness, memory loss and night sweats.  Hematological: Negative for  bruising/bleeding tendency and swollen glands.  Psychiatric/Behavioral: Negative for depressed mood and sleep disturbance. The patient is not nervous/anxious.     PMFS History:  Patient Active Problem List   Diagnosis Date Noted   Bilateral hand pain 10/13/2017   Gene mutation 10/05/2017   BMI 40.0-44.9, adult ([place]) 10/05/2017   Stress incontinence 11/20/2014   OA (osteoarthritis) of knee 12/26/2013   Cystocele, lateral 12/23/2010   Headache, migraine 11/11/2010   Atypical nevus 10/05/2009   Clinical depression 10/05/2009   Carpal tunnel syndrome 10/05/2009   Acid reflux 05/12/2009    Past Medical History:  Diagnosis Date   Arthritis    Cystocele    Depression    Dysplasia of skin    Gene mutation 10/05/2017   On screening for hemochromatosis due to [place] 2018 "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 Results for S65C were negative. The  mutations analyzed by [facility] are most common in the Caucasian  population. Although some patients with this genotype experience  biochemically defined abnormalities of iron overload, the penetrance  for clinical symptoms, such as cirrhosis, cardiomyopa   GERD (gastroesophageal reflux disease)    Migraine headache with aura 2012   hx numb face with migraine   Obesity    Osteoarthritis of both knees    Tubular adenoma 05/12/2009    Family History  Problem Relation Age of Onset   Arthritis Mother    Hypertension Mother    Hyperlipidemia Mother    Stroke Mother    Arthritis Father    Hyperlipidemia Father    Hypertension Father    Diabetes Father    Melanoma Maternal Aunt    Melanoma Paternal Uncle    Arthritis Maternal Grandmother    Alcohol abuse Maternal Grandfather    Squamous cell carcinoma Maternal Grandfather    Arthritis Paternal Grandmother    Melanoma Paternal Grandmother    Alcohol abuse Paternal Grandfather    Squamous cell carcinoma Maternal Aunt    Colon cancer  Paternal Uncle    Liver cancer Paternal Uncle    Past Surgical History:  Procedure Laterality Date   ANTERIOR AND POSTERIOR REPAIR N/A 09/22/2015   Procedure: ANTERIOR (CYSTOCELE) AND POSTERIOR REPAIR (RECTOCELE) ;  Surgeon: Nunzio Cobbs, MD;  Location: [facility];  Service: Gynecology;  Laterality: N/A;   BILATERAL SALPINGECTOMY Bilateral 01/12/2015   Procedure: BILATERAL SALPINGECTOMY;  Surgeon: Megan Salon, MD;  Location: [facility];  Service: Gynecology;  Laterality: Bilateral;   BLADDER SUSPENSION N/A 09/22/2015   Procedure: TRANSVAGINAL TAPE (TVT) PROCEDURE;  Surgeon: Nunzio Cobbs, MD;  Location: [facility];  Service: Gynecology;  Laterality: N/A;   CHOLECYSTECTOMY  2008   COLONOSCOPY     CYSTOSCOPY N/A 01/12/2015   Procedure: CYSTOSCOPY;  Surgeon: Megan Salon, MD;  Location: [facility];  Service: Gynecology;  Laterality: N/A;   CYSTOSCOPY N/A 09/22/2015   Procedure: CYSTOSCOPY;  Surgeon: Nunzio Cobbs, MD;  Location: [facility];  Service: Gynecology;  Laterality: N/A;  endometrial biopsy     neg per patient   LEG SKIN LESION  BIOPSY / EXCISION Left 01/20/2015   [facility]-moderate dysplasia per patient   ORIF FINGER FRACTURE     ROBOTIC ASSISTED TOTAL HYSTERECTOMY N/A 01/12/2015   Procedure: ROBOTIC ASSISTED TOTAL HYSTERECTOMY;  Surgeon: Megan Salon, MD;  Location: [facility];  Service: Gynecology;  Laterality: N/A;   TUBAL LIGATION     uterine ablation  2/13   Social History   Social History Narrative   Work or School: NP heaptology      Home Situation: lives with husband and 2 children      Spiritual Beliefs: none      Lifestyle: elliptical; working on diet            Immunization History  Administered Date(s) Administered   Influenza-Unspecified 05/22/2014, 06/05/2015, 05/22/2016, 05/25/2017   PPD Test 10/10/2007   Tdap 08/23/2011, 10/20/2011     Objective: Vital Signs: BP 112/75 (BP Location: Left Arm, Patient Position:  Sitting, Cuff Size: Normal)    Pulse 79    Resp 14    Ht 5' 6" (1.676 m)    Wt 246 lb 6.4 oz (111.8 kg)    LMP 12/24/2014    BMI 39.77 kg/m    Physical Exam Vitals signs and nursing note reviewed.  Constitutional:      Appearance: She is well-developed.  HENT:     Head: Normocephalic and atraumatic.  Eyes:     Conjunctiva/sclera: Conjunctivae normal.  Neck:     Musculoskeletal: Normal range of motion.  Cardiovascular:     Rate and Rhythm: Normal rate and regular rhythm.     Heart sounds: Normal heart sounds.  Pulmonary:     Effort: Pulmonary effort is normal.     Breath sounds: Normal breath sounds.  Abdominal:     General: Bowel sounds are normal.     Palpations: Abdomen is soft.  Lymphadenopathy:     Cervical: No cervical adenopathy.  Skin:    General: Skin is warm and dry.     Capillary Refill: Capillary refill takes less than 2 seconds.  Neurological:     Mental Status: She is alert and oriented to person, place, and time.  Psychiatric:        Behavior: Behavior normal.      Musculoskeletal Exam: C-spine thoracic and lumbar spine good range of motion.  Shoulder joints elbow joints wrist joint MCPs PIPs DIPs were in good range of motion with no synovitis.  She has some tenderness on palpation of her PIP joints.  Hip joints knee joints ankles were in good range of motion.  She has some warmth in her right knee joint.  She has bilateral pes cavus.  CDAI Exam: CDAI Score: -- Patient Global: --; Provider Global: -- Swollen: --; Tender: -- Joint Exam   No joint exam has been documented for this visit   There is currently no information documented on the homunculus. Go to the Rheumatology activity and complete the homunculus joint exam.  Investigation: Findings:  12/26/18: ANA negative, RF <14, CRP <1.0, LDH 118, sed rate 21, Mono screen-, HIV-  Component     Latest Ref Rng & Units 12/26/2018  HIV     NON-REACTI NON-REACTIVE  Mono Screen     Negative Negative  Sed  Rate     0 - 20 mm/hr 21 (H)  CRP     0.5 - 20.0 mg/dL <1.0  LDH     100 -  200 U/L 118  RA Latex Turbid.     <14 IU/mL <14  Anti Nuclear Antibody (ANA)     NEGATIVE NEGATIVE   Imaging: Xr Hand 2 View Left  Result Date: 02/05/2019 Mild PIP and DIP narrowing was noted.  No MCP, intercarpal radiocarpal joint space narrowing was noted.  No erosive changes were noted. Impression: These findings are consistent with mild osteoarthritis of the hand.  Xr Hand 2 View Right  Result Date: 02/05/2019 Minimal PIP and DIP narrowing was noted.  2 pins were noted in the right fourth middle phalanx from previous surgery.  No MCP, intercarpal radiocarpal narrowing was noted. Impression: These findings are consistent with mild osteoarthritis of the hand.  Xr Knee 3 View Right  Result Date: 02/05/2019 Mild medial compartment narrowing was noted.  Moderate patellofemoral narrowing was noted.  Medial and intercondylar osteophytes were noted. Impression: These findings are consistent with mild osteoarthritis and moderate chondromalacia patella.  Xr Pelvis 1-2 Views  Result Date: 02/05/2019 No SI joint narrowing or sclerosis was noted. Impression: Unremarkable x-ray of the SI joints.   Recent Labs: Lab Results  Component Value Date   WBC 12.0 (H) 12/26/2018   HGB 13.7 12/26/2018   PLT 263.0 12/26/2018   NA 136 12/26/2018   K 4.1 12/26/2018   CL 102 12/26/2018   CO2 24 12/26/2018   GLUCOSE 89 12/26/2018   BUN 17 12/26/2018   CREATININE 0.69 12/26/2018   BILITOT 0.3 12/26/2018   ALKPHOS 64 12/26/2018   AST 14 12/26/2018   ALT 18 12/26/2018   PROT 6.7 12/26/2018   PROT 6.8 12/26/2018   ALBUMIN 4.2 12/26/2018   CALCIUM 9.1 12/26/2018   GFRAA >60 12/11/2018    Speciality Comments: No specialty comments available.  Procedures:  No procedures performed Allergies: Patient has no known allergies.   Assessment / Plan:     Visit Diagnoses: Polyarthralgia -patient complains of joint pain for  many years although the joint discomfort has increased in her hands for the last few months.  12/26/18: ANA negative, RF <14, CRP <1.0, LDH 118, sed rate 21, Mono screen-, HIV-   Pain in both hands -she gives history of trigger fingers although I did not find any trigger finger on examination today.  She had right fifth trigger finger injection in the past.  She has been experiencing increased pain and discomfort in her hands for the last few months.  No warmth swelling or synovitis was noted.  She had tenderness of her PIPs.  X-ray of bilateral hands were consistent with mild osteoarthritis.  Previous serology has been negative.  I will obtain additional labs today using Pennsaid topically.  I have also given her a handout on hand exercises.  Plan: XR Hand 2 View Right, XR Hand 2 View Left, Cyclic citrul peptide antibody, IgG, 14-3-3 eta Protein, Parvovirus B19 antibody, IgG and IgM,   Chronic pain of right knee -she had warmth on palpation of her right knee.  Her knee joint was in full range of motion.  X-ray of the knee joint showed mild osteoarthritis and moderate chondromalacia patella.  A handout on knee exercises was given.  I will obtain following labs today.  Plan: XR KNEE 3 VIEW RIGHT, Uric acid, Angiotensin converting enzyme, HLA-B27 antigen, Pan-ANCA, B. burgdorfi antibodies,  Chronic SI joint pain -patient complains of SI joint pain for many years.  SI joint x-ray was unremarkable today.  Plan: XR Pelvis 1-2 Views,   Pes cavus - Plan: I have  advised her to get proper fitting orthotics with good arch support.  Fever, unspecified fever cause -patient has had her low-grade fever for several months now.  ID evaluation was unremarkable per patient.  She still has follow-up with ID.  I reviewed labs in the chart.  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  Treated with topical steroids.  She continues to have sicca symptoms.  Bilateral carpal tunnel  syndrome -patient states she had right carpal tunnel injection by Dr. Paulla Fore in the past.  Currently her symptoms are not active.  Hx of migraines -she is on treatment.  History of gastroesophageal reflux (GERD) -she takes medications.  History of depression - Plan: She is currently on treatment.  Other fatigue - Plan: CK,    Orders: Orders Placed This Encounter  Procedures   XR Hand 2 View Right   XR Hand 2 View Left   XR KNEE 3 VIEW RIGHT   XR Pelvis 1-2 Views   CK   Uric acid   Cyclic citrul peptide antibody, IgG   14-3-3 eta Protein   Angiotensin converting enzyme   HLA-B27 antigen   Pan-ANCA   B. burgdorfi antibodies   Parvovirus B19 antibody, IgG and IgM   No orders of the defined types were placed in this encounter.   Face-to-face time spent with patient was  minutes. Greater than 50% of time was spent in counseling and coordination of care.  Follow-Up Instructions: Return for Polyarthralgia.   Bo Merino, MD  Note - This record has been created using Editor, commissioning.  Chart creation errors have been sought, but may not always  have been located. Such creation errors do not reflect on  the standard of medical care.

## 2019-01-28 ENCOUNTER — Ambulatory Visit: Payer: PRIVATE HEALTH INSURANCE | Admitting: Rheumatology

## 2019-02-05 ENCOUNTER — Encounter: Payer: Self-pay | Admitting: Rheumatology

## 2019-02-05 ENCOUNTER — Ambulatory Visit: Payer: Self-pay

## 2019-02-05 ENCOUNTER — Ambulatory Visit (INDEPENDENT_AMBULATORY_CARE_PROVIDER_SITE_OTHER): Payer: PRIVATE HEALTH INSURANCE

## 2019-02-05 ENCOUNTER — Other Ambulatory Visit: Payer: Self-pay

## 2019-02-05 ENCOUNTER — Ambulatory Visit: Payer: PRIVATE HEALTH INSURANCE | Admitting: Rheumatology

## 2019-02-05 VITALS — BP 112/75 | HR 79 | Resp 14 | Ht 66.0 in | Wt 246.4 lb

## 2019-02-05 DIAGNOSIS — M2550 Pain in unspecified joint: Secondary | ICD-10-CM | POA: Diagnosis not present

## 2019-02-05 DIAGNOSIS — R509 Fever, unspecified: Secondary | ICD-10-CM

## 2019-02-05 DIAGNOSIS — M79641 Pain in right hand: Secondary | ICD-10-CM | POA: Diagnosis not present

## 2019-02-05 DIAGNOSIS — M533 Sacrococcygeal disorders, not elsewhere classified: Secondary | ICD-10-CM | POA: Diagnosis not present

## 2019-02-05 DIAGNOSIS — M79642 Pain in left hand: Secondary | ICD-10-CM

## 2019-02-05 DIAGNOSIS — M25561 Pain in right knee: Secondary | ICD-10-CM

## 2019-02-05 DIAGNOSIS — Z8719 Personal history of other diseases of the digestive system: Secondary | ICD-10-CM

## 2019-02-05 DIAGNOSIS — G8929 Other chronic pain: Secondary | ICD-10-CM

## 2019-02-05 DIAGNOSIS — Q667 Congenital pes cavus, unspecified foot: Secondary | ICD-10-CM

## 2019-02-05 DIAGNOSIS — R5383 Other fatigue: Secondary | ICD-10-CM

## 2019-02-05 DIAGNOSIS — Z8669 Personal history of other diseases of the nervous system and sense organs: Secondary | ICD-10-CM

## 2019-02-05 DIAGNOSIS — Z8659 Personal history of other mental and behavioral disorders: Secondary | ICD-10-CM

## 2019-02-05 DIAGNOSIS — G5603 Carpal tunnel syndrome, bilateral upper limbs: Secondary | ICD-10-CM

## 2019-02-05 NOTE — Patient Instructions (Signed)
Knee Exercises Ask your health care provider which exercises are safe for you. Do exercises exactly as told by your health care provider and adjust them as directed. It is normal to feel mild stretching, pulling, tightness, or discomfort as you do these exercises, but you should stop right away if you feel sudden pain or your pain gets worse.Do not begin these exercises until told by your health care provider. STRETCHING AND RANGE OF MOTION EXERCISES These exercises warm up your muscles and joints and improve the movement and flexibility of your knee. These exercises also help to relieve pain, numbness, and tingling. Exercise A: Knee Extension, Prone 1. Lie on your abdomen on a bed. 2. Place your left / right knee just beyond the edge of the surface so your knee is not on the bed. You can put a towel under your left / right thigh just above your knee for comfort. 3. Relax your leg muscles and allow gravity to straighten your knee. You should feel a stretch behind your left / right knee. 4. Hold this position for __________ seconds. 5. Scoot up so your knee is supported between repetitions. Repeat __________ times. Complete this stretch __________ times a day. Exercise B: Knee Flexion, Active  1. Lie on your back with both knees straight. If this causes back discomfort, bend your left / right knee so your foot is flat on the floor. 2. Slowly slide your left / right heel back toward your buttocks until you feel a gentle stretch in the front of your knee or thigh. 3. Hold this position for __________ seconds. 4. Slowly slide your left / right heel back to the starting position. Repeat __________ times. Complete this exercise __________ times a day. Exercise C: Quadriceps, Prone  1. Lie on your abdomen on a firm surface, such as a bed or padded floor. 2. Bend your left / right knee and hold your ankle. If you cannot reach your ankle or pant leg, loop a belt around your foot and grab the belt  instead. 3. Gently pull your heel toward your buttocks. Your knee should not slide out to the side. You should feel a stretch in the front of your thigh and knee. 4. Hold this position for __________ seconds. Repeat __________ times. Complete this stretch __________ times a day. Exercise D: Hamstring, Supine 1. Lie on your back. 2. Loop a belt or towel over the ball of your left / right foot. The ball of your foot is on the walking surface, right under your toes. 3. Straighten your left / right knee and slowly pull on the belt to raise your leg until you feel a gentle stretch behind your knee. ? Do not let your left / right knee bend while you do this. ? Keep your other leg flat on the floor. 4. Hold this position for __________ seconds. Repeat __________ times. Complete this stretch __________ times a day. STRENGTHENING EXERCISES These exercises build strength and endurance in your knee. Endurance is the ability to use your muscles for a long time, even after they get tired. Exercise E: Quadriceps, Isometric  1. Lie on your back with your left / right leg extended and your other knee bent. Put a rolled towel or small pillow under your knee if told by your health care provider. 2. Slowly tense the muscles in the front of your left / right thigh. You should see your kneecap slide up toward your hip or see increased dimpling just above the knee. This   motion will push the back of the knee toward the floor. 3. For __________ seconds, keep the muscle as tight as you can without increasing your pain. 4. Relax the muscles slowly and completely. Repeat __________ times. Complete this exercise __________ times a day. Exercise F: Straight Leg Raises - Quadriceps 1. Lie on your back with your left / right leg extended and your other knee bent. 2. Tense the muscles in the front of your left / right thigh. You should see your kneecap slide up or see increased dimpling just above the knee. Your thigh may  even shake a bit. 3. Keep these muscles tight as you raise your leg 4-6 inches (10-15 cm) off the floor. Do not let your knee bend. 4. Hold this position for __________ seconds. 5. Keep these muscles tense as you lower your leg. 6. Relax your muscles slowly and completely after each repetition. Repeat __________ times. Complete this exercise __________ times a day. Exercise G: Hamstring, Isometric 1. Lie on your back on a firm surface. 2. Bend your left / right knee approximately __________ degrees. 3. Dig your left / right heel into the surface as if you are trying to pull it toward your buttocks. Tighten the muscles in the back of your thighs to dig as hard as you can without increasing any pain. 4. Hold this position for __________ seconds. 5. Release the tension gradually and allow your muscles to relax completely for __________ seconds after each repetition. Repeat __________ times. Complete this exercise __________ times a day. Exercise H: Hamstring Curls  If told by your health care provider, do this exercise while wearing ankle weights. Begin with __________ weights. Then increase the weight by 1 lb (0.5 kg) increments. Do not wear ankle weights that are more than __________. 1. Lie on your abdomen with your legs straight. 2. Bend your left / right knee as far as you can without feeling pain. Keep your hips flat against the floor. 3. Hold this position for __________ seconds. 4. Slowly lower your leg to the starting position.  Repeat __________ times. Complete this exercise __________ times a day. Exercise I: Squats (Quadriceps) 1. Stand in front of a table, with your feet and knees pointing straight ahead. You may rest your hands on the table for balance but not for support. 2. Slowly bend your knees and lower your hips like you are going to sit in a chair. ? Keep your weight over your heels, not over your toes. ? Keep your lower legs upright so they are parallel with the table  legs. ? Do not let your hips go lower than your knees. ? Do not bend lower than told by your health care provider. ? If your knee pain increases, do not bend as low. 3. Hold the squat position for __________ seconds. 4. Slowly push with your legs to return to standing. Do not use your hands to pull yourself to standing. Repeat __________ times. Complete this exercise __________ times a day. Exercise J: Wall Slides (Quadriceps)  1. Lean your back against a smooth wall or door while you walk your feet out 18-24 inches (46-61 cm) from it. 2. Place your feet hip-width apart. 3. Slowly slide down the wall or door until your knees bend __________ degrees. Keep your knees over your heels, not over your toes. Keep your knees in line with your hips. 4. Hold for __________ seconds. Repeat __________ times. Complete this exercise __________ times a day. Exercise K: Straight Leg Raises -   Hip Abductors 1. Lie on your side with your left / right leg in the top position. Lie so your head, shoulder, knee, and hip line up. You may bend your bottom knee to help you keep your balance. 2. Roll your hips slightly forward so your hips are stacked directly over each other and your left / right knee is facing forward. 3. Leading with your heel, lift your top leg 4-6 inches (10-15 cm). You should feel the muscles in your outer hip lifting. ? Do not let your foot drift forward. ? Do not let your knee roll toward the ceiling. 4. Hold this position for __________ seconds. 5. Slowly return your leg to the starting position. 6. Let your muscles relax completely after each repetition. Repeat __________ times. Complete this exercise __________ times a day. Exercise L: Straight Leg Raises - Hip Extensors 1. Lie on your abdomen on a firm surface. You can put a pillow under your hips if that is more comfortable. 2. Tense the muscles in your buttocks and lift your left / right leg about 4-6 inches (10-15 cm). Keep your knee  straight as you lift your leg. 3. Hold this position for __________ seconds. 4. Slowly lower your leg to the starting position. 5. Let your leg relax completely after each repetition. Repeat __________ times. Complete this exercise __________ times a day. This information is not intended to replace advice given to you by your health care provider. Make sure you discuss any questions you have with your health care provider. Document Released: 06/22/2005 Document Revised: 05/02/2016 Document Reviewed: 06/14/2015 [employer] Interactive Patient Education  2018 [employer] Hand Exercises Hand exercises can be helpful to almost anyone. These exercises can strengthen the hands, improve flexibility and movement, and increase blood flow to the hands. These results can make work and daily tasks easier. Hand exercises can be especially helpful for people who have joint pain from arthritis or have nerve damage from overuse (carpal tunnel syndrome). These exercises can also help people who have injured a hand. Most of these hand exercises are fairly gentle stretching routines. You can do them often throughout the day. Still, it is a good idea to ask your health care provider which exercises would be best for you. Warming your hands before exercise may help to reduce stiffness. You can do this with gentle massage or by placing your hands in warm water for 15 minutes. Also, make sure you pay attention to your level of hand pain as you begin an exercise routine. Exercises Knuckle bend Repeat this exercise 5-10 times with each hand. 1. Stand or sit with your arm, hand, and all five fingers pointed straight up. Make sure your wrist is straight. 2. Gently and slowly bend your fingers down and inward until the tips of your fingers are touching the tops of your palm. 3. Hold this position for a few seconds. 4. Extend your fingers out to their original position, all pointing straight up again. Finger fan Repeat this  exercise 5-10 times with each hand. 1. Hold your arm and hand out in front of you. Keep your wrist straight. 2. Squeeze your hand into a fist. 3. Hold this position for a few seconds. 4. Fan out, or spread apart, your hand and fingers as much as possible, stretching every joint fully. Tabletop Repeat this exercise 5-10 times with each hand. 1. Stand or sit with your arm, hand, and all five fingers pointed straight up. Make sure your wrist is straight. 2. Gently   and slowly bend your fingers at the knuckles where they meet the hand until your hand is making an upside-down L shape. Your fingers should form a tabletop. 3. Hold this position for a few seconds. 4. Extend your fingers out to their original position, all pointing straight up again. Making Os Repeat this exercise 5-10 times with each hand. 1. Stand or sit with your arm, hand, and all five fingers pointed straight up. Make sure your wrist is straight. 2. Make an O shape by touching your pointer finger to your thumb. Hold for a few seconds. Then open your hand wide. 3. Repeat this motion with each finger on your hand. Table spread Repeat this exercise 5-10 times with each hand. 1. Place your hand on a table with your palm facing down. Make sure your wrist is straight. 2. Spread your fingers out as much as possible. Hold this position for a few seconds. 3. Slide your fingers back together again. Hold for a few seconds. Ball grip Repeat this exercise 10-15 times with each hand. 1. Hold a tennis ball or another soft ball in your hand. 2. While slowly increasing pressure, squeeze the ball as hard as possible. 3. Squeeze as hard as you can for 3-5 seconds. 4. Relax and repeat.  Wrist curls Repeat this exercise 10-15 times with each hand. 1. Sit in a chair that has armrests. 2. Hold a light weight in your hand, such as a dumbbell that weighs 1-3 pounds (0.5-1.4 kg). Ask your health care provider what weight would be best for you. 3.  Rest your hand just over the end of the chair arm with your palm facing up. 4. Gently pivot your wrist up and down while holding the weight. Do not twist your wrist from side to side. Contact a health care provider if:  Your hand pain or discomfort gets much worse when you do an exercise.  Your hand pain or discomfort does not improve within 2 hours after you exercise. If you have any of these problems, stop doing these exercises right away. Do not do them again unless your health care provider says that you can. Get help right away if:  You develop sudden, severe hand pain. If this happens, stop doing these exercises right away. Do not do them again unless your health care provider says that you can. This information is not intended to replace advice given to you by your health care provider. Make sure you discuss any questions you have with your health care provider. Document Released: 07/20/2015 Document Revised: 12/12/2017 Document Reviewed: 02/16/2015 [employer] Interactive Patient Education  2019 [employer]  

## 2019-02-12 LAB — PARVOVIRUS B19 ANTIBODY, IGG AND IGM
Parvovirus B19 IgG: 0.1 (ref <0.9)
Parvovirus B19 IgM: 0.1 (ref <0.9)

## 2019-02-12 LAB — URIC ACID: Uric Acid, Serum: 6.3 mg/dL (ref 2.5–7.0)

## 2019-02-12 LAB — CK: Total CK: 35 U/L (ref 29–143)

## 2019-02-12 LAB — PAN-ANCA
ANCA Screen: NEGATIVE
Myeloperoxidase Abs: <1 AI
Serine Protease 3: 1.5 AI — ABNORMAL HIGH

## 2019-02-12 LAB — ANGIOTENSIN CONVERTING ENZYME: Angiotensin-Converting Enzyme: 18 U/L (ref 9–67)

## 2019-02-12 LAB — 14-3-3 ETA PROTEIN: 14-3-3 eta Protein: <0.2 ng/mL (ref <0.2)

## 2019-02-12 LAB — B. BURGDORFI ANTIBODIES: B burgdorferi Ab IgG+IgM: <0.9 index

## 2019-02-12 LAB — CYCLIC CITRUL PEPTIDE ANTIBODY, IGG: Cyclic Citrullin Peptide Ab: <16 UNITS

## 2019-02-12 LAB — HLA-B27 ANTIGEN: HLA-B27 Antigen: NEGATIVE

## 2019-02-13 NOTE — Progress Notes (Signed)
I will discuss results at the follow-up visit.

## 2019-02-20 ENCOUNTER — Ambulatory Visit: Payer: PRIVATE HEALTH INSURANCE | Admitting: Rheumatology

## 2019-02-20 ENCOUNTER — Other Ambulatory Visit: Payer: Self-pay

## 2019-02-20 ENCOUNTER — Encounter: Payer: Self-pay | Admitting: Rheumatology

## 2019-02-20 ENCOUNTER — Ambulatory Visit: Payer: Self-pay

## 2019-02-20 VITALS — BP 127/86 | HR 86 | Resp 14 | Ht 66.0 in | Wt 249.0 lb

## 2019-02-20 DIAGNOSIS — M2550 Pain in unspecified joint: Secondary | ICD-10-CM

## 2019-02-20 DIAGNOSIS — G8929 Other chronic pain: Secondary | ICD-10-CM

## 2019-02-20 DIAGNOSIS — G5603 Carpal tunnel syndrome, bilateral upper limbs: Secondary | ICD-10-CM

## 2019-02-20 DIAGNOSIS — M533 Sacrococcygeal disorders, not elsewhere classified: Secondary | ICD-10-CM

## 2019-02-20 DIAGNOSIS — Z8719 Personal history of other diseases of the digestive system: Secondary | ICD-10-CM

## 2019-02-20 DIAGNOSIS — M79641 Pain in right hand: Secondary | ICD-10-CM

## 2019-02-20 DIAGNOSIS — M1711 Unilateral primary osteoarthritis, right knee: Secondary | ICD-10-CM

## 2019-02-20 DIAGNOSIS — M79642 Pain in left hand: Secondary | ICD-10-CM

## 2019-02-20 DIAGNOSIS — Q667 Congenital pes cavus, unspecified foot: Secondary | ICD-10-CM

## 2019-02-20 DIAGNOSIS — Z8669 Personal history of other diseases of the nervous system and sense organs: Secondary | ICD-10-CM

## 2019-02-20 DIAGNOSIS — R5383 Other fatigue: Secondary | ICD-10-CM

## 2019-02-20 DIAGNOSIS — Z8659 Personal history of other mental and behavioral disorders: Secondary | ICD-10-CM

## 2019-02-20 NOTE — Progress Notes (Signed)
Office Visit Note  Patient: Diane Scott             Date of Birth: 03-01-1976           MRN: [id number]             PCP: Lucretia Kern, DO Referring: Lucretia Kern, DO Visit Date: 02/20/2019 Occupation: '@GUAROCC' @  Subjective:  Pain in both hands.   History of Present Illness: Diane Scott is a 43 y.o. female with history of polyarthralgia and ongoing fever.  She states she continues to have pain and discomfort in her hands.  She states the carpal tunnel syndrome symptoms are getting worse.  Has been using night splints which does not help much.  She has some discomfort in her other joints which include her shoulders knee joints and her feet.  She states she has been using proper fitting shoes which is been helpful.  He denies any joint swelling.  She states recently she has not had any fever.  Activities of Daily Living:  Patient reports morning stiffness for 1-2 hours.   Patient Reports nocturnal pain.  Difficulty dressing/grooming: Denies Difficulty climbing stairs: Denies Difficulty getting out of chair: Denies Difficulty using hands for taps, buttons, cutlery, and/or writing: Reports  Review of Systems  Constitutional: Positive for fatigue. Negative for night sweats, weight gain and weight loss.  HENT: Positive for mouth dryness. Negative for mouth sores, trouble swallowing, trouble swallowing and nose dryness.   Eyes: Positive for dryness. Negative for pain, redness, itching and visual disturbance.  Respiratory: Negative for cough, shortness of breath and difficulty breathing.   Cardiovascular: Negative for chest pain, palpitations, hypertension, irregular heartbeat and swelling in legs/feet.  Gastrointestinal: Negative for abdominal pain, blood in stool, constipation and diarrhea.  Endocrine: Negative for increased urination.  Genitourinary: Negative for painful urination, pelvic pain and vaginal dryness.  Musculoskeletal: Positive for arthralgias, joint pain and morning  stiffness. Negative for joint swelling, myalgias, muscle weakness, muscle tenderness and myalgias.  Skin: Positive for hair loss. Negative for color change, rash, skin tightness, ulcers and sensitivity to sunlight.  Allergic/Immunologic: Negative for susceptible to infections.  Neurological: Negative for dizziness, light-headedness, headaches, memory loss, night sweats and weakness.  Hematological: Negative for bruising/bleeding tendency and swollen glands.  Psychiatric/Behavioral: Negative for depressed mood, confusion and sleep disturbance. The patient is not nervous/anxious.     PMFS History:  Patient Active Problem List   Diagnosis Date Noted  . Bilateral hand pain 10/13/2017  . 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 BMI 40.0-44.9, adult ([place]) 10/05/2017  . Stress incontinence 11/20/2014  . OA (osteoarthritis) of knee 12/26/2013  . Cystocele, lateral 12/23/2010  . Headache, migraine 11/11/2010  . Atypical nevus 10/05/2009  . Clinical depression 10/05/2009  . Carpal tunnel syndrome 10/05/2009  . Acid reflux 05/12/2009    Past Medical History:  Diagnosis Date  . Arthritis   . Cystocele   . Depression   . Dysplasia of skin   . 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 Results for S65C were negative. The  mutations analyzed by [facility] are most common in the Caucasian  population. Although some patients with this genotype experience  biochemically defined abnormalities of iron overload, the penetrance  for clinical symptoms, such as cirrhosis, cardiomyopa  . GERD (gastroesophageal reflux disease)   . Migraine headache with aura 2012   hx numb face with  migraine  . Obesity   . Osteoarthritis of both knees   . Tubular adenoma 05/12/2009    Family History  Problem Relation Age of Onset  . Arthritis Mother   . Hypertension Mother   . Hyperlipidemia Mother   . Stroke Mother   . Arthritis Father    . Hyperlipidemia Father   . Hypertension Father   . Diabetes Father   . Melanoma Maternal Aunt   . Melanoma Paternal Uncle   . Arthritis Maternal Grandmother   . Alcohol abuse Maternal Grandfather   . Squamous cell carcinoma Maternal Grandfather   . Arthritis Paternal Grandmother   . Melanoma Paternal Grandmother   . Alcohol abuse Paternal Grandfather   . Squamous cell carcinoma Maternal Aunt   . Colon cancer Paternal Uncle   . Liver cancer Paternal Uncle    Past Surgical History:  Procedure Laterality Date  . ANTERIOR AND POSTERIOR REPAIR N/A 09/22/2015   Procedure: ANTERIOR (CYSTOCELE) AND POSTERIOR REPAIR (RECTOCELE) ;  Surgeon: Nunzio Cobbs, MD;  Location: [facility];  Service: Gynecology;  Laterality: N/A;  . BILATERAL SALPINGECTOMY Bilateral 01/12/2015   Procedure: BILATERAL SALPINGECTOMY;  Surgeon: Megan Salon, MD;  Location: [facility];  Service: Gynecology;  Laterality: Bilateral;  . BLADDER SUSPENSION N/A 09/22/2015   Procedure: TRANSVAGINAL TAPE (TVT) PROCEDURE;  Surgeon: Nunzio Cobbs, MD;  Location: [facility];  Service: Gynecology;  Laterality: N/A;  . CHOLECYSTECTOMY  2008  . COLONOSCOPY    . CYSTOSCOPY N/A 01/12/2015   Procedure: CYSTOSCOPY;  Surgeon: Megan Salon, MD;  Location: [facility];  Service: Gynecology;  Laterality: N/A;  . CYSTOSCOPY N/A 09/22/2015   Procedure: CYSTOSCOPY;  Surgeon: Nunzio Cobbs, MD;  Location: [facility];  Service: Gynecology;  Laterality: N/A;  . endometrial biopsy     neg per patient  . LEG SKIN LESION  BIOPSY / EXCISION Left 01/20/2015   [facility]-moderate dysplasia per patient  . ORIF FINGER FRACTURE    . ROBOTIC ASSISTED TOTAL HYSTERECTOMY N/A 01/12/2015   Procedure: ROBOTIC ASSISTED TOTAL HYSTERECTOMY;  Surgeon: Megan Salon, MD;  Location: [facility];  Service: Gynecology;  Laterality: N/A;  . TUBAL LIGATION    . uterine ablation  2/13   Social History   Social History Narrative   Work or School: NP  heaptology      Home Situation: lives with husband and 2 children      Spiritual Beliefs: none      Lifestyle: elliptical; working on diet            Immunization History  Administered Date(s) Administered  . Influenza-Unspecified 05/22/2014, 06/05/2015, 05/22/2016, 05/25/2017  . PPD Test 10/10/2007  . Tdap 08/23/2011, 10/20/2011     Objective: Vital Signs: BP 127/86 (BP Location: Left Wrist, Patient Position: Sitting, Cuff Size: Normal)   Pulse 86   Resp 14   Ht '5\' 6"'  (1.676 m)   Wt 249 lb (112.9 kg)   LMP 12/24/2014   BMI 40.19 kg/m    Physical Exam Vitals signs and nursing note reviewed.  Constitutional:      Appearance: She is well-developed.  HENT:     Head: Normocephalic and atraumatic.  Eyes:     Conjunctiva/sclera: Conjunctivae normal.  Neck:     Musculoskeletal: Normal range of motion.  Cardiovascular:     Rate and Rhythm: Normal rate and regular rhythm.     Heart sounds: Normal heart sounds.  Pulmonary:  Effort: Pulmonary effort is normal.     Breath sounds: Normal breath sounds.  Abdominal:     General: Bowel sounds are normal.     Palpations: Abdomen is soft.  Lymphadenopathy:     Cervical: No cervical adenopathy.  Skin:    General: Skin is warm and dry.     Capillary Refill: Capillary refill takes less than 2 seconds.  Neurological:     Mental Status: She is alert and oriented to person, place, and time.  Psychiatric:        Behavior: Behavior normal.      Musculoskeletal Exam: C-spine thoracic and lumbar spine with good range of motion.  Shoulder joints elbow joints wrist joint MCPs PIPs DIPs been good range of motion.  She has positive Phalen's, Tinel's and manual compression test in bilateral hands.  Hip joints, knee joints, ankles, MTPs PIPs DIPs with good range of motion with no synovitis.  She has bilateral pes cavus.  CDAI Exam: CDAI Score: - Patient Global: -; Provider Global: - Swollen: -; Tender: - Joint Exam   No joint exam  has been documented for this visit   There is currently no information documented on the homunculus. Go to the Rheumatology activity and complete the homunculus joint exam.  Investigation: No additional findings.  Imaging: [place] Extrem Up Bilat Comp  Result Date: 02/20/2019 Ultrasound examination of bilateral hands was performed per EULAR recommendations. Using 12 MHz transducer, grayscale and power Doppler bilateral second, third, and fifth MCP joints and bilateral wrist joints both dorsal and volar aspects were evaluated to look for synovitis or tenosynovitis. The findings were there was no synovitis or tenosynovitis on ultrasound examination. Right median nerve was 0.12 cm squares which was more than upper limits of normal and left median nerve was 0.15 cm squares which was more than upper limits of normal. Impression: Ultrasound examination of bilateral hands did not show any synovitis.  Bilateral median nerves were enlarged.  Xr Hand 2 View Left  Result Date: 02/05/2019 Mild PIP and DIP narrowing was noted.  No MCP, intercarpal radiocarpal joint space narrowing was noted.  No erosive changes were noted. Impression: These findings are consistent with mild osteoarthritis of the hand.  Xr Hand 2 View Right  Result Date: 02/05/2019 Minimal PIP and DIP narrowing was noted.  2 pins were noted in the right fourth middle phalanx from previous surgery.  No MCP, intercarpal radiocarpal narrowing was noted. Impression: These findings are consistent with mild osteoarthritis of the hand.  Xr Knee 3 View Right  Result Date: 02/05/2019 Mild medial compartment narrowing was noted.  Moderate patellofemoral narrowing was noted.  Medial and intercondylar osteophytes were noted. Impression: These findings are consistent with mild osteoarthritis and moderate chondromalacia patella.  Xr Pelvis 1-2 Views  Result Date: 02/05/2019 No SI joint narrowing or sclerosis was noted. Impression: Unremarkable x-ray of the  SI joints.   Recent Labs: Lab Results  Component Value Date   WBC 12.0 (H) 12/26/2018   HGB 13.7 12/26/2018   PLT 263.0 12/26/2018   NA 136 12/26/2018   K 4.1 12/26/2018   CL 102 12/26/2018   CO2 24 12/26/2018   GLUCOSE 89 12/26/2018   BUN 17 12/26/2018   CREATININE 0.69 12/26/2018   BILITOT 0.3 12/26/2018   ALKPHOS 64 12/26/2018   AST 14 12/26/2018   ALT 18 12/26/2018   PROT 6.7 12/26/2018   PROT 6.8 12/26/2018   ALBUMIN 4.2 12/26/2018   CALCIUM 9.1 12/26/2018   GFRAA >60  12/11/2018  February 05, 2019 CK 35, ANCA negative, proteinase 3- 1.5 mildly elevated, MPO negative, parvo negative, uric acid 6.3, anti-CCP negative, '14 3 3 ' eta negative, ACE negative, HLA-B27 negative, Lyme titer negative  12/26/18: ANA negative, RF <14, CRP <1.0, LDH 118, sed rate 21, Mono screen-, HIV-  Speciality Comments: No specialty comments available.  Procedures:  No procedures performed Allergies: Patient has no known allergies.   Assessment / Plan:     Visit Diagnoses: Pain in both hands -she continues to have discomfort in her bilateral hands.  There was no synovitis on examination.  Ultrasound bilateral hands today did not show any synovitis.  Bilateral median nerves were enlarged. - Plan: [place] Extrem Up Bilat Comp,   Bilateral carpal tunnel syndrome - she had right carpal tunnel injection by Dr. Paulla Fore in the past with an adequate response.  She continues to have symptoms of bilateral carpal tunnel syndrome.  She had positive Phalen's and Tinel's test.  Ergonomic modifications were discussed.  We will also schedule nerve conduction velocity to evaluate this further.  Polyarthralgia - All autoimmune work-up negative.  The lab findings were discussed with her at length.  She believes her joint symptoms are gradually improving.  Primary osteoarthritis of right knee - Mild osteoarthritis with moderate chondromalacia patella - Plan: Weight loss diet and knee joint exercises were discussed.  Chronic SI  joint pain -symptoms flare off and on.  Pes cavus - Plan: Use of proper fitting shoes was discussed.  Hx of migraines  History of gastroesophageal reflux (GERD)   History of depression   Other fatigue -improving  Fever, unspecified fever cause -patient has had her low-grade fever for several months now.  ID evaluation was unremarkable per patient.  She still has follow-up with ID.  I reviewed labs in the chart.  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  Treated with topical steroids.  She continues to have sicca symptoms.  Orders: Orders Placed This Encounter  Procedures  . [place] Extrem Up Bilat Comp   No orders of the defined types were placed in this encounter.     Follow-Up Instructions: Return if symptoms worsen or fail to improve, for Pain in multiple joints.   Bo Merino, MD  Note - This record has been created using Editor, commissioning.  Chart creation errors have been sought, but may not always  have been located. Such creation errors do not reflect on  the standard of medical care.

## 2019-02-27 ENCOUNTER — Ambulatory Visit: Payer: PRIVATE HEALTH INSURANCE | Admitting: Rheumatology

## 2019-02-28 ENCOUNTER — Ambulatory Visit (INDEPENDENT_AMBULATORY_CARE_PROVIDER_SITE_OTHER): Payer: PRIVATE HEALTH INSURANCE | Admitting: Family Medicine

## 2019-02-28 ENCOUNTER — Encounter: Payer: Self-pay | Admitting: Family Medicine

## 2019-02-28 ENCOUNTER — Other Ambulatory Visit: Payer: Self-pay

## 2019-02-28 ENCOUNTER — Telehealth: Payer: Self-pay | Admitting: *Deleted

## 2019-02-28 DIAGNOSIS — B349 Viral infection, unspecified: Secondary | ICD-10-CM | POA: Diagnosis not present

## 2019-02-28 DIAGNOSIS — M2550 Pain in unspecified joint: Secondary | ICD-10-CM

## 2019-02-28 DIAGNOSIS — R030 Elevated blood-pressure reading, without diagnosis of hypertension: Secondary | ICD-10-CM

## 2019-02-28 DIAGNOSIS — E785 Hyperlipidemia, unspecified: Secondary | ICD-10-CM

## 2019-02-28 DIAGNOSIS — E669 Obesity, unspecified: Secondary | ICD-10-CM

## 2019-02-28 NOTE — Telephone Encounter (Signed)
I called the pt, she stated she will check her schedule and call back for an appt.

## 2019-02-28 NOTE — Progress Notes (Signed)
Virtual Visit via Video Note  I connected with Diane Scott  on 02/28/19 at 12:40 PM EDT by a video enabled telemedicine application and verified that I am speaking with the correct person using two identifiers.  Location patient: home Location provider:work or home office Persons participating in the virtual visit: patient, provider  I discussed the limitations of evaluation and management by telemedicine and the availability of in person appointments. The patient expressed understanding and agreed to proceed.   HPI:  Diane Scott is a pleasant 43 yo with a PMH of HLD, Obesity and depression. She is  Doing much better. Fevers have resolved. She saw the rheumatologist and had an extensive evaluation. She now is doing well. Denies fevers, malaise or polyarthralgia. She still has some CTS in the R wrist and is seeing ortho. BP 106/85 P 81 Has gained some weight with the pandemic. Otherwise doing well.   ROS: See pertinent positives and negatives per HPI.  Past Medical History:  Diagnosis Date  . Arthritis   . Cystocele   . Depression   . Dysplasia of skin   . 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 Results for S65C were negative. The  mutations analyzed by [facility] are most common in the Caucasian  population. Although some patients with this genotype experience  biochemically defined abnormalities of iron overload, the penetrance  for clinical symptoms, such as cirrhosis, cardiomyopa  . GERD (gastroesophageal reflux disease)   . Migraine headache with aura 2012   hx numb face with migraine  . Obesity   . Osteoarthritis of both knees   . Tubular adenoma 05/12/2009    Past Surgical History:  Procedure Laterality Date  . ANTERIOR AND POSTERIOR REPAIR N/A 09/22/2015   Procedure: ANTERIOR (CYSTOCELE) AND POSTERIOR REPAIR (RECTOCELE) ;  Surgeon: Nunzio Cobbs, MD;  Location: [facility];  Service:  Gynecology;  Laterality: N/A;  . BILATERAL SALPINGECTOMY Bilateral 01/12/2015   Procedure: BILATERAL SALPINGECTOMY;  Surgeon: Megan Salon, MD;  Location: [facility];  Service: Gynecology;  Laterality: Bilateral;  . BLADDER SUSPENSION N/A 09/22/2015   Procedure: TRANSVAGINAL TAPE (TVT) PROCEDURE;  Surgeon: Nunzio Cobbs, MD;  Location: [facility];  Service: Gynecology;  Laterality: N/A;  . CHOLECYSTECTOMY  2008  . COLONOSCOPY    . CYSTOSCOPY N/A 01/12/2015   Procedure: CYSTOSCOPY;  Surgeon: Megan Salon, MD;  Location: [facility];  Service: Gynecology;  Laterality: N/A;  . CYSTOSCOPY N/A 09/22/2015   Procedure: CYSTOSCOPY;  Surgeon: Nunzio Cobbs, MD;  Location: [facility];  Service: Gynecology;  Laterality: N/A;  . endometrial biopsy     neg per patient  . LEG SKIN LESION  BIOPSY / EXCISION Left 01/20/2015   [facility]-moderate dysplasia per patient  . ORIF FINGER FRACTURE    . ROBOTIC ASSISTED TOTAL HYSTERECTOMY N/A 01/12/2015   Procedure: ROBOTIC ASSISTED TOTAL HYSTERECTOMY;  Surgeon: Megan Salon, MD;  Location: [facility];  Service: Gynecology;  Laterality: N/A;  . TUBAL LIGATION    . uterine ablation  2/13    Family History  Problem Relation Age of Onset  . Arthritis Mother   . Hypertension Mother   . Hyperlipidemia Mother   . Stroke Mother   . Arthritis Father   . Hyperlipidemia Father   . Hypertension Father   . Diabetes Father   . Melanoma Maternal Aunt   . Melanoma  Paternal Uncle   . Arthritis Maternal Grandmother   . Alcohol abuse Maternal Grandfather   . Squamous cell carcinoma Maternal Grandfather   . Arthritis Paternal Grandmother   . Melanoma Paternal Grandmother   . Alcohol abuse Paternal Grandfather   . Squamous cell carcinoma Maternal Aunt   . Colon cancer Paternal Uncle   . Liver cancer Paternal Uncle     SOCIAL HX: see hpi   Current Outpatient Medications:  .  cholecalciferol (VITAMIN D3) 25 MCG (1000 UT) tablet, Take 1,000 Units by mouth  daily., Disp: , Rfl:  .  Diclofenac Sodium (PENNSAID) 2 % SOLN, Place 1 application onto the skin 2 (two) times daily., Disp: 112 g, Rfl: 2 .  lamoTRIgine (LAMICTAL) 100 MG tablet, Take 1 tablet by mouth daily., Disp: , Rfl:  .  methylphenidate (RITALIN) 20 MG tablet, 2 (two) times a day. , Disp: , Rfl:   EXAM:  VITALS per patient if applicable: BP 038/88 P 81  GENERAL: alert, oriented, appears well and in no acute distress  HEENT: atraumatic, conjunttiva clear, no obvious abnormalities on inspection of external nose and ears  NECK: normal movements of the head and neck  LUNGS: on inspection no signs of respiratory distress, breathing rate appears normal, no obvious gross SOB, gasping or wheezing  CV: no obvious cyanosis  MS: moves all visible extremities without noticeable abnormality  PSYCH/NEURO: pleasant and cooperative, no obvious depression or anxiety, speech and thought processing grossly intact  ASSESSMENT AND PLAN:  Discussed the following assessment and plan:  Polyarthralgia -  Viral illness -so glad she is doing better -query post-viral syndrome -she did have COVID19 antibody testing through a research study which was neg -cont care with ortho for possible CTS - NVS pending  Obesity with serious comorbidity, unspecified classification, unspecified obesity type  Elevated blood-pressure reading, without diagnosis of hypertension Hyperlipidemia, unspecified hyperlipidemia type  -lifestyle recs, advised healthy diet and regular exercise    I discussed the assessment and treatment plan with the patient. The patient was provided an opportunity to ask questions and all were answered. The patient agreed with the plan and demonstrated an understanding of the instructions.   The patient was advised to call back or seek an in-person evaluation if the symptoms worsen or if the condition fails to improve as anticipated.   Follow up instructions: Advised assistant Wendie Simmer  to help patient arrange the following: -follow up 4-6 months  Lucretia Kern, DO '

## 2019-02-28 NOTE — Telephone Encounter (Signed)
-----   Message from Lucretia Kern, DO sent at 02/28/2019 12:59 PM EDT ----- -follow up 4-6 months

## 2019-03-27 ENCOUNTER — Encounter: Payer: Self-pay | Admitting: Rheumatology

## 2019-03-27 ENCOUNTER — Encounter: Payer: Self-pay | Admitting: Sports Medicine

## 2019-04-10 ENCOUNTER — Ambulatory Visit: Payer: PRIVATE HEALTH INSURANCE | Admitting: Rheumatology

## 2019-05-23 ENCOUNTER — Other Ambulatory Visit: Payer: Self-pay | Admitting: Orthopedic Surgery

## 2019-05-28 NOTE — Progress Notes (Signed)
Patient needs appt at some time with Dr. Ethlyn Gallery if plans to stay with [facility] for primary care. No Rush and she can see me for virtual visits if needs anything in the interim or after that visit. Thanks.

## 2019-07-02 ENCOUNTER — Other Ambulatory Visit: Payer: Self-pay

## 2019-07-03 NOTE — Progress Notes (Signed)
      Enhanced Recovery after Surgery for Orthopedics Enhanced Recovery after Surgery is a protocol used to improve the stress on your body and your recovery after surgery.  Patient Instructions  DRINK AT 10AM DOS.  [name] The night before surgery:  o No food after midnight. ONLY clear liquids after midnight  . The day of surgery (if you do NOT have diabetes):  o Drink ONE (1) Pre-Surgery Clear Ensure as directed.   o This drink was given to you during your hospital  pre-op appointment visit. o The pre-op nurse will instruct you on the time to drink the  Pre-Surgery Ensure depending on your surgery time. o Finish the drink at the designated time by the pre-op nurse.  o Nothing else to drink after completing the  Pre-Surgery Clear Ensure.  . The day of surgery (if you have diabetes): o Drink ONE (1) Gatorade 2 (G2) as directed. o This drink was given to you during your hospital  pre-op appointment visit.  o The pre-op nurse will instruct you on the time to drink the   Gatorade 2 (G2) depending on your surgery time. o Color of the Gatorade may vary. Red is not allowed. o Nothing else to drink after completing the  Gatorade 2 (G2).         If you have questions, please contact your surgeon's office.

## 2019-07-05 ENCOUNTER — Other Ambulatory Visit (HOSPITAL_COMMUNITY)
Admission: RE | Admit: 2019-07-05 | Discharge: 2019-07-05 | Disposition: A | Discharge status: Home / Self Care | Payer: PRIVATE HEALTH INSURANCE | Source: Ambulatory Visit | Attending: Orthopedic Surgery | Admitting: Orthopedic Surgery

## 2019-07-05 DIAGNOSIS — Z01812 Encounter for preprocedural laboratory examination: Secondary | ICD-10-CM | POA: Insufficient documentation

## 2019-07-05 DIAGNOSIS — Z20828 Contact with and (suspected) exposure to other viral communicable diseases: Secondary | ICD-10-CM | POA: Diagnosis not present

## 2019-07-07 LAB — NOVEL CORONAVIRUS, NAA (HOSP ORDER, SEND-OUT TO REF LAB; TAT 18-24 HRS): SARS-CoV-2, NAA: NOT DETECTED

## 2019-07-09 ENCOUNTER — Ambulatory Visit (HOSPITAL_BASED_OUTPATIENT_CLINIC_OR_DEPARTMENT_OTHER): Payer: No Typology Code available for payment source | Admitting: Anesthesiology

## 2019-07-09 ENCOUNTER — Ambulatory Visit (HOSPITAL_BASED_OUTPATIENT_CLINIC_OR_DEPARTMENT_OTHER)
Admission: RE | Admit: 2019-07-09 | Discharge: 2019-07-09 | Disposition: A | Discharge status: Home / Self Care | Payer: No Typology Code available for payment source | Attending: Orthopedic Surgery | Admitting: Orthopedic Surgery

## 2019-07-09 ENCOUNTER — Other Ambulatory Visit: Payer: Self-pay

## 2019-07-09 ENCOUNTER — Encounter (HOSPITAL_BASED_OUTPATIENT_CLINIC_OR_DEPARTMENT_OTHER): Payer: Self-pay | Admitting: *Deleted

## 2019-07-09 ENCOUNTER — Encounter (HOSPITAL_BASED_OUTPATIENT_CLINIC_OR_DEPARTMENT_OTHER)
Admission: RE | Disposition: A | Discharge status: Home / Self Care | Payer: Self-pay | Source: Home / Self Care | Attending: Orthopedic Surgery

## 2019-07-09 DIAGNOSIS — Z6841 Body Mass Index (BMI) 40.0 and over, adult: Secondary | ICD-10-CM | POA: Diagnosis not present

## 2019-07-09 DIAGNOSIS — F329 Major depressive disorder, single episode, unspecified: Secondary | ICD-10-CM | POA: Diagnosis not present

## 2019-07-09 DIAGNOSIS — K219 Gastro-esophageal reflux disease without esophagitis: Secondary | ICD-10-CM | POA: Insufficient documentation

## 2019-07-09 DIAGNOSIS — G5601 Carpal tunnel syndrome, right upper limb: Secondary | ICD-10-CM | POA: Insufficient documentation

## 2019-07-09 DIAGNOSIS — G43909 Migraine, unspecified, not intractable, without status migrainosus: Secondary | ICD-10-CM | POA: Insufficient documentation

## 2019-07-09 DIAGNOSIS — E669 Obesity, unspecified: Secondary | ICD-10-CM | POA: Diagnosis not present

## 2019-07-09 DIAGNOSIS — Z8249 Family history of ischemic heart disease and other diseases of the circulatory system: Secondary | ICD-10-CM | POA: Diagnosis not present

## 2019-07-09 DIAGNOSIS — Z87891 Personal history of nicotine dependence: Secondary | ICD-10-CM | POA: Insufficient documentation

## 2019-07-09 DIAGNOSIS — Z79899 Other long term (current) drug therapy: Secondary | ICD-10-CM | POA: Insufficient documentation

## 2019-07-09 HISTORY — PX: CARPAL TUNNEL RELEASE: SHX101

## 2019-07-09 SURGERY — CARPAL TUNNEL RELEASE
Anesthesia: Monitor Anesthesia Care | Site: Wrist | Laterality: Right

## 2019-07-09 MED ORDER — ACETAMINOPHEN 325 MG PO TABS: 325.0000 mg | ORAL_TABLET | ORAL | Status: DC | PRN

## 2019-07-09 MED ORDER — FENTANYL CITRATE (PF) 100 MCG/2ML IJ SOLN
INTRAMUSCULAR | Status: AC
  Filled 2019-07-09: qty 2

## 2019-07-09 MED ORDER — ACETAMINOPHEN 160 MG/5ML PO SOLN: 325.0000 mg | ORAL | Status: DC | PRN

## 2019-07-09 MED ORDER — LACTATED RINGERS IV SOLN
INTRAVENOUS | Status: DC
  Administered 2019-07-09: 12:00:00 via INTRAVENOUS

## 2019-07-09 MED ORDER — MIDAZOLAM HCL 2 MG/2ML IJ SOLN
INTRAMUSCULAR | Status: AC
  Filled 2019-07-09: qty 2

## 2019-07-09 MED ORDER — PROPOFOL 500 MG/50ML IV EMUL
INTRAVENOUS | Status: DC | PRN
  Administered 2019-07-09: 100 ug/kg/min via INTRAVENOUS

## 2019-07-09 MED ORDER — OXYCODONE HCL 5 MG/5ML PO SOLN: 5.0000 mg | Freq: Once | ORAL | Status: DC | PRN

## 2019-07-09 MED ORDER — ONDANSETRON HCL 4 MG/2ML IJ SOLN: 4.0000 mg | Freq: Once | INTRAMUSCULAR | Status: DC | PRN

## 2019-07-09 MED ORDER — FENTANYL CITRATE (PF) 100 MCG/2ML IJ SOLN: 25.0000 ug | INTRAMUSCULAR | Status: DC | PRN

## 2019-07-09 MED ORDER — EPHEDRINE 5 MG/ML INJ
INTRAVENOUS | Status: AC
  Filled 2019-07-09: qty 10

## 2019-07-09 MED ORDER — MEPERIDINE HCL 25 MG/ML IJ SOLN: 6.2500 mg | INTRAMUSCULAR | Status: DC | PRN

## 2019-07-09 MED ORDER — OXYCODONE HCL 5 MG PO TABS: 5.0000 mg | ORAL_TABLET | Freq: Once | ORAL | Status: DC | PRN

## 2019-07-09 MED ORDER — FENTANYL CITRATE (PF) 100 MCG/2ML IJ SOLN
INTRAMUSCULAR | Status: DC | PRN
  Administered 2019-07-09: 50 ug via INTRAVENOUS

## 2019-07-09 MED ORDER — 0.9 % SODIUM CHLORIDE (POUR BTL) OPTIME
TOPICAL | Status: DC | PRN
  Administered 2019-07-09: 100 mL

## 2019-07-09 MED ORDER — MIDAZOLAM HCL 5 MG/5ML IJ SOLN
INTRAMUSCULAR | Status: DC | PRN
  Administered 2019-07-09: 2 mg via INTRAVENOUS

## 2019-07-09 MED ORDER — BUPIVACAINE HCL (PF) 0.25 % IJ SOLN
INTRAMUSCULAR | Status: DC | PRN
  Administered 2019-07-09: 10 mL

## 2019-07-09 MED ORDER — CEFAZOLIN SODIUM-DEXTROSE 2-4 GM/100ML-% IV SOLN
2.0000 g | INTRAVENOUS | Status: AC
  Administered 2019-07-09: 2 g via INTRAVENOUS

## 2019-07-09 MED ORDER — CEFAZOLIN SODIUM-DEXTROSE 2-4 GM/100ML-% IV SOLN
INTRAVENOUS | Status: AC
  Filled 2019-07-09: qty 100

## 2019-07-09 MED ORDER — CHLORHEXIDINE GLUCONATE 4 % EX LIQD: 60.0000 mL | Freq: Once | CUTANEOUS | Status: DC

## 2019-07-09 MED ORDER — ONDANSETRON HCL 4 MG/2ML IJ SOLN
INTRAMUSCULAR | Status: AC
  Filled 2019-07-09: qty 4

## 2019-07-09 MED ORDER — DEXAMETHASONE SODIUM PHOSPHATE 10 MG/ML IJ SOLN
INTRAMUSCULAR | Status: AC
  Filled 2019-07-09: qty 1

## 2019-07-09 MED ORDER — ONDANSETRON HCL 4 MG/2ML IJ SOLN
INTRAMUSCULAR | Status: DC | PRN
  Administered 2019-07-09: 4 mg via INTRAVENOUS

## 2019-07-09 MED ORDER — HYDROCODONE-ACETAMINOPHEN 5-325 MG PO TABS: ORAL_TABLET | ORAL | 0 refills | Status: DC

## 2019-07-09 SURGICAL SUPPLY — 33 items
BLADE SURG 15 STRL LF DISP TIS (BLADE) ×2 IMPLANT
BLADE SURG 15 STRL SS (BLADE) ×4
BNDG ELASTIC 3X5.8 VLCR STR LF (GAUZE/BANDAGES/DRESSINGS) ×3 IMPLANT
BNDG ESMARK 4X9 LF (GAUZE/BANDAGES/DRESSINGS) IMPLANT
BNDG GAUZE ELAST 4 BULKY (GAUZE/BANDAGES/DRESSINGS) ×3 IMPLANT
CHLORAPREP W/TINT 26 (MISCELLANEOUS) ×3 IMPLANT
CORD BIPOLAR FORCEPS 12FT (ELECTRODE) ×3 IMPLANT
COVER BACK TABLE REUSABLE LG (DRAPES) ×3 IMPLANT
COVER MAYO STAND REUSABLE (DRAPES) ×3 IMPLANT
COVER WAND RF STERILE (DRAPES) IMPLANT
CUFF TOURN SGL QUICK 18X4 (TOURNIQUET CUFF) ×3 IMPLANT
DRAPE EXTREMITY T 121X128X90 (DISPOSABLE) ×3 IMPLANT
DRAPE SURG 17X23 STRL (DRAPES) ×3 IMPLANT
DRSG PAD ABDOMINAL 8X10 ST (GAUZE/BANDAGES/DRESSINGS) ×3 IMPLANT
GAUZE SPONGE 4X4 12PLY STRL (GAUZE/BANDAGES/DRESSINGS) ×3 IMPLANT
GAUZE XEROFORM 1X8 LF (GAUZE/BANDAGES/DRESSINGS) ×3 IMPLANT
GLOVE BIO SURGEON STRL SZ7.5 (GLOVE) ×3 IMPLANT
GLOVE BIOGEL PI IND STRL 8 (GLOVE) ×1 IMPLANT
GLOVE BIOGEL PI INDICATOR 8 (GLOVE) ×2
GOWN STRL REUS W/ TWL LRG LVL3 (GOWN DISPOSABLE) ×1 IMPLANT
GOWN STRL REUS W/TWL LRG LVL3 (GOWN DISPOSABLE) ×2
GOWN STRL REUS W/TWL XL LVL3 (GOWN DISPOSABLE) ×3 IMPLANT
NEEDLE HYPO 25X1 1.5 SAFETY (NEEDLE) ×3 IMPLANT
NS IRRIG 1000ML POUR BTL (IV SOLUTION) ×3 IMPLANT
PACK BASIN DAY SURGERY FS (CUSTOM PROCEDURE TRAY) ×3 IMPLANT
PADDING CAST ABS 4INX4YD NS (CAST SUPPLIES) ×2
PADDING CAST ABS COTTON 4X4 ST (CAST SUPPLIES) ×1 IMPLANT
STOCKINETTE 4X48 STRL (DRAPES) ×3 IMPLANT
SUT ETHILON 4 0 PS 2 18 (SUTURE) ×3 IMPLANT
SYR BULB 3OZ (MISCELLANEOUS) ×3 IMPLANT
SYR CONTROL 10ML LL (SYRINGE) ×3 IMPLANT
TOWEL GREEN STERILE FF (TOWEL DISPOSABLE) ×6 IMPLANT
UNDERPAD 30X36 HEAVY ABSORB (UNDERPADS AND DIAPERS) ×3 IMPLANT

## 2019-07-09 NOTE — Anesthesia Procedure Notes (Addendum)
Anesthesia Regional Block: Bier block (IV Regional)   Pre-Anesthetic Checklist: ,, timeout performed, Correct Patient, Correct Site, Correct Laterality, Correct Procedure, Correct Position, site marked, Risks and benefits discussed, Surgical consent, Pre-op evaluation  Laterality: Right  Prep: alcohol swabs        Procedures:,,,,, intact distal pulses, Esmarch exsanguination, single tourniquet utilized,  Narrative:   Performed by: [employer]

## 2019-07-09 NOTE — Discharge Instructions (Addendum)

## 2019-07-09 NOTE — H&P (Signed)
Diane Scott is an 43 y.o. female.   Chief Complaint: right carpal tunnel syndrome HPI: 43 yo female with numbness and tingling in hands.  Nocturnal symptoms.  Positive nerve conduction studies.  Improved symptoms after injection.  She wishes to have a right carpal tunnel release.  Allergies: No Known Allergies  Past Medical History:  Diagnosis Date  . Arthritis   . Atypical nevus 01/20/2015   left upper calf (moderate)  . Cystocele   . Depression   . Dysplasia of skin   . Gene mutation 10/05/2017   On screening for hemochromatosis due to [place] 2018 "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 Results for S65C were negative. The  mutations analyzed by [facility] are most common in the Caucasian  population. Although some patients with this genotype experience  biochemically defined abnormalities of iron overload, the penetrance  for clinical symptoms, such as cirrhosis, cardiomyopa  . GERD (gastroesophageal reflux disease)   . Migraine headache with aura 2012   hx numb face with migraine  . Obesity   . Osteoarthritis of both knees   . Tubular adenoma 05/12/2009    Past Surgical History:  Procedure Laterality Date  . ANTERIOR AND POSTERIOR REPAIR N/A 09/22/2015   Procedure: ANTERIOR (CYSTOCELE) AND POSTERIOR REPAIR (RECTOCELE) ;  Surgeon: Nunzio Cobbs, MD;  Location: [facility];  Service: Gynecology;  Laterality: N/A;  . BILATERAL SALPINGECTOMY Bilateral 01/12/2015   Procedure: BILATERAL SALPINGECTOMY;  Surgeon: Megan Salon, MD;  Location: [facility];  Service: Gynecology;  Laterality: Bilateral;  . BLADDER SUSPENSION N/A 09/22/2015   Procedure: TRANSVAGINAL TAPE (TVT) PROCEDURE;  Surgeon: Nunzio Cobbs, MD;  Location: [facility];  Service: Gynecology;  Laterality: N/A;  . CHOLECYSTECTOMY  2008  . COLONOSCOPY    . CYSTOSCOPY N/A 01/12/2015   Procedure: CYSTOSCOPY;  Surgeon: Megan Salon, MD;  Location: [facility];  Service: Gynecology;  Laterality: N/A;  .  CYSTOSCOPY N/A 09/22/2015   Procedure: CYSTOSCOPY;  Surgeon: Nunzio Cobbs, MD;  Location: [facility];  Service: Gynecology;  Laterality: N/A;  . endometrial biopsy     neg per patient  . LEG SKIN LESION  BIOPSY / EXCISION Left 01/20/2015   [facility]-moderate dysplasia per patient  . ORIF FINGER FRACTURE    . ROBOTIC ASSISTED TOTAL HYSTERECTOMY N/A 01/12/2015   Procedure: ROBOTIC ASSISTED TOTAL HYSTERECTOMY;  Surgeon: Megan Salon, MD;  Location: [facility];  Service: Gynecology;  Laterality: N/A;  . TUBAL LIGATION    . uterine ablation  2/13    Family History: Family History  Problem Relation Age of Onset  . Arthritis Mother   . Hypertension Mother   . Hyperlipidemia Mother   . Stroke Mother   . Arthritis Father   . Hyperlipidemia Father   . Hypertension Father   . Diabetes Father   . Melanoma Maternal Aunt   . Melanoma Paternal Uncle   . Arthritis Maternal Grandmother   . Alcohol abuse Maternal Grandfather   . Squamous cell carcinoma Maternal Grandfather   . Arthritis Paternal Grandmother   . Melanoma Paternal Grandmother   . Alcohol abuse Paternal Grandfather   . Squamous cell carcinoma Maternal Aunt   . Colon cancer Paternal Uncle   . Liver cancer Paternal Uncle     Social History:   reports that she quit smoking about 18 years ago. Her smoking use included cigarettes. She has a 15.00 pack-year smoking history. She has  never used smokeless tobacco. She reports current alcohol use. She reports that she does not use drugs.  Medications: Medications Prior to Admission  Medication Sig Dispense Refill  . cholecalciferol (VITAMIN D3) 25 MCG (1000 UT) tablet Take 1,000 Units by mouth daily.    . Diclofenac Sodium (PENNSAID) 2 % SOLN Place 1 application onto the skin 2 (two) times daily. 112 g 2  . ibuprofen (ADVIL) 200 MG tablet Take 200 mg by mouth every 6 (six) hours as needed.    . lamoTRIgine (LAMICTAL) 100 MG tablet Take 1 tablet by mouth daily.    .  methylphenidate (RITALIN) 20 MG tablet 2 (two) times a day.       No results found for this or any previous visit (from the past 48 hour(s)).  No results found.   A comprehensive review of systems was negative.  Blood pressure 113/81, pulse 79, temperature 97.9 F (36.6 C), temperature source Oral, resp. rate 16, height 5\' 6"  (1.676 m), weight 113.4 kg, last menstrual period 12/24/2014, SpO2 98 %.  General appearance: alert, cooperative and appears stated age Head: Normocephalic, without obvious abnormality, atraumatic Neck: supple, symmetrical, trachea midline Cardio: regular rate and rhythm Resp: clear to auscultation bilaterally Extremities: Intact sensation and capillary refill all digits.  +epl/fpl/io.  No wounds.  Pulses: 2+ and symmetric Skin: Skin color, texture, turgor normal. No rashes or lesions Neurologic: Grossly normal Incision/Wound: none  Assessment/Plan Right carpal tunnel syndrome.  Non operative and operative treatment options have been discussed with the patient and patient wishes to proceed with operative treatment. Risks, benefits, and alternatives of surgery have been discussed and the patient agrees with the plan of care.   Leanora Cover 07/09/2019, 12:52 PM

## 2019-07-09 NOTE — Anesthesia Postprocedure Evaluation (Signed)
Anesthesia Post Note  Patient: Diane Scott  Procedure(s) Performed: CARPAL TUNNEL RELEASE (Right Wrist)     Anesthesia Post Evaluation  Last Vitals:  Vitals:   07/09/19 1415 07/09/19 1430  BP: 133/67 (!) 149/86  Pulse: 72 71  Resp: (!) 22 18  Temp:  36.9 C  SpO2: 97% 100%    Last Pain:  Vitals:   07/09/19 1430  TempSrc:   PainSc: 0-No pain                 Rashonda Warrior

## 2019-07-09 NOTE — Anesthesia Procedure Notes (Signed)
Procedure Name: MAC Date/Time: 07/09/2019 1:44 PM Performed by: Signe Colt, CRNA Pre-anesthesia Checklist: Patient identified, Emergency Drugs available, Suction available, Patient being monitored and Timeout performed Patient Re-evaluated:Patient Re-evaluated prior to induction Oxygen Delivery Method: Simple face mask

## 2019-07-09 NOTE — Anesthesia Preprocedure Evaluation (Addendum)
Anesthesia Evaluation  Patient identified by MRN, date of birth, ID band Patient awake    Reviewed: Allergy & Precautions, NPO status , Patient's Chart, lab work & pertinent test results  Airway Mallampati: I  TM Distance: >3 FB Neck ROM: Full    Dental  (+) Teeth Intact, Dental Advisory Given   Pulmonary former smoker,    breath sounds clear to auscultation       Cardiovascular  Rhythm:Regular Rate:Normal     Neuro/Psych  Headaches, PSYCHIATRIC DISORDERS Depression  Neuromuscular disease    GI/Hepatic GERD  Medicated and Controlled,  Endo/Other  Morbid obesity  Renal/GU      Musculoskeletal   Abdominal   Peds  Hematology   Anesthesia Other Findings Elevated BS   Reproductive/Obstetrics                            Anesthesia Physical  Anesthesia Plan  ASA: II  Anesthesia Plan: Bier Block and MAC and Bier Block-LIDOCAINE ONLY   Post-op Pain Management:    Induction: Intravenous  PONV Risk Score and Plan: 2  Airway Management Planned: Nasal Cannula, Simple Face Mask and Mask  Additional Equipment:   Intra-op Plan:   Post-operative Plan: Extubation in OR  Informed Consent: I have reviewed the patients History and Physical, chart, labs and discussed the procedure including the risks, benefits and alternatives for the proposed anesthesia with the patient or authorized representative who has indicated his/her understanding and acceptance.     Dental advisory given  Plan Discussed with: CRNA, Anesthesiologist and Surgeon  Anesthesia Plan Comments:        Anesthesia Quick Evaluation

## 2019-07-09 NOTE — Transfer of Care (Signed)
Immediate Anesthesia Transfer of Care Note  Patient: Diane Scott  Procedure(s) Performed: CARPAL TUNNEL RELEASE (Right Wrist)  Patient Location: PACU  Anesthesia Type:General  Level of Consciousness: awake  Airway & Oxygen Therapy: Patient connected to nasal cannula oxygen  Post-op Assessment: Post -op Vital signs reviewed and stable  Post vital signs: stable  Last Vitals:  Vitals Value Taken Time  BP 149/86 07/09/19 1430  Temp 36.9 C 07/09/19 1430  Pulse 71 07/09/19 1430  Resp 18 07/09/19 1430  SpO2 100 % 07/09/19 1430    Last Pain:  Vitals:   07/09/19 1430  TempSrc:   PainSc: 0-No pain      Patients Stated Pain Goal: 3 ([id number])  Complications: No apparent anesthesia complications

## 2019-07-09 NOTE — Op Note (Signed)
07/09/2019 [facility]                              OPERATIVE REPORT   PREOPERATIVE DIAGNOSIS:  Right carpal tunnel syndrome.  POSTOPERATIVE DIAGNOSIS:  Right carpal tunnel syndrome.  PROCEDURE:  Right carpal tunnel release.  SURGEON:  Leanora Cover, MD  ASSISTANT:  none.  ANESTHESIA: Bier block with sedation  IV FLUIDS:  Per anesthesia flow sheet.  ESTIMATED BLOOD LOSS:  Minimal.  COMPLICATIONS:  None.  SPECIMENS:  None.  TOURNIQUET TIME:    Total Tourniquet Time Documented: Forearm (Right) - 22 minutes Total: Forearm (Right) - 22 minutes   DISPOSITION:  Stable to PACU.  LOCATION: [facility]  INDICATIONS:  43 yo female with numbness and tingling in right hand.  Nocturnal symptoms.  Positive nerve conduction studies and improvement with injection.  She wishes to have a carpal tunnel release for management of her symptoms.  Risks, benefits and alternatives of surgery were discussed including the risk of blood loss; infection; damage to nerves, vessels, tendons, ligaments, bone; failure of surgery; need for additional surgery; complications with wound healing; continued pain; recurrence of carpal tunnel syndrome; and damage to motor branch. She voiced understanding of these risks and elected to proceed.   OPERATIVE COURSE:  After being identified preoperatively by myself, the patient and I agreed upon the procedure and site of procedure.  The surgical site was marked.  The risks, benefits, and alternatives of the surgery were reviewed and she wished to proceed.  Surgical consent had been signed.  She was given IV Ancef as preoperative antibiotic prophylaxis.  She was transferred to the operating room and placed on the operating room table in supine position with the Right upper extremity on an armboard.  Bier block anesthesia was induced by the anesthesiologist.  Right upper extremity was prepped and draped in normal sterile orthopaedic fashion.  A  surgical pause was performed between the surgeons, anesthesia, and operating room staff, and all were in agreement as to the patient, procedure, and site of procedure.  Tourniquet at the proximal aspect of the forearm had been inflated for the Bier block  Incision was made over the transverse carpal ligament and carried into the subcutaneous tissues by spreading technique.  Bipolar electrocautery was used to obtain hemostasis.  The palmar fascia was sharply incised.  The transverse carpal ligament was identified and sharply incised.  It was incised distally first.  Care was taken to ensure complete decompression distally.  It was then incised proximally.  Scissors were used to split the distal aspect of the volar antebrachial fascia.  A finger was placed into the wound to ensure complete decompression, which was the case.  The nerve was examined.  It was flattened and hyperemic and It was adherent to the radial leaflet.  The motor branch was identified and was intact.  The wound was copiously irrigated with sterile saline.  It was then closed with 4-0 nylon in a horizontal mattress fashion.  It was injected with 0.25% plain Marcaine to aid in postoperative analgesia.  It was dressed with sterile Xeroform, 4x4s, an ABD, and wrapped with Kerlix and an Ace bandage.  Tourniquet was deflated at 22 minutes.  Fingertips were pink with brisk capillary refill after deflation of the tourniquet.  Operative drapes were broken down.  The patient was awoken from anesthesia safely.  She was transferred back to stretcher and taken to  the PACU in stable condition.  I will see her back in the office in 1 week for postoperative followup.  I will give her a prescription for Norco 5/325 1-2 tabs PO q6 hours prn pain, dispense # 15.    Leanora Cover, MD Electronically signed, 07/09/19

## 2019-07-10 ENCOUNTER — Encounter (HOSPITAL_BASED_OUTPATIENT_CLINIC_OR_DEPARTMENT_OTHER): Payer: Self-pay | Admitting: Orthopedic Surgery

## 2019-09-12 ENCOUNTER — Other Ambulatory Visit: Payer: Self-pay

## 2019-09-13 ENCOUNTER — Ambulatory Visit (INDEPENDENT_AMBULATORY_CARE_PROVIDER_SITE_OTHER): Payer: PRIVATE HEALTH INSURANCE | Admitting: Family Medicine

## 2019-09-13 ENCOUNTER — Encounter: Payer: Self-pay | Admitting: Family Medicine

## 2019-09-13 VITALS — Temp 98.0°F | Wt 247.0 lb

## 2019-09-13 DIAGNOSIS — Z131 Encounter for screening for diabetes mellitus: Secondary | ICD-10-CM | POA: Diagnosis not present

## 2019-09-13 DIAGNOSIS — R5383 Other fatigue: Secondary | ICD-10-CM

## 2019-09-13 DIAGNOSIS — E559 Vitamin D deficiency, unspecified: Secondary | ICD-10-CM | POA: Insufficient documentation

## 2019-09-13 DIAGNOSIS — G43909 Migraine, unspecified, not intractable, without status migrainosus: Secondary | ICD-10-CM | POA: Diagnosis not present

## 2019-09-13 DIAGNOSIS — F331 Major depressive disorder, recurrent, moderate: Secondary | ICD-10-CM | POA: Diagnosis not present

## 2019-09-13 DIAGNOSIS — E785 Hyperlipidemia, unspecified: Secondary | ICD-10-CM | POA: Insufficient documentation

## 2019-09-13 DIAGNOSIS — Z1589 Genetic susceptibility to other disease: Secondary | ICD-10-CM

## 2019-09-13 NOTE — Progress Notes (Signed)
Virtual Visit via Video Note  I connected with Diane Scott   on 09/13/19 at  1:00 PM EST by a video enabled telemedicine application and verified that I am speaking with the correct person using two identifiers.  Location patient: home Location provider:work office Persons participating in the virtual visit: patient, provider  I discussed the limitations of evaluation and management by telemedicine and the availability of in person appointments. The patient expressed understanding and agreed to proceed.   Diane Scott DOB: [birth year] Encounter date: 09/13/2019  This is a 44 y.o. female who presents to establish care. Chief Complaint  Patient presents with  . Establish Care    History of present illness: Still not convinced that sx she went through last year weren't COVID. No more fevers since June. Everything has resolved. She is feeling well now. Thinks switched to virtual due to sx of second COVID vaccine.   Migraine: intermittent; resolves with tylenol or ibuprofen.   Acid reflux: not really an issue for her anymore. Just avoids eating late.   Depression: Follows with Dr. Toy Care. Has history of depression for at least 13 years. Had long term follow up and tx in [place] before moving here. Went off meds in 2014; but depression flared again in 2015 and ended up back on medication. Lamictal and ritalin are both for treating depression and has been on this combination since 2010.   Follows with Dr. Sabra Heck for gyn needs.   Did follow with derm; but it has been awhile.  Insurance plan has changed that she is looking to establish with somebody in network.   Past Medical History:  Diagnosis Date  . Arthritis   . Atypical nevus 01/20/2015   left upper calf (moderate)  . Carpal tunnel syndrome 10/05/2009  . Cystocele   . Cystocele, lateral 12/23/2010  . Depression   . Dysplasia of skin   . Gene mutation 10/05/2017   On screening for hemochromatosis due to [place] 2018 "One copy of C282Y and  one copy  of H63D were identified. Results for S65C were negative. The  mutations analyzed by [facility] are most common in the Caucasian  population. Although some patients with this genotype experience  biochemically defined abnormalities of iron overload, the penetrance  for clinical symptoms, such as cirrhosis, cardiomyopa  . GERD (gastroesophageal reflux disease)   . Migraine headache with aura 2012   hx numb face with migraine  . Obesity   . Osteoarthritis of both knees   . Tubular adenoma 05/12/2009   Past Surgical History:  Procedure Laterality Date  . ANTERIOR AND POSTERIOR REPAIR N/A 09/22/2015   Procedure: ANTERIOR (CYSTOCELE) AND POSTERIOR REPAIR (RECTOCELE) ;  Surgeon: Nunzio Cobbs, MD;  Location: [facility];  Service: Gynecology;  Laterality: N/A;  . BILATERAL SALPINGECTOMY Bilateral 01/12/2015   Procedure: BILATERAL SALPINGECTOMY;  Surgeon: Megan Salon, MD;  Location: [facility];  Service: Gynecology;  Laterality: Bilateral;  . BLADDER SUSPENSION N/A 09/22/2015   Procedure: TRANSVAGINAL TAPE (TVT) PROCEDURE;  Surgeon: Nunzio Cobbs, MD;  Location: [facility];  Service: Gynecology;  Laterality: N/A;  . CARPAL TUNNEL RELEASE Right 07/09/2019   Procedure: CARPAL TUNNEL RELEASE;  Surgeon: Leanora Cover, MD;  Location: [facility];  Service: Orthopedics;  Laterality: Right;  . CHOLECYSTECTOMY  2008  . COLONOSCOPY    . CYSTOSCOPY N/A 01/12/2015   Procedure: CYSTOSCOPY;  Surgeon: Megan Salon, MD;  Location: [facility];  Service: Gynecology;  Laterality: N/A;  .  CYSTOSCOPY N/A 09/22/2015   Procedure: CYSTOSCOPY;  Surgeon: Nunzio Cobbs, MD;  Location: [facility];  Service: Gynecology;  Laterality: N/A;  . endometrial biopsy     neg per patient  . LEG SKIN LESION  BIOPSY / EXCISION Left 01/20/2015   [facility]-moderate dysplasia per patient  . ORIF FINGER FRACTURE    . ROBOTIC ASSISTED TOTAL HYSTERECTOMY N/A 01/12/2015   Procedure: ROBOTIC ASSISTED  TOTAL HYSTERECTOMY;  Surgeon: Megan Salon, MD;  Location: [facility];  Service: Gynecology;  Laterality: N/A;  . uterine ablation  2/13   No Known Allergies Current Meds  Medication Sig  . cholecalciferol (VITAMIN D3) 25 MCG (1000 UT) tablet Take 1,000 Units by mouth daily.  . Diclofenac Sodium (PENNSAID) 2 % SOLN Place 1 application onto the skin 2 (two) times daily.  [name] HYDROcodone-acetaminophen (NORCO) 5-325 MG tablet 1-2 tabs po q6 hours prn pain  . ibuprofen (ADVIL) 200 MG tablet Take 200 mg by mouth every 6 (six) hours as needed.  . lamoTRIgine (LAMICTAL) 100 MG tablet Take 1 tablet by mouth daily.  . methylphenidate (RITALIN) 20 MG tablet 2 (two) times a day.    Social History   Tobacco Use  . Smoking status: Former Smoker    Packs/day: 1.00    Years: 15.00    Pack years: 15.00    Types: Cigarettes    Quit date: 09/22/2000    Years since quitting: 18.9  . Smokeless tobacco: Never Used  Substance Use Topics  . Alcohol use: Yes    Comment: occ   Family History  Problem Relation Age of Onset  . Arthritis Mother   . Hypertension Mother   . Hyperlipidemia Mother   . Stroke Mother 64  . Other Mother        hereditary chromatosis carrier  . Arthritis Father   . Hyperlipidemia Father   . Hypertension Father   . Diabetes Father   . Squamous cell carcinoma Father   . Other Father        herediatry chromatosis carrier  . Melanoma Maternal Aunt   . Melanoma Paternal Uncle   . Arthritis Maternal Grandmother   . Cirrhosis Maternal Grandmother   . Alcohol abuse Maternal Grandfather   . Squamous cell carcinoma Maternal Grandfather   . Arthritis Paternal Grandmother   . Melanoma Paternal Grandmother   . Alcohol abuse Paternal Grandfather   . Squamous cell carcinoma Maternal Aunt   . Colon cancer Paternal Uncle   . Liver cancer Paternal Uncle   . Hepatitis C Paternal Uncle   . Cirrhosis Paternal Uncle   . Hemochromatosis Paternal Uncle      Review of Systems   Constitutional: Negative for chills, fatigue and fever.  Respiratory: Negative for cough, chest tightness, shortness of breath and wheezing.   Cardiovascular: Negative for chest pain, palpitations and leg swelling.    Objective:  Temp 98 F (36.7 C)   Wt 247 lb (112 kg)   LMP 12/24/2014   BMI 39.87 kg/m   Weight: 247 lb (112 kg)   BP Readings from Last 3 Encounters:  07/09/19 (!) 149/86  02/20/19 127/86  02/05/19 112/75   Wt Readings from Last 3 Encounters:  09/13/19 247 lb (112 kg)  07/09/19 250 lb (113.4 kg)  02/20/19 249 lb (112.9 kg)    EXAM:  GENERAL: alert, oriented, appears well and in no acute distress  HEENT: atraumatic, conjunctiva clear, no obvious abnormalities on inspection of external nose and ears  NECK: normal movements of the head and neck  LUNGS: on inspection no signs of respiratory distress, breathing rate appears normal, no obvious gross SOB, gasping or wheezing  CV: no obvious cyanosis  MS: moves all visible extremities without noticeable abnormality  PSYCH/NEURO: pleasant and cooperative, no obvious depression or anxiety, speech and thought processing grossly intact  SKIN: No facial or neck abnormalities noted.  Assessment/Plan  1. Fatigue, unspecified type We will add on some additional blood work.  Feels that her level of fatigue is somewhat normal.   - CBC with Differential/Platelet; Future - Comprehensive metabolic panel; Future - Vitamin B12; Future  2. Migraine without status migrainosus, not intractable, unspecified migraine type Well-controlled with over-the-counter medication intermittently.  3. Moderate episode of recurrent major depressive disorder (HCC) Well-controlled on Lamictal and Ritalin.  Follows with psychiatry on a regular basis.  4. Screening for diabetes mellitus - Hemoglobin A1c; Future  5. Hyperlipidemia, unspecified hyperlipidemia type - Lipid panel; Future - TSH; Future  6. Gene mutation - IBC +  Ferritin; Future  7. Vitamin D deficiency - VITAMIN D 25 Hydroxy (Vit-D Deficiency, Fractures); Future  ok to set up physical later in year since she follows with gynecology and will be scheduling her own mammogram as well has an appointment with dermatology.  I discussed the assessment and treatment plan with the patient. The patient was provided an opportunity to ask questions and all were answered. The patient agreed with the plan and demonstrated an understanding of the instructions.   The patient was advised to call back or seek an in-person evaluation if the symptoms worsen or if the condition fails to improve as anticipated.  I provided 25 minutes of non-face-to-face time during this encounter.   Micheline Rough, MD

## 2019-09-16 ENCOUNTER — Telehealth: Payer: Self-pay | Admitting: *Deleted

## 2019-09-16 NOTE — Telephone Encounter (Signed)
-----   Message from Caren Macadam, MD sent at 09/13/2019  1:31 PM EST ----- Please help her set up lab visit.

## 2019-09-16 NOTE — Telephone Encounter (Signed)
Spoke with the pt and scheduled a lab appt for 2/9 to arrive at 8:30am.

## 2019-09-30 ENCOUNTER — Other Ambulatory Visit: Payer: Self-pay

## 2019-10-01 ENCOUNTER — Other Ambulatory Visit (INDEPENDENT_AMBULATORY_CARE_PROVIDER_SITE_OTHER): Payer: PRIVATE HEALTH INSURANCE

## 2019-10-01 DIAGNOSIS — Z1589 Genetic susceptibility to other disease: Secondary | ICD-10-CM

## 2019-10-01 DIAGNOSIS — R5383 Other fatigue: Secondary | ICD-10-CM | POA: Diagnosis not present

## 2019-10-01 DIAGNOSIS — E785 Hyperlipidemia, unspecified: Secondary | ICD-10-CM

## 2019-10-01 DIAGNOSIS — E559 Vitamin D deficiency, unspecified: Secondary | ICD-10-CM

## 2019-10-01 DIAGNOSIS — Z131 Encounter for screening for diabetes mellitus: Secondary | ICD-10-CM | POA: Diagnosis not present

## 2019-10-01 LAB — CBC WITH DIFFERENTIAL/PLATELET
Basophils Absolute: 0 10*3/uL (ref 0.0–0.1)
Basophils Relative: 0.5 % (ref 0.0–3.0)
Eosinophils Absolute: 0.2 10*3/uL (ref 0.0–0.7)
Eosinophils Relative: 2.9 % (ref 0.0–5.0)
HCT: 39.1 % (ref 36.0–46.0)
Hemoglobin: 12.9 g/dL (ref 12.0–15.0)
Lymphocytes Relative: 31 % (ref 12.0–46.0)
Lymphs Abs: 2.5 10*3/uL (ref 0.7–4.0)
MCHC: 33.1 g/dL (ref 30.0–36.0)
MCV: 86.8 fl (ref 78.0–100.0)
Monocytes Absolute: 0.6 10*3/uL (ref 0.1–1.0)
Monocytes Relative: 7.2 % (ref 3.0–12.0)
Neutro Abs: 4.7 10*3/uL (ref 1.4–7.7)
Neutrophils Relative %: 58.4 % (ref 43.0–77.0)
Platelets: 260 10*3/uL (ref 150.0–400.0)
RBC: 4.5 Mil/uL (ref 3.87–5.11)
RDW: 13.5 % (ref 11.5–15.5)
WBC: 8.1 10*3/uL (ref 4.0–10.5)

## 2019-10-01 LAB — COMPREHENSIVE METABOLIC PANEL
ALT: 36 U/L — ABNORMAL HIGH (ref 0–35)
AST: 26 U/L (ref 0–37)
Albumin: 3.8 g/dL (ref 3.5–5.2)
Alkaline Phosphatase: 55 U/L (ref 39–117)
BUN: 15 mg/dL (ref 6–23)
CO2: 27 mEq/L (ref 19–32)
Calcium: 9 mg/dL (ref 8.4–10.5)
Chloride: 104 mEq/L (ref 96–112)
Creatinine, Ser: 0.67 mg/dL (ref 0.40–1.20)
GFR: 95.33 mL/min (ref >60.00)
Glucose, Bld: 110 mg/dL — ABNORMAL HIGH (ref 70–99)
Potassium: 4.6 mEq/L (ref 3.5–5.1)
Sodium: 137 mEq/L (ref 135–145)
Total Bilirubin: 0.3 mg/dL (ref 0.2–1.2)
Total Protein: 6.1 g/dL (ref 6.0–8.3)

## 2019-10-01 LAB — LIPID PANEL
Cholesterol: 182 mg/dL (ref 0–200)
HDL: 48.2 mg/dL (ref >39.00)
LDL Cholesterol: 108 mg/dL — ABNORMAL HIGH (ref 0–99)
NonHDL: 133.97
Total CHOL/HDL Ratio: 4
Triglycerides: 132 mg/dL (ref 0.0–149.0)
VLDL: 26.4 mg/dL (ref 0.0–40.0)

## 2019-10-01 LAB — IBC + FERRITIN
Ferritin: 39.9 ng/mL (ref 10.0–291.0)
Iron: 51 ug/dL (ref 42–145)
Saturation Ratios: 15.9 % — ABNORMAL LOW (ref 20.0–50.0)
Transferrin: 229 mg/dL (ref 212.0–360.0)

## 2019-10-01 LAB — VITAMIN D 25 HYDROXY (VIT D DEFICIENCY, FRACTURES): VITD: 26.68 ng/mL — ABNORMAL LOW (ref 30.00–100.00)

## 2019-10-01 LAB — HEMOGLOBIN A1C: Hgb A1c MFr Bld: 5.9 % (ref 4.6–6.5)

## 2019-10-01 LAB — VITAMIN B12: Vitamin B-12: 340 pg/mL (ref 211–911)

## 2019-10-01 LAB — TSH: TSH: 4.05 u[IU]/mL (ref 0.35–4.50)

## 2019-10-04 NOTE — Progress Notes (Signed)
Noted. Gene mutation for hemochromatosis, but iron stores actually low. Can discuss at visit/monitor

## 2019-10-11 ENCOUNTER — Other Ambulatory Visit: Payer: Self-pay | Admitting: Family Medicine

## 2019-10-11 DIAGNOSIS — Z1231 Encounter for screening mammogram for malignant neoplasm of breast: Secondary | ICD-10-CM

## 2019-10-15 ENCOUNTER — Ambulatory Visit
Admission: RE | Admit: 2019-10-15 | Discharge: 2019-10-15 | Disposition: A | Discharge status: Home / Self Care | Payer: PRIVATE HEALTH INSURANCE | Source: Ambulatory Visit

## 2019-10-15 ENCOUNTER — Other Ambulatory Visit: Payer: Self-pay

## 2019-10-15 DIAGNOSIS — Z1231 Encounter for screening mammogram for malignant neoplasm of breast: Secondary | ICD-10-CM

## 2019-10-16 ENCOUNTER — Ambulatory Visit
Admission: RE | Admit: 2019-10-16 | Discharge: 2019-10-16 | Disposition: A | Discharge status: Home / Self Care | Payer: PRIVATE HEALTH INSURANCE | Source: Ambulatory Visit | Attending: Family Medicine | Admitting: Family Medicine

## 2019-10-16 ENCOUNTER — Encounter: Payer: Self-pay | Admitting: Family Medicine

## 2019-10-16 DIAGNOSIS — N6310 Unspecified lump in the right breast, unspecified quadrant: Secondary | ICD-10-CM

## 2019-10-16 NOTE — Telephone Encounter (Signed)
See her message. Usually they are so quick about the follow up scheduling that they call and talk with her before I do, but it looks like they want a right Axillary ultrasound as follow up. Please order for her if this will help facilitate imaging. Please check with [facility] imaging as well to make sure no additional images needed at this time; since this is usually something they arrange. I will be in touch with her again then after we see the [place] results.

## 2019-12-12 ENCOUNTER — Other Ambulatory Visit: Payer: Self-pay

## 2019-12-13 ENCOUNTER — Other Ambulatory Visit: Payer: Self-pay

## 2019-12-13 ENCOUNTER — Ambulatory Visit: Payer: PRIVATE HEALTH INSURANCE | Admitting: Obstetrics & Gynecology

## 2019-12-13 ENCOUNTER — Encounter: Payer: Self-pay | Admitting: Obstetrics & Gynecology

## 2019-12-13 VITALS — BP 110/72 | HR 68 | Temp 97.7°F | Resp 16 | Ht 65.75 in | Wt 228.0 lb

## 2019-12-13 DIAGNOSIS — Z01419 Encounter for gynecological examination (general) (routine) without abnormal findings: Secondary | ICD-10-CM

## 2019-12-13 NOTE — Progress Notes (Signed)
44 y.o. G57P2002 Married White or Caucasian female here for annual exam.  Work has been stressful.  Seeing a lot of patients with alcoholic cirrhosis.  Her younger daughter was hospitalized due to depression, concerns about self harm.    Denies vaginal bleeding.  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  She is going to [employer] in [place], [place].    Had MMG after Covid vaccination.  Had 3.5mm normal appearing node in her right axilla.    Had extensive blood work with Dr. Ethlyn Gallery    She's working on weight loss this year.  Doing Weight Watchers.    Patient's last menstrual period was 12/24/2014.          Sexually active: Yes.    The current method of family planning is status post hysterectomy.    Exercising: Yes.    walking Smoker:  no  Health Maintenance: Pap:  10-23-14 neg HPV HR neg History of abnormal Pap:  yes  MMG:  10-15-2019 bilateral & rt breast u/s birads 2:neg Colonoscopy:  09-13-16 f/u 10 yrs TDaP:  2013 Screening Labs: 10/01/2019   reports that she quit smoking about 19 years ago. Her smoking use included cigarettes. She has a 15.00 pack-year smoking history. She has never used smokeless tobacco. She reports previous alcohol use. She reports that she does not use drugs.  Past Medical History:  Diagnosis Date  . Abnormal Pap smear of cervix   . Arthritis   . Atypical nevus 01/20/2015   left upper calf (moderate)  . Carpal tunnel syndrome 10/05/2009  . Cystocele   . Cystocele, lateral 12/23/2010  . Depression   . Dysplasia of skin   . Gene mutation 10/05/2017   On screening for hemochromatosis due to [place] 2018 "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 Results for S65C were negative. The  mutations analyzed by [facility] are most common in the Caucasian  population. Although some patients with this genotype experience  biochemically defined abnormalities of iron overload, the penetrance  for clinical symptoms, such as cirrhosis, cardiomyopa  . GERD (gastroesophageal reflux  disease)   . Migraine headache with aura 2012   hx numb face with migraine  . Obesity   . Osteoarthritis of both knees   . Tubular adenoma 05/12/2009    Past Surgical History:  Procedure Laterality Date  . ANTERIOR AND POSTERIOR REPAIR N/A 09/22/2015   Procedure: ANTERIOR (CYSTOCELE) AND POSTERIOR REPAIR (RECTOCELE) ;  Surgeon: Nunzio Cobbs, MD;  Location: [facility];  Service: Gynecology;  Laterality: N/A;  . BILATERAL SALPINGECTOMY Bilateral 01/12/2015   Procedure: BILATERAL SALPINGECTOMY;  Surgeon: Megan Salon, MD;  Location: [facility];  Service: Gynecology;  Laterality: Bilateral;  . BLADDER SUSPENSION N/A 09/22/2015   Procedure: TRANSVAGINAL TAPE (TVT) PROCEDURE;  Surgeon: Nunzio Cobbs, MD;  Location: [facility];  Service: Gynecology;  Laterality: N/A;  . CARPAL TUNNEL RELEASE Right 07/09/2019   Procedure: CARPAL TUNNEL RELEASE;  Surgeon: Leanora Cover, MD;  Location: [facility];  Service: Orthopedics;  Laterality: Right;  . CHOLECYSTECTOMY  2008  . COLONOSCOPY    . CYSTOSCOPY N/A 01/12/2015   Procedure: CYSTOSCOPY;  Surgeon: Megan Salon, MD;  Location: [facility];  Service: Gynecology;  Laterality: N/A;  . CYSTOSCOPY N/A 09/22/2015   Procedure: CYSTOSCOPY;  Surgeon: Nunzio Cobbs, MD;  Location: [facility];  Service: Gynecology;  Laterality: N/A;  . endometrial biopsy     neg per patient  .  LEG SKIN LESION  BIOPSY / EXCISION Left 01/20/2015   [facility]-moderate dysplasia per patient  . ORIF FINGER FRACTURE    . ROBOTIC ASSISTED TOTAL HYSTERECTOMY N/A 01/12/2015   Procedure: ROBOTIC ASSISTED TOTAL HYSTERECTOMY;  Surgeon: Megan Salon, MD;  Location: [facility];  Service: Gynecology;  Laterality: N/A;  . uterine ablation  2/13    Current Outpatient Medications  Medication Sig Dispense Refill  . Diclofenac Sodium (PENNSAID) 2 % SOLN Place 1 application onto the skin 2 (two) times daily. 112 g 2  . ibuprofen (ADVIL) 200 MG tablet Take 200 mg by  mouth every 6 (six) hours as needed.    . lamoTRIgine (LAMICTAL) 100 MG tablet Take 1 tablet by mouth daily.    . methylphenidate (RITALIN) 20 MG tablet 2 (two) times a day.     [name] VITAMIN D PO Take 5,000 Int'l Units by mouth.     No current facility-administered medications for this visit.    Family History  Problem Relation Age of Onset  . Arthritis Mother   . Hypertension Mother   . Hyperlipidemia Mother   . Stroke Mother 65  . Other Mother        hereditary chromatosis carrier  . Arthritis Father   . Hyperlipidemia Father   . Hypertension Father   . Diabetes Father   . Squamous cell carcinoma Father   . Other Father        herediatry chromatosis carrier  . Melanoma Maternal Aunt   . Melanoma Paternal Uncle   . Arthritis Maternal Grandmother   . Cirrhosis Maternal Grandmother   . Alcohol abuse Maternal Grandfather   . Squamous cell carcinoma Maternal Grandfather   . Arthritis Paternal Grandmother   . Melanoma Paternal Grandmother   . Alcohol abuse Paternal Grandfather   . Squamous cell carcinoma Maternal Aunt   . Colon cancer Paternal Uncle   . Liver cancer Paternal Uncle   . Hepatitis C Paternal Uncle   . Cirrhosis Paternal Uncle   . Hemochromatosis Paternal Uncle     Review of Systems  Constitutional: Negative.   HENT: Negative.   Eyes: Negative.   Respiratory: Negative.   Cardiovascular: Negative.   Gastrointestinal: Negative.   Endocrine: Negative.   Genitourinary: Negative.   Musculoskeletal: Negative.   Skin: Negative.   Allergic/Immunologic: Negative.   Neurological: Negative.   Psychiatric/Behavioral: Negative.     Exam:   BP 110/72   Pulse 68   Temp 97.7 F (36.5 C) (Skin)   Resp 16   Ht 5' 5.75" (1.67 m)   Wt 228 lb (103.4 kg)   LMP 12/24/2014   BMI 37.08 kg/m   Height: 5' 5.75" (167 cm)  General appearance: alert, cooperative and appears stated age Head: Normocephalic, without obvious abnormality, atraumatic Neck: no adenopathy,  supple, symmetrical, trachea midline and thyroid normal to inspection and palpation Lungs: clear to auscultation bilaterally Breasts: normal appearance, no masses or tenderness Heart: regular rate and rhythm Abdomen: soft, non-tender; bowel sounds normal; no masses,  no organomegaly Extremities: extremities normal, atraumatic, no cyanosis or edema Skin: Skin color, texture, turgor normal. No rashes or lesions Lymph nodes: Cervical, supraclavicular, and axillary nodes normal. No abnormal inguinal nodes palpated Neurologic: Grossly normal   Pelvic: External genitalia:  no lesions              Urethra:  normal appearing urethra with no masses, tenderness or lesions              Bartholins  and Skenes: normal                 Vagina: normal appearing vagina with normal color and discharge, no lesions              Cervix: absent              Pap taken: No. Bimanual Exam:  Uterus:  uterus absent              Adnexa: normal adnexa and no mass, fullness, tenderness               Rectovaginal: Confirms               Anus:  normal sphincter tone, no lesions  Chaperone, Royal Hawthorn, CMA, was present for exam.  A:  Well Woman with normal exam H/o robotic TLH/bilateral salpingectomy 3/16 and then A&P repair 1/17 Family hx of hemochromatosis H/O migraines with aura Family hx of melanoma Working on weight loss  P:   Mammogram guidelines reviewed.   pap smear not indicated Colon cancer screening guidelines discussed.  Had colonoscopy 2018.   Blood work done with Dr. Ethlyn Gallery in January Return annually or prn

## 2020-03-13 ENCOUNTER — Encounter: Payer: Self-pay | Admitting: Obstetrics & Gynecology

## 2020-03-16 ENCOUNTER — Ambulatory Visit (HOSPITAL_COMMUNITY): Payer: Self-pay

## 2020-09-22 ENCOUNTER — Other Ambulatory Visit: Payer: Self-pay | Admitting: Family Medicine

## 2020-09-22 DIAGNOSIS — Z1231 Encounter for screening mammogram for malignant neoplasm of breast: Secondary | ICD-10-CM

## 2020-10-19 ENCOUNTER — Encounter: Payer: PRIVATE HEALTH INSURANCE | Admitting: Family Medicine

## 2020-10-20 DIAGNOSIS — Z1231 Encounter for screening mammogram for malignant neoplasm of breast: Secondary | ICD-10-CM

## 2020-10-22 ENCOUNTER — Other Ambulatory Visit: Payer: Self-pay

## 2020-10-23 ENCOUNTER — Encounter: Payer: Self-pay | Admitting: Family Medicine

## 2020-10-23 ENCOUNTER — Ambulatory Visit (INDEPENDENT_AMBULATORY_CARE_PROVIDER_SITE_OTHER): Payer: PRIVATE HEALTH INSURANCE | Admitting: Family Medicine

## 2020-10-23 ENCOUNTER — Ambulatory Visit (INDEPENDENT_AMBULATORY_CARE_PROVIDER_SITE_OTHER): Payer: PRIVATE HEALTH INSURANCE

## 2020-10-23 VITALS — BP 110/78 | HR 68 | Temp 98.4°F | Ht 65.25 in | Wt 218.6 lb

## 2020-10-23 DIAGNOSIS — E559 Vitamin D deficiency, unspecified: Secondary | ICD-10-CM

## 2020-10-23 DIAGNOSIS — M25512 Pain in left shoulder: Secondary | ICD-10-CM

## 2020-10-23 DIAGNOSIS — E785 Hyperlipidemia, unspecified: Secondary | ICD-10-CM | POA: Diagnosis not present

## 2020-10-23 DIAGNOSIS — G8929 Other chronic pain: Secondary | ICD-10-CM

## 2020-10-23 DIAGNOSIS — Z0000 Encounter for general adult medical examination without abnormal findings: Secondary | ICD-10-CM | POA: Diagnosis not present

## 2020-10-23 DIAGNOSIS — E538 Deficiency of other specified B group vitamins: Secondary | ICD-10-CM | POA: Diagnosis not present

## 2020-10-23 DIAGNOSIS — R739 Hyperglycemia, unspecified: Secondary | ICD-10-CM | POA: Diagnosis not present

## 2020-10-23 LAB — CBC WITH DIFFERENTIAL/PLATELET
Basophils Absolute: 0 10*3/uL (ref 0.0–0.1)
Basophils Relative: 0.4 % (ref 0.0–3.0)
Eosinophils Absolute: 0.2 10*3/uL (ref 0.0–0.7)
Eosinophils Relative: 1.7 % (ref 0.0–5.0)
HCT: 40.4 % (ref 36.0–46.0)
Hemoglobin: 13.7 g/dL (ref 12.0–15.0)
Lymphocytes Relative: 26 % (ref 12.0–46.0)
Lymphs Abs: 2.5 10*3/uL (ref 0.7–4.0)
MCHC: 33.8 g/dL (ref 30.0–36.0)
MCV: 88 fl (ref 78.0–100.0)
Monocytes Absolute: 0.5 10*3/uL (ref 0.1–1.0)
Monocytes Relative: 5.4 % (ref 3.0–12.0)
Neutro Abs: 6.4 10*3/uL (ref 1.4–7.7)
Neutrophils Relative %: 66.5 % (ref 43.0–77.0)
Platelets: 236 10*3/uL (ref 150.0–400.0)
RBC: 4.59 Mil/uL (ref 3.87–5.11)
RDW: 13.2 % (ref 11.5–15.5)
WBC: 9.6 10*3/uL (ref 4.0–10.5)

## 2020-10-23 LAB — COMPREHENSIVE METABOLIC PANEL
ALT: 17 U/L (ref 0–35)
AST: 16 U/L (ref 0–37)
Albumin: 4.1 g/dL (ref 3.5–5.2)
Alkaline Phosphatase: 53 U/L (ref 39–117)
BUN: 12 mg/dL (ref 6–23)
CO2: 30 mEq/L (ref 19–32)
Calcium: 9.4 mg/dL (ref 8.4–10.5)
Chloride: 100 mEq/L (ref 96–112)
Creatinine, Ser: 0.63 mg/dL (ref 0.40–1.20)
GFR: 106.92 mL/min (ref >60.00)
Glucose, Bld: 89 mg/dL (ref 70–99)
Potassium: 4.4 mEq/L (ref 3.5–5.1)
Sodium: 139 mEq/L (ref 135–145)
Total Bilirubin: 0.3 mg/dL (ref 0.2–1.2)
Total Protein: 6.6 g/dL (ref 6.0–8.3)

## 2020-10-23 LAB — LIPID PANEL
Cholesterol: 206 mg/dL — ABNORMAL HIGH (ref 0–200)
HDL: 55.8 mg/dL (ref >39.00)
LDL Cholesterol: 129 mg/dL — ABNORMAL HIGH (ref 0–99)
NonHDL: 150.26
Total CHOL/HDL Ratio: 4
Triglycerides: 105 mg/dL (ref 0.0–149.0)
VLDL: 21 mg/dL (ref 0.0–40.0)

## 2020-10-23 LAB — FERRITIN: Ferritin: 49.8 ng/mL (ref 10.0–291.0)

## 2020-10-23 LAB — HEMOGLOBIN A1C: Hgb A1c MFr Bld: 5.6 % (ref 4.6–6.5)

## 2020-10-23 LAB — VITAMIN D 25 HYDROXY (VIT D DEFICIENCY, FRACTURES): VITD: 63.38 ng/mL (ref 30.00–100.00)

## 2020-10-23 LAB — VITAMIN B12: Vitamin B-12: 457 pg/mL (ref 211–911)

## 2020-10-23 NOTE — Addendum Note (Signed)
Addended by: Janann Colonel on: 10/23/2020 01:52 PM   Modules accepted: Orders

## 2020-10-23 NOTE — Progress Notes (Signed)
Cristle A Meadors DOB: 1976/02/05 Encounter date: 10/23/2020  This is a 45 y.o. female who presents for complete physical   History of present illness/Additional concerns: Depression: Follows with Dr. Toy Care. Has history of depression for at least 13 years. Had long term follow up and tx in [place] before moving here. Went off meds in 2014; but depression flared again in 2015 and ended up back on medication. Lamictal and ritalin are both for treating depression and has been on this combination since 2010. States today that mood has been doing better. Sertraline added in December and that has helped. Has f/u in 2 weeks with her.   She is down 30lbs from last year. Has been doing weight watchers, being more active so working to squeeze in time for this. She does feel better with this loss. Feels better when she eats well. Walked first [place] in November.   Follows with Dr. Sabra Heck for gynecologic needs.  Last visit was 11/2019.   Mammogram was supposed to be Tuesday, but got rescheduled for April.  Saw derm last year - jolene (at [facility]) Due for dental visit.   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   Past Medical History:  Diagnosis Date  . Abnormal Pap smear of cervix   . Arthritis   . Atypical nevus 01/20/2015   left upper calf (moderate)  . Carpal tunnel syndrome 10/05/2009  . Cystocele   . Depression   . Dysplasia of skin   . Gene mutation 10/05/2017   On screening for hemochromatosis due to [place] 2018 "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 Results for S65C were negative.  [name] GERD (gastroesophageal reflux disease)   . Migraine headache with aura 2012   hx numb face with migraine  . Obesity   . Osteoarthritis of both knees   . Tubular adenoma 05/12/2009   Past Surgical History:  Procedure Laterality Date  . ANTERIOR AND POSTERIOR REPAIR N/A 09/22/2015   Procedure: ANTERIOR (CYSTOCELE) AND  POSTERIOR REPAIR (RECTOCELE) ;  Surgeon: Nunzio Cobbs, MD;  Location: [facility];  Service: Gynecology;  Laterality: N/A;  . BILATERAL SALPINGECTOMY Bilateral 01/12/2015   Procedure: BILATERAL SALPINGECTOMY;  Surgeon: Megan Salon, MD;  Location: [facility];  Service: Gynecology;  Laterality: Bilateral;  . BLADDER SUSPENSION N/A 09/22/2015   Procedure: TRANSVAGINAL TAPE (TVT) PROCEDURE;  Surgeon: Nunzio Cobbs, MD;  Location: [facility];  Service: Gynecology;  Laterality: N/A;  . CARPAL TUNNEL RELEASE Right 07/09/2019   Procedure: CARPAL TUNNEL RELEASE;  Surgeon: Leanora Cover, MD;  Location: [facility];  Service: Orthopedics;  Laterality: Right;  . CHOLECYSTECTOMY  2008  . COLONOSCOPY    . CYSTOSCOPY N/A 01/12/2015   Procedure: CYSTOSCOPY;  Surgeon: Megan Salon, MD;  Location: [facility];  Service: Gynecology;  Laterality: N/A;  . CYSTOSCOPY N/A 09/22/2015   Procedure: CYSTOSCOPY;  Surgeon: Nunzio Cobbs, MD;  Location: [facility];  Service: Gynecology;  Laterality: N/A;  . endometrial biopsy     neg per patient  . LEG SKIN LESION  BIOPSY / EXCISION Left 01/20/2015   [facility]-moderate dysplasia per patient  . ORIF FINGER FRACTURE    . ROBOTIC ASSISTED TOTAL HYSTERECTOMY N/A 01/12/2015   Procedure: ROBOTIC ASSISTED TOTAL HYSTERECTOMY;  Surgeon: Megan Salon, MD;  Location: [facility];  Service: Gynecology;  Laterality: N/A;  . uterine ablation  2/13   No Known Allergies Current Meds  Medication Sig  . Dexmethylphenidate HCl 30 MG CP24 Take 1 capsule by mouth every morning.  . Diclofenac Sodium (PENNSAID) 2 % SOLN Place 1 application onto the skin 2 (two) times daily.  [name] ibuprofen (ADVIL) 200 MG tablet Take 200 mg by mouth every 6 (six) hours as needed.  . lamoTRIgine (LAMICTAL) 100 MG tablet Take 1 tablet by mouth daily.  . sertraline (ZOLOFT) 25 MG tablet Take 25 mg by mouth daily.  [name] VITAMIN D PO Take 5,000 Int'l Units by mouth.   Social History    Tobacco Use  . Smoking status: Former Smoker    Packs/day: 1.00    Years: 15.00    Pack years: 15.00    Types: Cigarettes    Quit date: 09/22/2000    Years since quitting: 20.0  . Smokeless tobacco: Never Used  Substance Use Topics  . Alcohol use: Not Currently   Family History  Problem Relation Age of Onset  . Arthritis Mother   . Hypertension Mother   . Hyperlipidemia Mother   . Stroke Mother 86  . Other Mother        hereditary chromatosis carrier  . Arthritis Father   . Hyperlipidemia Father   . Hypertension Father   . Diabetes Father   . Squamous cell carcinoma Father   . Other Father        herediatry chromatosis carrier  . Melanoma Maternal Aunt   . Melanoma Paternal Uncle   . Arthritis Maternal Grandmother   . Cirrhosis Maternal Grandmother   . Alcohol abuse Maternal Grandfather   . Squamous cell carcinoma Maternal Grandfather   . Arthritis Paternal Grandmother   . Melanoma Paternal Grandmother   . Alcohol abuse Paternal Grandfather   . Squamous cell carcinoma Maternal Aunt   . Colon cancer Paternal Uncle   . Liver cancer Paternal Uncle   . Hepatitis C Paternal Uncle   . Cirrhosis Paternal Uncle   . Hemochromatosis Paternal Uncle      Review of Systems  Constitutional: Negative for activity change, appetite change, chills, fatigue, fever and unexpected weight change.  HENT: Negative for congestion, ear pain, hearing loss, sinus pressure, sinus pain, sore throat and trouble swallowing.   Eyes: Negative for pain and visual disturbance.  Respiratory: Negative for cough, chest tightness, shortness of breath and wheezing.   Cardiovascular: Negative for chest pain, palpitations and leg swelling.  Gastrointestinal: Negative for abdominal pain, blood in stool, constipation, diarrhea, nausea and vomiting.  Genitourinary: Negative for difficulty urinating and menstrual problem.  Musculoskeletal: Positive for arthralgias (left shoulder pain- sometimes with  radiation.feels pain from shoulder into elbow; deep ache. improves with 800mg  motrin in a couple of days). Negative for back pain.  Skin: Negative for rash.  Neurological: Negative for dizziness, weakness, numbness and headaches.  Hematological: Negative for adenopathy. Does not bruise/bleed easily.  Psychiatric/Behavioral: Negative for sleep disturbance and suicidal ideas. The patient is not nervous/anxious.     CBC:  Lab Results  Component Value Date   WBC 8.1 10/01/2019   HGB 12.9 10/01/2019   HGB 13.1 05/26/2017   HCT 39.1 10/01/2019   HCT 40.2 05/26/2017   MCH 29.2 12/11/2018   MCHC 33.1 10/01/2019   RDW 13.5 10/01/2019   RDW 13.5 05/26/2017   PLT 260.0 10/01/2019   PLT 256 05/26/2017   MPV 10.8 10/13/2017   CMP: Lab Results  Component Value Date   NA 137 10/01/2019   K  4.6 10/01/2019   CL 104 10/01/2019   CO2 27 10/01/2019   ANIONGAP 7 12/11/2018   GLUCOSE 110 (H) 10/01/2019   BUN 15 10/01/2019   CREATININE 0.67 10/01/2019   CREATININE 0.82 10/13/2017   GFRAA >60 12/11/2018   CALCIUM 9.0 10/01/2019   PROT 6.1 10/01/2019   BILITOT 0.3 10/01/2019   ALKPHOS 55 10/01/2019   ALT 36 (H) 10/01/2019   AST 26 10/01/2019   LIPID: Lab Results  Component Value Date   CHOL 182 10/01/2019   TRIG 132.0 10/01/2019   HDL 48.20 10/01/2019   LDLCALC 108 (H) 10/01/2019    Objective:  BP 110/78 (BP Location: Left Arm, Patient Position: Sitting, Cuff Size: Large)   Pulse 68   Temp 98.4 F (36.9 C) (Oral)   Ht 5' 5.25" (1.657 m)   Wt 218 lb 9.6 oz (99.2 kg)   LMP 12/24/2014   SpO2 96%   BMI 36.10 kg/m   Weight: 218 lb 9.6 oz (99.2 kg)   BP Readings from Last 3 Encounters:  10/23/20 110/78  12/13/19 110/72  07/09/19 (!) 149/86   Wt Readings from Last 3 Encounters:  10/23/20 218 lb 9.6 oz (99.2 kg)  12/13/19 228 lb (103.4 kg)  09/13/19 247 lb (112 kg)    Physical Exam Constitutional:      General: She is not in acute distress.    Appearance: She is  well-developed and well-nourished.  HENT:     Head: Normocephalic and atraumatic.     Right Ear: External ear normal.     Left Ear: External ear normal.     Mouth/Throat:     Mouth: Oropharynx is clear and moist.     Pharynx: No oropharyngeal exudate.  Eyes:     Conjunctiva/sclera: Conjunctivae normal.     Pupils: Pupils are equal, round, and reactive to light.  Neck:     Thyroid: No thyromegaly.  Cardiovascular:     Rate and Rhythm: Normal rate and regular rhythm.     Heart sounds: Normal heart sounds. No murmur heard. No friction rub. No gallop.   Pulmonary:     Effort: Pulmonary effort is normal.     Breath sounds: Normal breath sounds.  Abdominal:     General: Bowel sounds are normal. There is no distension.     Palpations: Abdomen is soft. There is no mass.     Tenderness: There is no abdominal tenderness. There is no guarding.     Hernia: No hernia is present.  Musculoskeletal:        General: No tenderness, deformity or edema. Normal range of motion.     Cervical back: Normal range of motion and neck supple.     Comments: Lateral shoulder pain with hawkins; pain with supraspinatus testing, spasm trap left.   Lymphadenopathy:     Cervical: No cervical adenopathy.  Skin:    General: Skin is warm and dry.     Findings: No rash.  Neurological:     Mental Status: She is alert and oriented to person, place, and time.     Deep Tendon Reflexes: Strength normal. Reflexes normal.     Reflex Scores:      Tricep reflexes are 2+ on the right side and 2+ on the left side.      Bicep reflexes are 2+ on the right side and 2+ on the left side.      Brachioradialis reflexes are 2+ on the right side and 2+ on the left side.  Patellar reflexes are 2+ on the right side and 2+ on the left side. Psychiatric:        Mood and Affect: Mood and affect normal.        Speech: Speech normal.        Behavior: Behavior normal.        Thought Content: Thought content normal.      Assessment/Plan: Health Maintenance Due  Topic Date Due  . Hepatitis C Screening  Never done   Health Maintenance reviewed - up to date.  1. Preventative health care Keep up with great work losing weight, walking regularly. - Hepatitis C antibody; Future  2. Hyperlipidemia, unspecified hyperlipidemia type Will recheck. - Comprehensive metabolic panel; Future - Lipid panel; Future  3. Hereditary hemochromatosis ([place]) - CBC with Differential/Platelet; Future - Ferritin; Future  4. Vitamin D deficiency - VITAMIN D 25 Hydroxy (Vit-D Deficiency, Fractures); Future  5. Hyperglycemia - Hemoglobin A1c; Future  6. B12 deficiency - Vitamin B12; Future  7. Left shoulder pain Getting xray today; follow up pending this.  Return in about 1 year (around 10/23/2021) for physical exam.  Micheline Rough, MD

## 2020-10-26 LAB — HEPATITIS C ANTIBODY
Hepatitis C Ab: NONREACTIVE
SIGNAL TO CUT-OFF: 0.04 (ref <1.00)

## 2020-11-06 ENCOUNTER — Encounter: Payer: PRIVATE HEALTH INSURANCE | Admitting: Family Medicine

## 2020-12-08 ENCOUNTER — Other Ambulatory Visit: Payer: Self-pay

## 2020-12-08 ENCOUNTER — Ambulatory Visit
Admission: RE | Admit: 2020-12-08 | Discharge: 2020-12-08 | Disposition: A | Discharge status: Home / Self Care | Payer: PRIVATE HEALTH INSURANCE | Source: Ambulatory Visit | Attending: Family Medicine | Admitting: Family Medicine

## 2020-12-08 DIAGNOSIS — Z1231 Encounter for screening mammogram for malignant neoplasm of breast: Secondary | ICD-10-CM

## 2021-02-19 ENCOUNTER — Ambulatory Visit: Payer: PRIVATE HEALTH INSURANCE

## 2021-07-01 ENCOUNTER — Ambulatory Visit (INDEPENDENT_AMBULATORY_CARE_PROVIDER_SITE_OTHER): Payer: No Typology Code available for payment source | Admitting: Obstetrics & Gynecology

## 2021-07-01 ENCOUNTER — Encounter (HOSPITAL_BASED_OUTPATIENT_CLINIC_OR_DEPARTMENT_OTHER): Payer: Self-pay | Admitting: Obstetrics & Gynecology

## 2021-07-01 ENCOUNTER — Other Ambulatory Visit: Payer: Self-pay

## 2021-07-01 VITALS — BP 129/84 | HR 74 | Ht 66.0 in | Wt 234.2 lb

## 2021-07-01 DIAGNOSIS — G43909 Migraine, unspecified, not intractable, without status migrainosus: Secondary | ICD-10-CM | POA: Diagnosis not present

## 2021-07-01 DIAGNOSIS — Z90710 Acquired absence of both cervix and uterus: Secondary | ICD-10-CM

## 2021-07-01 DIAGNOSIS — Z01419 Encounter for gynecological examination (general) (routine) without abnormal findings: Secondary | ICD-10-CM

## 2021-07-01 NOTE — Progress Notes (Signed)
45 y.o. G76P2002 Married White or Caucasian female here for annual exam.  Doing well.  Denies vaginal bleeding.  Had recent non-covid URI.  Doesn't have any urinary leakage but was having some urinary urgency.  That is improved.    H/o hemochromatosis.  Having yearly ferritin levels.  Was 49.8.    Having some occasional hot flashes.  Feels she is managing this right now.  Patient's last menstrual period was 12/24/2014.          Sexually active: Yes.    The current method of family planning is status post hysterectomy.   Exercising: not regular Smoker:  no  Health Maintenance: Pap:  not indicated History of abnormal Pap:  remote hx MMG:  12/08/20 neg Colonoscopy:  09/13/16, hyperplastic polyp.  Follow up 10 year.   BMD:   not indicated Screening Labs: 10/2020   reports that she quit smoking about 20 years ago. Her smoking use included cigarettes. She has a 15.00 pack-year smoking history. She has never used smokeless tobacco. She reports that she does not currently use alcohol. She reports that she does not use drugs.  Past Medical History:  Diagnosis Date   Abnormal Pap smear of cervix    Arthritis    Atypical nevus 01/20/2015   left upper calf (moderate)   Carpal tunnel syndrome 10/05/2009   Cystocele    Depression    Dysplasia of skin    Gene mutation 10/05/2017   On screening for hemochromatosis due to [place] 2018 " 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  Results for S65C were negative.   GERD (gastroesophageal reflux disease)    Migraine headache with aura 2012   hx numb face with migraine   Obesity    Osteoarthritis of both knees    Tubular adenoma 05/12/2009    Past Surgical History:  Procedure Laterality Date   ANTERIOR AND POSTERIOR REPAIR N/A 09/22/2015   Procedure: ANTERIOR (CYSTOCELE) AND POSTERIOR REPAIR (RECTOCELE) ;  Surgeon: Nunzio Cobbs, MD;  Location: [facility];  Service: Gynecology;  Laterality: N/A;   BILATERAL SALPINGECTOMY Bilateral  01/12/2015   Procedure: BILATERAL SALPINGECTOMY;  Surgeon: Megan Salon, MD;  Location: [facility];  Service: Gynecology;  Laterality: Bilateral;   BLADDER SUSPENSION N/A 09/22/2015   Procedure: TRANSVAGINAL TAPE (TVT) PROCEDURE;  Surgeon: Nunzio Cobbs, MD;  Location: [facility];  Service: Gynecology;  Laterality: N/A;   CARPAL TUNNEL RELEASE Right 07/09/2019   Procedure: CARPAL TUNNEL RELEASE;  Surgeon: Leanora Cover, MD;  Location: [facility];  Service: Orthopedics;  Laterality: Right;   CHOLECYSTECTOMY  2008   COLONOSCOPY     CYSTOSCOPY N/A 01/12/2015   Procedure: CYSTOSCOPY;  Surgeon: Megan Salon, MD;  Location: [facility];  Service: Gynecology;  Laterality: N/A;   CYSTOSCOPY N/A 09/22/2015   Procedure: CYSTOSCOPY;  Surgeon: Nunzio Cobbs, MD;  Location: [facility];  Service: Gynecology;  Laterality: N/A;   endometrial biopsy     neg per patient   LEG SKIN LESION  BIOPSY / EXCISION Left 01/20/2015   [facility]-moderate dysplasia per patient   ORIF FINGER FRACTURE     ROBOTIC ASSISTED TOTAL HYSTERECTOMY N/A 01/12/2015   Procedure: ROBOTIC ASSISTED TOTAL HYSTERECTOMY;  Surgeon: Megan Salon, MD;  Location: [facility];  Service: Gynecology;  Laterality: N/A;   uterine ablation  2/13    Current Outpatient Medications  Medication Sig Dispense Refill   ibuprofen (ADVIL) 200  MG tablet Take 200 mg by mouth every 6 (six) hours as needed.     lamoTRIgine (LAMICTAL) 100 MG tablet Take 1 tablet by mouth daily.     methylphenidate 36 MG PO CR tablet Take 36 mg by mouth in the morning and at bedtime.     sertraline (ZOLOFT) 25 MG tablet Take 25 mg by mouth daily.     VITAMIN D PO Take 5,000 Int'l Units by mouth.     Dexmethylphenidate HCl 30 MG CP24 Take 1 capsule by mouth every morning. (Patient not taking: Reported on 07/01/2021)     Diclofenac Sodium (PENNSAID) 2 % SOLN Place 1 application onto the skin 2 (two) times daily. (Patient not taking: Reported on 07/01/2021)  112 g 2   No current facility-administered medications for this visit.    Family History  Problem Relation Age of Onset   Arthritis Mother    Hypertension Mother    Hyperlipidemia Mother    Stroke Mother 70   Other Mother        hereditary chromatosis carrier   Arthritis Father    Hyperlipidemia Father    Hypertension Father    Diabetes Father    Squamous cell carcinoma Father    Other Father        herediatry chromatosis carrier   Melanoma Maternal Aunt    Melanoma Paternal Uncle    Arthritis Maternal Grandmother    Cirrhosis Maternal Grandmother    Alcohol abuse Maternal Grandfather    Squamous cell carcinoma Maternal Grandfather    Arthritis Paternal Grandmother    Melanoma Paternal Grandmother    Alcohol abuse Paternal Grandfather    Squamous cell carcinoma Maternal Aunt    Colon cancer Paternal Uncle    Liver cancer Paternal Uncle    Hepatitis C Paternal Uncle    Cirrhosis Paternal Uncle    Hemochromatosis Paternal Uncle     Review of Systems  All other systems reviewed and are negative.  Exam:   BP 129/84 (BP Location: Right Arm, Patient Position: Sitting, Cuff Size: Large)   Pulse 74   Ht 5\' 6"  (1.676 m) Comment: reported  Wt 234 lb 3.2 oz (106.2 kg)   LMP 12/24/2014   BMI 37.80 kg/m   Height: 5\' 6"  (167.6 cm) (reported)  General appearance: alert, cooperative and appears stated age Head: Normocephalic, without obvious abnormality, atraumatic Neck: no adenopathy, supple, symmetrical, trachea midline and thyroid normal to inspection and palpation Lungs: clear to auscultation bilaterally Breasts: normal appearance, no masses or tenderness Heart: regular rate and rhythm Abdomen: soft, non-tender; bowel sounds normal; no masses,  no organomegaly Extremities: extremities normal, atraumatic, no cyanosis or edema Skin: Skin color, texture, turgor normal. No rashes or lesions Lymph nodes: Cervical, supraclavicular, and axillary nodes normal. No abnormal  inguinal nodes palpated Neurologic: Grossly normal   Pelvic: External genitalia:  no lesions              Urethra:  normal appearing urethra with no masses, tenderness or lesions              Bartholins and Skenes: normal                 Vagina: normal appearing vagina with normal color and no discharge, no lesions              Cervix: absent              Pap taken: No. Bimanual Exam:  Uterus:  uterus absent  Adnexa: no mass, fullness, tenderness               Rectovaginal: Confirms               Anus:  normal sphincter tone, no lesions  Chaperone, Octaviano Batty, CMA, was present for exam.  Assessment/Plan: 1. Well woman exam with routine gynecological exam - pap smear not indicated - MMG 12/08/2020 - colonoscopy 09/13/2016, hyperplastic polyp.  Follow up 10 years recommended - screening lab work 10/2020 - care gaps reviewed/updated  2. Hereditary hemochromatosis ([place]) - having ferritin level obtained yearly  3. Hx of hysterectomy - TLH/bilateral salpingectomy 3/16 with A&P repair later 1/17  4. Migraine without status migrainosus, not intractable, unspecified migraine type

## 2021-11-09 ENCOUNTER — Encounter: Payer: Self-pay | Admitting: Internal Medicine

## 2021-11-09 ENCOUNTER — Ambulatory Visit (INDEPENDENT_AMBULATORY_CARE_PROVIDER_SITE_OTHER): Payer: No Typology Code available for payment source | Admitting: Internal Medicine

## 2021-11-09 VITALS — BP 120/84 | HR 64 | Temp 98.5°F | Wt 234.0 lb

## 2021-11-09 DIAGNOSIS — Z23 Encounter for immunization: Secondary | ICD-10-CM

## 2021-11-09 DIAGNOSIS — R519 Headache, unspecified: Secondary | ICD-10-CM

## 2021-11-09 NOTE — Progress Notes (Signed)
? ? ? ?Acute office Visit ? ? ? ? ?This visit occurred during the SARS-CoV-2 public health emergency.  Safety protocols were in place, including screening questions prior to the visit, additional usage of staff PPE, and extensive cleaning of exam room while observing appropriate contact time as indicated for disinfecting solutions.  ? ? ?CC/Reason for Visit: Headache ? ?HPI: Diane Scott is a 46 y.o. female who is coming in today for the above mentioned reasons.  She has a history of migraine headaches, however these migraines feel very different than her usual migraines.  She has been having this dull headache for about a month it is daily, not severe, she will sometimes have a smell of burnt smoke when she has a headache.  She has been noting some watery eyes but really no other allergy symptoms.  No vision disturbances, no focal neurologic deficits. ? ?Past Medical/Surgical History: ?Past Medical History:  ?Diagnosis Date  ? Abnormal Pap smear of cervix   ? Arthritis   ? Atypical nevus 01/20/2015  ? left upper calf (moderate)  ? Carpal tunnel syndrome 10/05/2009  ? Cystocele   ? Depression   ? Dysplasia of skin   ? Gene mutation 10/05/2017  ? On screening for hemochromatosis due to [place] 2018 " 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  Results for S65C were negative.  ? GERD (gastroesophageal reflux disease)   ? Migraine headache with aura 2012  ? hx numb face with migraine  ? Obesity   ? Osteoarthritis of both knees   ? Tubular adenoma 05/12/2009  ? ? ?Past Surgical History:  ?Procedure Laterality Date  ? ANTERIOR AND POSTERIOR REPAIR N/A 09/22/2015  ? Procedure: ANTERIOR (CYSTOCELE) AND POSTERIOR REPAIR (RECTOCELE) ;  Surgeon: Nunzio Cobbs, MD;  Location: [facility];  Service: Gynecology;  Laterality: N/A;  ? BILATERAL SALPINGECTOMY Bilateral 01/12/2015  ? Procedure: BILATERAL SALPINGECTOMY;  Surgeon: Megan Salon, MD;  Location: [facility];  Service: Gynecology;  Laterality: Bilateral;  ?  BLADDER SUSPENSION N/A 09/22/2015  ? Procedure: TRANSVAGINAL TAPE (TVT) PROCEDURE;  Surgeon: Nunzio Cobbs, MD;  Location: [facility];  Service: Gynecology;  Laterality: N/A;  ? CARPAL TUNNEL RELEASE Right 07/09/2019  ? Procedure: CARPAL TUNNEL RELEASE;  Surgeon: Leanora Cover, MD;  Location: [facility];  Service: Orthopedics;  Laterality: Right;  ? CHOLECYSTECTOMY  2008  ? COLONOSCOPY    ? CYSTOSCOPY N/A 01/12/2015  ? Procedure: CYSTOSCOPY;  Surgeon: Megan Salon, MD;  Location: [facility];  Service: Gynecology;  Laterality: N/A;  ? CYSTOSCOPY N/A 09/22/2015  ? Procedure: CYSTOSCOPY;  Surgeon: Nunzio Cobbs, MD;  Location: [facility];  Service: Gynecology;  Laterality: N/A;  ? endometrial biopsy    ? neg per patient  ? LEG SKIN LESION  BIOPSY / EXCISION Left 01/20/2015  ? [facility]-moderate dysplasia per patient  ? ORIF FINGER FRACTURE    ? ROBOTIC ASSISTED TOTAL HYSTERECTOMY N/A 01/12/2015  ? Procedure: ROBOTIC ASSISTED TOTAL HYSTERECTOMY;  Surgeon: Megan Salon, MD;  Location: [facility];  Service: Gynecology;  Laterality: N/A;  ? uterine ablation  2/13  ? ? ?Social History: ? reports that she quit smoking about 21 years ago. Her smoking use included cigarettes. She has a 15.00 pack-year smoking history. She has never used smokeless tobacco. She reports that she does not currently use alcohol. She reports that she does not use drugs. ? ?Allergies: ?No Known Allergies ? ?  Family History:  ?Family History  ?Problem Relation Age of Onset  ? Arthritis Mother   ? Hypertension Mother   ? Hyperlipidemia Mother   ? Stroke Mother 33  ? Other Mother   ?     hereditary chromatosis carrier  ? Arthritis Father   ? Hyperlipidemia Father   ? Hypertension Father   ? Diabetes Father   ? Squamous cell carcinoma Father   ? Other Father   ?     herediatry chromatosis carrier  ? Melanoma Maternal Aunt   ? Melanoma Paternal Uncle   ? Arthritis Maternal Grandmother   ? Cirrhosis Maternal Grandmother   ?  Alcohol abuse Maternal Grandfather   ? Squamous cell carcinoma Maternal Grandfather   ? Arthritis Paternal Grandmother   ? Melanoma Paternal Grandmother   ? Alcohol abuse Paternal Grandfather   ? Squamous cell carcinoma Maternal Aunt   ? Colon cancer Paternal Uncle   ? Liver cancer Paternal Uncle   ? Hepatitis C Paternal Uncle   ? Cirrhosis Paternal Uncle   ? Hemochromatosis Paternal Uncle   ? ? ? ?Current Outpatient Medications:  ?  ibuprofen (ADVIL) 200 MG tablet, Take 200 mg by mouth every 6 (six) hours as needed., Disp: , Rfl:  ?  lamoTRIgine (LAMICTAL) 100 MG tablet, Take 1 tablet by mouth daily., Disp: , Rfl:  ?  methylphenidate 36 MG PO CR tablet, Take 36 mg by mouth in the morning and at bedtime., Disp: , Rfl:  ?  sertraline (ZOLOFT) 25 MG tablet, Take 25 mg by mouth daily., Disp: , Rfl:  ?  VITAMIN D PO, Take 5,000 Int'l Units by mouth., Disp: , Rfl:  ? ?Review of Systems:  ?Constitutional: Denies fever, chills, diaphoresis, appetite change and fatigue.  ?HEENT: Denies photophobia, eye pain, redness, hearing loss, ear pain, congestion, sore throat, rhinorrhea, sneezing, mouth sores, trouble swallowing.   ?Respiratory: Denies SOB, DOE, cough, chest tightness,  and wheezing.   ?Cardiovascular: Denies chest pain, palpitations and leg swelling.  ?Gastrointestinal: Denies nausea, vomiting, abdominal pain, diarrhea, constipation, blood in stool and abdominal distention.  ?Genitourinary: Denies dysuria, urgency, frequency, hematuria, flank pain and difficulty urinating.  ?Endocrine: Denies: hot or cold intolerance, sweats, changes in hair or nails, polyuria, polydipsia. ?Musculoskeletal: Denies myalgias, back pain, joint swelling, arthralgias and gait problem.  ?Skin: Denies pallor, rash and wound.  ?Neurological: Denies dizziness, seizures, syncope, weakness, light-headedness, numbness. ?Hematological: Denies adenopathy. Easy bruising, personal or family bleeding history  ?Psychiatric/Behavioral: Denies suicidal  ideation, mood changes, confusion, nervousness, sleep disturbance and agitation ? ? ? ?Physical Exam: ?Vitals:  ? 11/09/21 0835  ?BP: 120/84  ?Pulse: 64  ?Temp: 98.5 ?F (36.9 ?C)  ?TempSrc: Oral  ?SpO2: 98%  ?Weight: 234 lb (106.1 kg)  ? ? ?Body mass index is 37.77 kg/m?. ? ? ?Constitutional: NAD, calm, comfortable ?Eyes: PERRL, lids and conjunctivae normal, wears corrective lenses ?ENMT: Mucous membranes are moist.  ?Neurologic: Grossly intact and nonfocal ?Psychiatric: Normal judgment and insight. Alert and oriented x 3. Normal mood.  ? ? ?Impression and Plan: ? ?Sinus headache ?-Presumably a sinus headache given onset coincides with the spring allergy season. ?-Will try antihistamines, decongestants, guaifenesin.  OTC pain meds.  If no improvement she will call us back for further evaluation. ?-Do not believe we need brain imaging at this time. ? ?Time spent: 21 minutes reviewing chart, interviewing and examining patient and formulating plan of care. ? ? ? ?Lelon Frohlich, MD ?[facility] ? ? ?

## 2022-02-09 ENCOUNTER — Encounter: Payer: No Typology Code available for payment source | Admitting: Family

## 2022-02-11 ENCOUNTER — Ambulatory Visit (HOSPITAL_BASED_OUTPATIENT_CLINIC_OR_DEPARTMENT_OTHER)
Admission: RE | Admit: 2022-02-11 | Discharge: 2022-02-11 | Disposition: A | Discharge status: Home / Self Care | Payer: No Typology Code available for payment source | Source: Ambulatory Visit | Attending: Diagnostic Radiology | Admitting: Diagnostic Radiology

## 2022-02-11 DIAGNOSIS — Z1231 Encounter for screening mammogram for malignant neoplasm of breast: Secondary | ICD-10-CM | POA: Diagnosis present

## 2022-02-15 ENCOUNTER — Other Ambulatory Visit: Payer: Self-pay | Admitting: *Deleted

## 2022-02-15 ENCOUNTER — Other Ambulatory Visit: Payer: Self-pay | Admitting: Pediatric Radiology

## 2022-02-15 ENCOUNTER — Other Ambulatory Visit: Payer: Self-pay | Admitting: Family

## 2022-02-15 DIAGNOSIS — R928 Other abnormal and inconclusive findings on diagnostic imaging of breast: Secondary | ICD-10-CM

## 2022-02-16 ENCOUNTER — Encounter: Payer: Self-pay | Admitting: Family

## 2022-02-16 ENCOUNTER — Ambulatory Visit (INDEPENDENT_AMBULATORY_CARE_PROVIDER_SITE_OTHER): Payer: No Typology Code available for payment source | Admitting: Family

## 2022-02-16 VITALS — BP 102/70 | HR 87 | Temp 98.2°F | Ht 66.0 in | Wt 247.9 lb

## 2022-02-16 DIAGNOSIS — Z6841 Body Mass Index (BMI) 40.0 and over, adult: Secondary | ICD-10-CM | POA: Diagnosis not present

## 2022-02-16 DIAGNOSIS — E782 Mixed hyperlipidemia: Secondary | ICD-10-CM | POA: Diagnosis not present

## 2022-02-16 DIAGNOSIS — E559 Vitamin D deficiency, unspecified: Secondary | ICD-10-CM | POA: Diagnosis not present

## 2022-02-16 DIAGNOSIS — Z0000 Encounter for general adult medical examination without abnormal findings: Secondary | ICD-10-CM | POA: Diagnosis not present

## 2022-02-16 LAB — COMPREHENSIVE METABOLIC PANEL
ALT: 28 U/L (ref 0–35)
AST: 22 U/L (ref 0–37)
Albumin: 4 g/dL (ref 3.5–5.2)
Alkaline Phosphatase: 54 U/L (ref 39–117)
BUN: 16 mg/dL (ref 6–23)
CO2: 28 mEq/L (ref 19–32)
Calcium: 9.5 mg/dL (ref 8.4–10.5)
Chloride: 101 mEq/L (ref 96–112)
Creatinine, Ser: 0.73 mg/dL (ref 0.40–1.20)
GFR: 98.2 mL/min (ref >60.00)
Glucose, Bld: 126 mg/dL — ABNORMAL HIGH (ref 70–99)
Potassium: 4.4 mEq/L (ref 3.5–5.1)
Sodium: 137 mEq/L (ref 135–145)
Total Bilirubin: 0.4 mg/dL (ref 0.2–1.2)
Total Protein: 6.9 g/dL (ref 6.0–8.3)

## 2022-02-16 LAB — CBC WITH DIFFERENTIAL/PLATELET
Basophils Absolute: 0 10*3/uL (ref 0.0–0.1)
Basophils Relative: 0.3 % (ref 0.0–3.0)
Eosinophils Absolute: 0.4 10*3/uL (ref 0.0–0.7)
Eosinophils Relative: 3.6 % (ref 0.0–5.0)
HCT: 40.7 % (ref 36.0–46.0)
Hemoglobin: 13.7 g/dL (ref 12.0–15.0)
Lymphocytes Relative: 21.8 % (ref 12.0–46.0)
Lymphs Abs: 2.2 10*3/uL (ref 0.7–4.0)
MCHC: 33.6 g/dL (ref 30.0–36.0)
MCV: 87.3 fl (ref 78.0–100.0)
Monocytes Absolute: 0.5 10*3/uL (ref 0.1–1.0)
Monocytes Relative: 5 % (ref 3.0–12.0)
Neutro Abs: 7 10*3/uL (ref 1.4–7.7)
Neutrophils Relative %: 69.3 % (ref 43.0–77.0)
Platelets: 219 10*3/uL (ref 150.0–400.0)
RBC: 4.66 Mil/uL (ref 3.87–5.11)
RDW: 13.2 % (ref 11.5–15.5)
WBC: 10.2 10*3/uL (ref 4.0–10.5)

## 2022-02-16 LAB — LIPID PANEL
Cholesterol: 201 mg/dL — ABNORMAL HIGH (ref 0–200)
HDL: 56 mg/dL (ref >39.00)
LDL Cholesterol: 106 mg/dL — ABNORMAL HIGH (ref 0–99)
NonHDL: 145.16
Total CHOL/HDL Ratio: 4
Triglycerides: 197 mg/dL — ABNORMAL HIGH (ref 0.0–149.0)
VLDL: 39.4 mg/dL (ref 0.0–40.0)

## 2022-02-16 LAB — VITAMIN D 25 HYDROXY (VIT D DEFICIENCY, FRACTURES): VITD: 60.38 ng/mL (ref 30.00–100.00)

## 2022-02-16 LAB — TSH: TSH: 3.36 u[IU]/mL (ref 0.35–5.50)

## 2022-02-16 LAB — FERRITIN: Ferritin: 72.1 ng/mL (ref 10.0–291.0)

## 2022-02-16 NOTE — Progress Notes (Signed)
Complete physical exam  Patient: Diane Scott   DOB: 1975-11-07   46 y.o. Female  MRN: [id number]  Subjective:    Chief Complaint  Patient presents with   Annual Exam    Diane Scott is a 46 y.o. female who presents today for a complete physical exam. She reports consuming a general diet. The patient does not participate in regular exercise at present. She generally feels well. She reports sleeping well. She does not have additional problems to discuss today. She reports being off Concerta since April due to having difficulty getting the medication filled and stimulant shortage at the pharmacy.    Most recent fall risk assessment:    02/16/2022    8:25 AM  Fall Risk   Falls in the past year? 1  Number falls in past yr: 0  Injury with Fall? 1  Risk for fall due to : History of fall(s)  Follow up Falls evaluation completed     Most recent depression screenings:    02/16/2022    8:22 AM 11/09/2021    8:51 AM  PHQ 2/9 Scores  PHQ - 2 Score 1 0  PHQ- 9 Score 9 0    Vision:Not within last year   Patient Active Problem List   Diagnosis Date Noted   Vitamin D deficiency 09/13/2019   Hyperlipidemia 09/13/2019   Fever of unknown origin 10/2018   Bilateral hand pain 10/13/2017   Gene mutation 10/05/2017   BMI 40.0-44.9, adult ([place]) 10/05/2017   Hereditary hemochromatosis ([place]) 06/22/2017   OA (osteoarthritis) of knee 12/26/2013   Urinary urgency 12/23/2010   Headache, migraine 11/11/2010   Clinical depression 10/05/2009   Bilateral carpal tunnel syndrome 10/05/2009   Dysplastic nevi 10/05/2009   GERD (gastroesophageal reflux disease) 05/12/2009   Cholelithiasis 05/12/2009      Patient Care Team: Caren Macadam, MD (Inactive) as PCP - General (Family Medicine) Nevada Crane, MD as Consulting Physician (Psychiatry) Megan Salon, MD as Consulting Physician (Gynecology)   Outpatient Medications Prior to Visit  Medication Sig   lamoTRIgine (LAMICTAL) 100 MG  tablet Take 1 tablet by mouth daily.   sertraline (ZOLOFT) 25 MG tablet Take 25 mg by mouth daily.   [DISCONTINUED] ibuprofen (ADVIL) 200 MG tablet Take 200 mg by mouth every 6 (six) hours as needed.   [DISCONTINUED] methylphenidate 36 MG PO CR tablet Take 36 mg by mouth in the morning and at bedtime.   [DISCONTINUED] VITAMIN D PO Take 5,000 Int'l Units by mouth.   No facility-administered medications prior to visit.    Review of Systems  Psychiatric/Behavioral:         Off Concerta since April   All other systems reviewed and are negative.         Objective:     BP 102/70 (BP Location: Left Arm, Patient Position: Sitting, Cuff Size: Large)   Pulse 87   Temp 98.2 F (36.8 C) (Oral)   Ht '5\' 6"'  (1.676 m)   Wt 247 lb 14.4 oz (112.4 kg)   LMP 12/24/2014   SpO2 98%   BMI 40.01 kg/m  Wt Readings from Last 3 Encounters:  02/16/22 247 lb 14.4 oz (112.4 kg)  11/09/21 234 lb (106.1 kg)  07/01/21 234 lb 3.2 oz (106.2 kg)      Physical Exam Vitals and nursing note reviewed.  Constitutional:      Appearance: Normal appearance. She is obese.  HENT:     Head: Normocephalic and atraumatic.  Right Ear: Tympanic membrane, ear canal and external ear normal.     Left Ear: Tympanic membrane, ear canal and external ear normal.     Nose: Nose normal.     Mouth/Throat:     Mouth: Mucous membranes are moist.  Eyes:     Extraocular Movements: Extraocular movements intact.     Conjunctiva/sclera: Conjunctivae normal.     Pupils: Pupils are equal, round, and reactive to light.  Cardiovascular:     Rate and Rhythm: Normal rate and regular rhythm.  Pulmonary:     Effort: Pulmonary effort is normal.     Breath sounds: Normal breath sounds.  Abdominal:     General: Abdomen is flat. Bowel sounds are normal. There is no distension.     Palpations: Abdomen is soft. There is no mass.     Tenderness: There is no abdominal tenderness. There is no guarding or rebound.     Hernia: No hernia  is present.  Musculoskeletal:        General: Normal range of motion.     Cervical back: Normal range of motion and neck supple.  Skin:    General: Skin is warm and dry.  Neurological:     General: No focal deficit present.     Mental Status: She is alert and oriented to person, place, and time.  Psychiatric:        Mood and Affect: Mood normal.        Behavior: Behavior normal.      No results found for any visits on 02/16/22. Last vitamin D Lab Results  Component Value Date   VD25OH 63.38 10/23/2020        Assessment & Plan:    Routine Health Maintenance and Physical Exam  Immunization History  Administered Date(s) Administered   Influenza,inj,Quad PF,6+ Mos 06/17/2020   Influenza-Unspecified 05/22/2014, 06/05/2015, 05/22/2016, 05/25/2017, 05/22/2018, 06/24/2019, 06/20/2020, 05/11/2021   PFIZER(Purple Top)SARS-COV-2 Vaccination 08/20/2019, 09/10/2019, 05/21/2020, 05/15/2021   PPD Test 10/10/2007   Pfizer Covid-19 Vaccine Bivalent Booster 76yr & up 05/11/2021   Tdap 08/23/2011, 10/20/2011, 11/09/2021    Health Maintenance  Topic Date Due   PAP SMEAR-Modifier  12/10/2028 (Originally 10/22/2017)   INFLUENZA VACCINE  03/22/2022   MAMMOGRAM  02/12/2023   COLONOSCOPY (Pts 45-422yrInsurance coverage will need to be confirmed)  09/13/2026   TETANUS/TDAP  11/10/2031   COVID-19 Vaccine  Completed   Hepatitis C Screening  Completed   HIV Screening  Completed   HPV VACCINES  Aged Out    Discussed health benefits of physical activity, and encouraged her to engage in regular exercise appropriate for her age and condition.  Problem List Items Addressed This Visit     Vitamin D deficiency   Relevant Orders   Vitamin D, 25-hydroxy   Ferritin   Celiac HLA Rflx to Abs   CBC with Differential   Hyperlipidemia   Relevant Orders   Comprehensive metabolic panel   Lipid panel   BMI 40.0-44.9, adult (HCC)   Relevant Orders   Ferritin   Celiac HLA Rflx to Abs    Comprehensive metabolic panel   TSH   Other Visit Diagnoses     Routine general medical examination at a health care facility    -  Primary   Relevant Orders   CBC with Differential   Comprehensive metabolic panel      Return in about 1 year (around 02/17/2023). Encouraged a healthy diet, exercise. [facility] right diagnostic mammogram  performed. See dentist. Follow-back up with weight watchers. Labs obtained. Will notify patient pending results.     Kennyth Arnold, FNP

## 2022-02-16 NOTE — Patient Instructions (Signed)
Health Maintenance, Female Adopting a healthy lifestyle and getting preventive care are important in promoting health and wellness. Ask your health care provider about: The right schedule for you to have regular tests and exams. Things you can do on your own to prevent diseases and keep yourself healthy. What should I know about diet, weight, and exercise? Eat a healthy diet  Eat a diet that includes plenty of vegetables, fruits, low-fat dairy products, and lean protein. Do not eat a lot of foods that are high in solid fats, added sugars, or sodium. Maintain a healthy weight Body mass index (BMI) is used to identify weight problems. It estimates body fat based on height and weight. Your health care provider can help determine your BMI and help you achieve or maintain a healthy weight. Get regular exercise Get regular exercise. This is one of the most important things you can do for your health. Most adults should: Exercise for at least 150 minutes each week. The exercise should increase your heart rate and make you sweat (moderate-intensity exercise). Do strengthening exercises at least twice a week. This is in addition to the moderate-intensity exercise. Spend less time sitting. Even light physical activity can be beneficial. Watch cholesterol and blood lipids Have your blood tested for lipids and cholesterol at 47 years of age, then have this test every 5 years. Have your cholesterol levels checked more often if: Your lipid or cholesterol levels are high. You are older than 47 years of age. You are at high risk for heart disease. What should I know about cancer screening? Depending on your health history and family history, you may need to have cancer screening at various ages. This may include screening for: Breast cancer. Cervical cancer. Colorectal cancer. Skin cancer. Lung cancer. What should I know about heart disease, diabetes, and high blood pressure? Blood pressure and heart  disease High blood pressure causes heart disease and increases the risk of stroke. This is more likely to develop in people who have high blood pressure readings or are overweight. Have your blood pressure checked: Every 3-5 years if you are 18-39 years of age. Every year if you are 40 years old or older. Diabetes Have regular diabetes screenings. This checks your fasting blood sugar level. Have the screening done: Once every three years after age 40 if you are at a normal weight and have a low risk for diabetes. More often and at a younger age if you are overweight or have a high risk for diabetes. What should I know about preventing infection? Hepatitis B If you have a higher risk for hepatitis B, you should be screened for this virus. Talk with your health care provider to find out if you are at risk for hepatitis B infection. Hepatitis C Testing is recommended for: Everyone born from 1945 through 1965. Anyone with known risk factors for hepatitis C. Sexually transmitted infections (STIs) Get screened for STIs, including gonorrhea and chlamydia, if: You are sexually active and are younger than 47 years of age. You are older than 47 years of age and your health care provider tells you that you are at risk for this type of infection. Your sexual activity has changed since you were last screened, and you are at increased risk for chlamydia or gonorrhea. Ask your health care provider if you are at risk. Ask your health care provider about whether you are at high risk for HIV. Your health care provider may recommend a prescription medicine to help prevent HIV   infection. If you choose to take medicine to prevent HIV, you should first get tested for HIV. You should then be tested every 3 months for as long as you are taking the medicine. Pregnancy If you are about to stop having your period (premenopausal) and you may become pregnant, seek counseling before you get pregnant. Take 400 to 800  micrograms (mcg) of folic acid every day if you become pregnant. Ask for birth control (contraception) if you want to prevent pregnancy. Osteoporosis and menopause Osteoporosis is a disease in which the bones lose minerals and strength with aging. This can result in bone fractures. If you are 65 years old or older, or if you are at risk for osteoporosis and fractures, ask your health care provider if you should: Be screened for bone loss. Take a calcium or vitamin D supplement to lower your risk of fractures. Be given hormone replacement therapy (HRT) to treat symptoms of menopause. Follow these instructions at home: Alcohol use Do not drink alcohol if: Your health care provider tells you not to drink. You are pregnant, may be pregnant, or are planning to become pregnant. If you drink alcohol: Limit how much you have to: 0-1 drink a day. Know how much alcohol is in your drink. In the [place] one drink equals one 12 oz bottle of beer (355 mL), one 5 oz glass of wine (148 mL), or one 1 oz glass of hard liquor (44 mL). Lifestyle Do not use any products that contain nicotine or tobacco. These products include cigarettes, chewing tobacco, and vaping devices, such as e-cigarettes. If you need help quitting, ask your health care provider. Do not use street drugs. Do not share needles. Ask your health care provider for help if you need support or information about quitting drugs. General instructions Schedule regular health, dental, and eye exams. Stay current with your vaccines. Tell your health care provider if: You often feel depressed. You have ever been abused or do not feel safe at home. Summary Adopting a healthy lifestyle and getting preventive care are important in promoting health and wellness. Follow your health care provider's instructions about healthy diet, exercising, and getting tested or screened for diseases. Follow your health care provider's instructions on monitoring your  cholesterol and blood pressure. This information is not intended to replace advice given to you by your health care provider. Make sure you discuss any questions you have with your health care provider. Document Revised: 12/28/2020 Document Reviewed: 12/28/2020 Elsevier Patient Education  2023 Elsevier Inc.  American Heart Association (AHA) Exercise Recommendation  Being physically active is important to prevent heart disease and stroke, the nation's No. 1and No. 5killers. To improve overall cardiovascular health, we suggest at least 150 minutes per week of moderate exercise or 75 minutes per week of vigorous exercise (or a combination of moderate and vigorous activity). Thirty minutes a day, five times a week is an easy goal to remember. You will also experience benefits even if you divide your time into two or three segments of 10 to 15 minutes per day.  For people who would benefit from lowering their blood pressure or cholesterol, we recommend 40 minutes of aerobic exercise of moderate to vigorous intensity three to four times a week to lower the risk for heart attack and stroke.  Physical activity is anything that makes you move your body and burn calories.  This includes things like climbing stairs or playing sports. Aerobic exercises benefit your heart, and include walking, jogging, swimming or   biking. Strength and stretching exercises are best for overall stamina and flexibility.  The simplest, positive change you can make to effectively improve your heart health is to start walking. It's enjoyable, free, easy, social and great exercise. A walking program is flexible and boasts high success rates because people can stick with it. It's easy for walking to become a regular and satisfying part of life.   For Overall Cardiovascular Health: At least 30 minutes of moderate-intensity aerobic activity at least 5 days per week for a total of 150  OR  At least 25 minutes of vigorous aerobic  activity at least 3 days per week for a total of 75 minutes; or a combination of moderate- and vigorous-intensity aerobic activity  AND  Moderate- to high-intensity muscle-strengthening activity at least 2 days per week for additional health benefits.  For Lowering Blood Pressure and Cholesterol An average 40 minutes of moderate- to vigorous-intensity aerobic activity 3 or 4 times per week  What if I can't make it to the time goal? Something is always better than nothing! And everyone has to start somewhere. Even if you've been sedentary for years, today is the day you can begin to make healthy changes in your life. If you don't think you'll make it for 30 or 40 minutes, set a reachable goal for today. You can work up toward your overall goal by increasing your time as you get stronger. Don't let all-or-nothing thinking rob you of doing what you can every day.  Source:http://www.heart.org    

## 2022-02-24 ENCOUNTER — Ambulatory Visit
Admission: RE | Admit: 2022-02-24 | Discharge: 2022-02-24 | Disposition: A | Discharge status: Home / Self Care | Payer: No Typology Code available for payment source | Source: Ambulatory Visit | Attending: Family | Admitting: Family

## 2022-02-24 DIAGNOSIS — R928 Other abnormal and inconclusive findings on diagnostic imaging of breast: Secondary | ICD-10-CM

## 2022-03-03 LAB — CELIAC HLA RFLX TO ABS
DQ2 (DQA1 0501/0505,DQB1 02XX): POSITIVE
DQ8 (DQA1 03XX, DQB1 0302): NEGATIVE

## 2022-03-03 LAB — CELIAC AB TTG DGP TIGA
Antigliadin Abs, IgA: 4 units (ref 0–19)
Gliadin IgG: 1 units (ref 0–19)
IgA/Immunoglobulin A, Serum: 178 mg/dL (ref 87–352)
Tissue Transglut Ab: <2 U/mL (ref 0–5)
Transglutaminase IgA: <2 U/mL (ref 0–3)

## 2022-07-21 ENCOUNTER — Ambulatory Visit (INDEPENDENT_AMBULATORY_CARE_PROVIDER_SITE_OTHER): Payer: No Typology Code available for payment source | Admitting: Obstetrics & Gynecology

## 2022-07-21 ENCOUNTER — Encounter (HOSPITAL_BASED_OUTPATIENT_CLINIC_OR_DEPARTMENT_OTHER): Payer: Self-pay | Admitting: Obstetrics & Gynecology

## 2022-07-21 VITALS — BP 132/82 | HR 93 | Ht 66.0 in | Wt 258.2 lb

## 2022-07-21 DIAGNOSIS — Z90710 Acquired absence of both cervix and uterus: Secondary | ICD-10-CM

## 2022-07-21 DIAGNOSIS — Z01419 Encounter for gynecological examination (general) (routine) without abnormal findings: Secondary | ICD-10-CM | POA: Diagnosis not present

## 2022-07-21 DIAGNOSIS — G43909 Migraine, unspecified, not intractable, without status migrainosus: Secondary | ICD-10-CM

## 2022-07-21 NOTE — Progress Notes (Signed)
46 y.o. G37P2002 Married White or Caucasian female here for annual exam.  Doing well.  She is very busy at work.    Denies vaginal bleeding.  Bladder function is good.  Does not have any leakage.  Stopped sertraline and noticed a lot more symptoms.  Has restarted.    Patient's last menstrual period was 12/24/2014.          Sexually active: Yes.    The current method of family planning is status post hysterectomy.    Smoker:  no  Health Maintenance: Pap:  10/23/2014 Negative History of abnormal Pap:  remote hx MMG:  02/11/2022 Need additional images that showed cysts.   Colonoscopy:  09/13/2016, follow up 10 years BMD:  not indicated at this time. Screening Labs: 01/2022   reports that she quit smoking about 21 years ago. Her smoking use included cigarettes. She has a 15.00 pack-year smoking history. She has never used smokeless tobacco. She reports that she does not currently use alcohol. She reports that she does not use drugs.  Past Medical History:  Diagnosis Date   Abnormal Pap smear of cervix    Arthritis    Atypical nevus 01/20/2015   left upper calf (moderate)   Carpal tunnel syndrome 10/05/2009   Cystocele    Depression    Dysplasia of skin    Gene mutation 10/05/2017   On screening for hemochromatosis due to [place] 2018 " 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  Results for S65C were negative.   GERD (gastroesophageal reflux disease)    Migraine headache with aura 2012   hx numb face with migraine   Obesity    Osteoarthritis of both knees    Tubular adenoma 05/12/2009    Past Surgical History:  Procedure Laterality Date   ANTERIOR AND POSTERIOR REPAIR N/A 09/22/2015   Procedure: ANTERIOR (CYSTOCELE) AND POSTERIOR REPAIR (RECTOCELE) ;  Surgeon: Nunzio Cobbs, MD;  Location: [facility];  Service: Gynecology;  Laterality: N/A;   BILATERAL SALPINGECTOMY Bilateral 01/12/2015   Procedure: BILATERAL SALPINGECTOMY;  Surgeon: Megan Salon, MD;  Location: [facility];  Service: Gynecology;  Laterality: Bilateral;   BLADDER SUSPENSION N/A 09/22/2015   Procedure: TRANSVAGINAL TAPE (TVT) PROCEDURE;  Surgeon: Nunzio Cobbs, MD;  Location: [facility];  Service: Gynecology;  Laterality: N/A;   CARPAL TUNNEL RELEASE Right 07/09/2019   Procedure: CARPAL TUNNEL RELEASE;  Surgeon: Leanora Cover, MD;  Location: [facility];  Service: Orthopedics;  Laterality: Right;   CHOLECYSTECTOMY  2008   COLONOSCOPY     CYSTOSCOPY N/A 01/12/2015   Procedure: CYSTOSCOPY;  Surgeon: Megan Salon, MD;  Location: [facility];  Service: Gynecology;  Laterality: N/A;   CYSTOSCOPY N/A 09/22/2015   Procedure: CYSTOSCOPY;  Surgeon: Nunzio Cobbs, MD;  Location: [facility];  Service: Gynecology;  Laterality: N/A;   endometrial biopsy     neg per patient   LEG SKIN LESION  BIOPSY / EXCISION Left 01/20/2015   [facility]-moderate dysplasia per patient   ORIF FINGER FRACTURE     ROBOTIC ASSISTED TOTAL HYSTERECTOMY N/A 01/12/2015   Procedure: ROBOTIC ASSISTED TOTAL HYSTERECTOMY;  Surgeon: Megan Salon, MD;  Location: [facility];  Service: Gynecology;  Laterality: N/A;   uterine ablation  2/13    Current Outpatient Medications  Medication Sig Dispense Refill   amphetamine-dextroamphetamine (ADDERALL XR) 20 MG 24 hr capsule Take 20 mg by mouth 2 (two) times daily.  lamoTRIgine (LAMICTAL) 100 MG tablet Take 1 tablet by mouth daily.     sertraline (ZOLOFT) 25 MG tablet Take 25 mg by mouth daily.     No current facility-administered medications for this visit.    Family History  Problem Relation Age of Onset   Arthritis Mother    Hypertension Mother    Hyperlipidemia Mother    Stroke Mother 80   Other Mother        hereditary chromatosis carrier   Arthritis Father    Hyperlipidemia Father    Hypertension Father    Diabetes Father    Squamous cell carcinoma Father    Other Father        herediatry chromatosis carrier   Melanoma Maternal Aunt     Melanoma Paternal Uncle    Arthritis Maternal Grandmother    Cirrhosis Maternal Grandmother    Alcohol abuse Maternal Grandfather    Squamous cell carcinoma Maternal Grandfather    Arthritis Paternal Grandmother    Melanoma Paternal Grandmother    Alcohol abuse Paternal Grandfather    Squamous cell carcinoma Maternal Aunt    Colon cancer Paternal Uncle    Liver cancer Paternal Uncle    Hepatitis C Paternal Uncle    Cirrhosis Paternal Uncle    Hemochromatosis Paternal Uncle     ROS: Constitutional: negative Genitourinary:negative  Exam:   BP 132/82 (BP Location: Left Arm, Patient Position: Sitting, Cuff Size: Large)   Pulse 93   Ht '5\' 6"'$  (1.676 m) Comment: Reported  Wt 258 lb 3.2 oz (117.1 kg)   LMP 12/24/2014   BMI 41.67 kg/m   Height: '5\' 6"'$  (167.6 cm) (Reported)  General appearance: alert, cooperative and appears stated age Head: Normocephalic, without obvious abnormality, atraumatic Neck: no adenopathy, supple, symmetrical, trachea midline and thyroid normal to inspection and palpation Lungs: clear to auscultation bilaterally Breasts: normal appearance, no masses or tenderness Heart: regular rate and rhythm Abdomen: soft, non-tender; bowel sounds normal; no masses,  no organomegaly Extremities: extremities normal, atraumatic, no cyanosis or edema Skin: Skin color, texture, turgor normal. No rashes or lesions Lymph nodes: Cervical, supraclavicular, and axillary nodes normal. No abnormal inguinal nodes palpated Neurologic: Grossly normal   Pelvic: External genitalia:  no lesions              Urethra:  normal appearing urethra with no masses, tenderness or lesions              Bartholins and Skenes: normal                 Vagina: normal appearing vagina with normal color and no discharge, no lesions              Cervix: absent              Pap taken: No. Bimanual Exam:  Uterus:  uterus absent              Adnexa: no mass, fullness, tenderness                Rectovaginal: Confirms               Anus:  normal sphincter tone, no lesions  Chaperone, Octaviano Batty, CMA, was present for exam.  Assessment/Plan: 1. Well woman exam with routine gynecological exam - Pap smear not indicated - Mammogram 02/2022 - Colonoscopy 2018.  Follow up 10 years. - Bone mineral density not indicated - lab work done with this summer - vaccines reviewed/updated  2. Hereditary  hemochromatosis ([place]) - having ferritin done yearly  3. Hx of hysterectomy  4. Migraine without status migrainosus, not intractable, unspecified migraine type

## 2022-09-13 ENCOUNTER — Encounter: Payer: Self-pay | Admitting: Internal Medicine

## 2022-09-13 ENCOUNTER — Ambulatory Visit: Payer: No Typology Code available for payment source | Admitting: Internal Medicine

## 2022-09-13 VITALS — BP 136/89 | HR 67 | Temp 98.7°F | Wt 245.6 lb

## 2022-09-13 DIAGNOSIS — R030 Elevated blood-pressure reading, without diagnosis of hypertension: Secondary | ICD-10-CM

## 2022-09-13 DIAGNOSIS — J069 Acute upper respiratory infection, unspecified: Secondary | ICD-10-CM

## 2022-09-13 NOTE — Progress Notes (Signed)
Established Patient Office Visit     CC/Reason for Visit: Elevated blood pressure  HPI: Diane Scott is a 47 y.o. female who is coming in today for the above mentioned reasons.  Beginning of the year she was diagnosed with COVID.  This past week she had significant ear pressure and thought she had an ear infection.  She went to urgent care.  There her blood pressure was recorded at 140/90.  Throughout the week she has been monitoring her blood pressure and yesterday was 156/116.  She made this appointment then.  This morning two in office measurements are 131/90 and 136/89.  She has a headache but this is chronic.  No chest pain, no shortness of breath, no focal neurologic deficits.  She has never been diagnosed with high blood pressure before.  She has been using Claritin-D.   Past Medical/Surgical History: Past Medical History:  Diagnosis Date   Abnormal Pap smear of cervix    Arthritis    Atypical nevus 01/20/2015   left upper calf (moderate)   Carpal tunnel syndrome 10/05/2009   Cystocele    Depression    Dysplasia of skin    Gene mutation 10/05/2017   On screening for hemochromatosis due to [place] 2018 " 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  Results for S65C were negative.   GERD (gastroesophageal reflux disease)    Migraine headache with aura 2012   hx numb face with migraine   Obesity    Osteoarthritis of both knees    Tubular adenoma 05/12/2009    Past Surgical History:  Procedure Laterality Date   ANTERIOR AND POSTERIOR REPAIR N/A 09/22/2015   Procedure: ANTERIOR (CYSTOCELE) AND POSTERIOR REPAIR (RECTOCELE) ;  Surgeon: Nunzio Cobbs, MD;  Location: [facility];  Service: Gynecology;  Laterality: N/A;   BILATERAL SALPINGECTOMY Bilateral 01/12/2015   Procedure: BILATERAL SALPINGECTOMY;  Surgeon: Megan Salon, MD;  Location: [facility];  Service: Gynecology;  Laterality: Bilateral;   BLADDER SUSPENSION N/A 09/22/2015   Procedure: TRANSVAGINAL TAPE  (TVT) PROCEDURE;  Surgeon: Nunzio Cobbs, MD;  Location: [facility];  Service: Gynecology;  Laterality: N/A;   CARPAL TUNNEL RELEASE Right 07/09/2019   Procedure: CARPAL TUNNEL RELEASE;  Surgeon: Leanora Cover, MD;  Location: [facility];  Service: Orthopedics;  Laterality: Right;   CHOLECYSTECTOMY  2008   COLONOSCOPY     CYSTOSCOPY N/A 01/12/2015   Procedure: CYSTOSCOPY;  Surgeon: Megan Salon, MD;  Location: [facility];  Service: Gynecology;  Laterality: N/A;   CYSTOSCOPY N/A 09/22/2015   Procedure: CYSTOSCOPY;  Surgeon: Nunzio Cobbs, MD;  Location: [facility];  Service: Gynecology;  Laterality: N/A;   endometrial biopsy     neg per patient   LEG SKIN LESION  BIOPSY / EXCISION Left 01/20/2015   [facility]-moderate dysplasia per patient   ORIF FINGER FRACTURE     ROBOTIC ASSISTED TOTAL HYSTERECTOMY N/A 01/12/2015   Procedure: ROBOTIC ASSISTED TOTAL HYSTERECTOMY;  Surgeon: Megan Salon, MD;  Location: [facility];  Service: Gynecology;  Laterality: N/A;   uterine ablation  2/13    Social History:  reports that she quit smoking about 21 years ago. Her smoking use included cigarettes. She has a 15.00 pack-year smoking history. She has never used smokeless tobacco. She reports that she does not currently use alcohol. She reports that she does not use drugs.  Allergies: No Known Allergies  Family  History:  Family History  Problem Relation Age of Onset   Arthritis Mother    Hypertension Mother    Hyperlipidemia Mother    Stroke Mother 5   Other Mother        hereditary chromatosis carrier   Arthritis Father    Hyperlipidemia Father    Hypertension Father    Diabetes Father    Squamous cell carcinoma Father    Other Father        herediatry chromatosis carrier   Melanoma Maternal Aunt    Melanoma Paternal Uncle    Arthritis Maternal Grandmother    Cirrhosis Maternal Grandmother    Alcohol abuse Maternal Grandfather    Squamous cell carcinoma  Maternal Grandfather    Arthritis Paternal Grandmother    Melanoma Paternal Grandmother    Alcohol abuse Paternal Grandfather    Squamous cell carcinoma Maternal Aunt    Colon cancer Paternal Uncle    Liver cancer Paternal Uncle    Hepatitis C Paternal Uncle    Cirrhosis Paternal Uncle    Hemochromatosis Paternal Uncle      Current Outpatient Medications:    amphetamine-dextroamphetamine (ADDERALL XR) 20 MG 24 hr capsule, Take 20 mg by mouth 2 (two) times daily., Disp: , Rfl:    ergocalciferol (VITAMIN D2) 1.25 MG (50000 UT) capsule, Take 50,000 Units by mouth once a week., Disp: , Rfl:    lamoTRIgine (LAMICTAL) 100 MG tablet, Take 1 tablet by mouth daily., Disp: , Rfl:    sertraline (ZOLOFT) 25 MG tablet, Take 25 mg by mouth daily., Disp: , Rfl:   Review of Systems:  Negative unless indicated in HPI.   Physical Exam: Vitals:   09/13/22 0757 09/13/22 0800  BP: (!) 131/90 136/89  Pulse: 67   Temp: 98.7 F (37.1 C)   TempSrc: Oral   SpO2: 97%   Weight: 245 lb 9.6 oz (111.4 kg)     Body mass index is 39.64 kg/m.   Physical Exam Vitals reviewed.  Constitutional:      Appearance: Normal appearance.  HENT:     Right Ear: Tympanic membrane, ear canal and external ear normal.     Left Ear: Tympanic membrane, ear canal and external ear normal.     Mouth/Throat:     Mouth: Mucous membranes are moist.     Pharynx: Oropharynx is clear.  Eyes:     Conjunctiva/sclera: Conjunctivae normal.     Pupils: Pupils are equal, round, and reactive to light.  Cardiovascular:     Rate and Rhythm: Normal rate and regular rhythm.  Pulmonary:     Effort: Pulmonary effort is normal.     Breath sounds: Normal breath sounds.  Neurological:     Mental Status: She is alert.      Impression and Plan:  Elevated BP without diagnosis of hypertension  Recent URI  -Blood pressure today, while elevated, is improved from recent measurements over the weekend. -I suspect decongestants may  have played a role. -I have advised ambulatory blood pressure monitoring and return in 6 to 8 weeks for follow-up to discuss.  Time spent:30 minutes reviewing chart, interviewing and examining patient and formulating plan of care.     Lelon Frohlich, MD [facility]

## 2022-11-01 ENCOUNTER — Ambulatory Visit: Payer: No Typology Code available for payment source | Admitting: Internal Medicine

## 2023-08-10 ENCOUNTER — Ambulatory Visit (HOSPITAL_BASED_OUTPATIENT_CLINIC_OR_DEPARTMENT_OTHER): Payer: No Typology Code available for payment source | Admitting: Obstetrics & Gynecology
